# Patient Record
Sex: Female | Born: 1935 | Race: White | Hispanic: No | Smoking: Never smoker
Health system: Southern US, Community
[De-identification: ages and names within clinical notes are randomized; demographics above are authoritative.]

## PROBLEM LIST (undated history)

## (undated) DIAGNOSIS — J189 Pneumonia, unspecified organism: Secondary | ICD-10-CM

## (undated) DIAGNOSIS — F0390 Unspecified dementia without behavioral disturbance: Secondary | ICD-10-CM

## (undated) DIAGNOSIS — I10 Essential (primary) hypertension: Secondary | ICD-10-CM

## (undated) DIAGNOSIS — E039 Hypothyroidism, unspecified: Secondary | ICD-10-CM

## (undated) DIAGNOSIS — E079 Disorder of thyroid, unspecified: Secondary | ICD-10-CM

## (undated) HISTORY — PX: ABDOMINAL HYSTERECTOMY: SHX81

## (undated) HISTORY — PX: APPENDECTOMY: SHX54

---

## 1998-09-10 ENCOUNTER — Encounter: Payer: Self-pay | Admitting: Gastroenterology

## 1998-09-10 ENCOUNTER — Ambulatory Visit (HOSPITAL_COMMUNITY): Admission: RE | Admit: 1998-09-10 | Discharge: 1998-09-10 | Payer: Self-pay | Admitting: Gastroenterology

## 2000-11-28 ENCOUNTER — Encounter: Payer: Self-pay | Admitting: Gastroenterology

## 2004-09-07 ENCOUNTER — Ambulatory Visit: Payer: Self-pay | Admitting: Hematology & Oncology

## 2004-10-20 ENCOUNTER — Ambulatory Visit: Payer: Self-pay | Admitting: Gastroenterology

## 2004-11-07 ENCOUNTER — Ambulatory Visit: Payer: Self-pay | Admitting: Gastroenterology

## 2005-02-11 ENCOUNTER — Emergency Department (HOSPITAL_COMMUNITY): Admission: EM | Admit: 2005-02-11 | Discharge: 2005-02-11 | Payer: Self-pay | Admitting: Family Medicine

## 2006-05-23 ENCOUNTER — Emergency Department (HOSPITAL_COMMUNITY): Admission: EM | Admit: 2006-05-23 | Discharge: 2006-05-23 | Payer: Self-pay | Admitting: Emergency Medicine

## 2006-06-07 ENCOUNTER — Encounter: Admission: RE | Admit: 2006-06-07 | Discharge: 2006-06-07 | Payer: Self-pay | Admitting: Internal Medicine

## 2006-06-12 ENCOUNTER — Encounter: Admission: RE | Admit: 2006-06-12 | Discharge: 2006-06-12 | Payer: Self-pay | Admitting: Internal Medicine

## 2007-05-06 ENCOUNTER — Encounter: Admission: RE | Admit: 2007-05-06 | Discharge: 2007-05-06 | Payer: Self-pay | Admitting: Internal Medicine

## 2007-08-01 ENCOUNTER — Emergency Department (HOSPITAL_COMMUNITY): Admission: EM | Admit: 2007-08-01 | Discharge: 2007-08-01 | Payer: Self-pay | Admitting: Family Medicine

## 2007-10-16 ENCOUNTER — Emergency Department (HOSPITAL_COMMUNITY): Admission: EM | Admit: 2007-10-16 | Discharge: 2007-10-16 | Payer: Self-pay | Admitting: Emergency Medicine

## 2007-12-11 ENCOUNTER — Encounter: Admission: RE | Admit: 2007-12-11 | Discharge: 2007-12-11 | Payer: Self-pay | Admitting: Internal Medicine

## 2009-07-11 ENCOUNTER — Emergency Department (HOSPITAL_COMMUNITY): Admission: EM | Admit: 2009-07-11 | Discharge: 2009-07-11 | Payer: Self-pay | Admitting: Emergency Medicine

## 2009-09-20 ENCOUNTER — Encounter (INDEPENDENT_AMBULATORY_CARE_PROVIDER_SITE_OTHER): Payer: Self-pay | Admitting: *Deleted

## 2010-05-03 ENCOUNTER — Telehealth: Payer: Self-pay | Admitting: Gastroenterology

## 2010-05-12 ENCOUNTER — Encounter (INDEPENDENT_AMBULATORY_CARE_PROVIDER_SITE_OTHER): Payer: Self-pay | Admitting: *Deleted

## 2010-05-29 ENCOUNTER — Emergency Department (HOSPITAL_COMMUNITY): Admission: EM | Admit: 2010-05-29 | Discharge: 2010-05-29 | Payer: Self-pay | Admitting: Family Medicine

## 2010-07-04 ENCOUNTER — Ambulatory Visit: Payer: Self-pay | Admitting: Gastroenterology

## 2010-07-04 DIAGNOSIS — I10 Essential (primary) hypertension: Secondary | ICD-10-CM

## 2010-07-04 DIAGNOSIS — Z860100 Personal history of colon polyps, unspecified: Secondary | ICD-10-CM | POA: Insufficient documentation

## 2010-09-21 ENCOUNTER — Ambulatory Visit: Payer: Self-pay | Admitting: Gastroenterology

## 2010-09-23 ENCOUNTER — Encounter: Payer: Self-pay | Admitting: Gastroenterology

## 2010-11-01 NOTE — Progress Notes (Signed)
Summary: Schedule Office Visit to Discuss Colonoscopy  Phone Note Outgoing Call Call back at [facility] Phone [phone]   Call placed by: Harlow Mares CMA Duncan Dull),  May 03, 2010 4:48 PM Call placed to: Patient Summary of Call: Left message on patients machine to call back. patient is due for an office visit  prior to scheduling her colonoscopy since she is over 21 Initial call taken by: Harlow Mares CMA Duncan Dull),  May 03, 2010 4:48 PM  Follow-up for Phone Call        NP3 scheduled for 07/04/2010 with Dr. Arlyce Dice. Follow-up by: Harlow Mares CMA Duncan Dull),  May 12, 2010 10:24 AM

## 2010-11-01 NOTE — Assessment & Plan Note (Signed)
Summary: discuss recall colon, due to age/lk   History of Present Illness Primary GI MD: Melvia Heaps MD [facility] Primary Sloane Junkin: [name], MD Chief Complaint: Discuss recall colon, pt states is not having any GI problems at this time.  History of Present Illness:   Kristie Gonzales is a pleasant 77 year old white female referred at the request of Dr. Chilton Si for recall colonoscopy.  She has a history of colon polyps and family history of colon cancer.  Her brother developed colon cancer in his 25s.  Kristie Gonzales is no GI complaints except for mild constipation.  She is without change in bowel habits, melena or hematochezia.   GI Review of Systems      Denies abdominal pain, acid reflux, belching, bloating, chest pain, dysphagia with liquids, dysphagia with solids, heartburn, loss of appetite, nausea, vomiting, vomiting blood, weight loss, and  weight gain.      Reports constipation.     Denies anal fissure, black tarry stools, change in bowel habit, diarrhea, diverticulosis, fecal incontinence, heme positive stool, hemorrhoids, irritable bowel syndrome, jaundice, light color stool, liver problems, rectal bleeding, and  rectal pain. Preventive Screening-Counseling & Management  Alcohol-Tobacco     Smoking Status: never      Drug Use:  no.      Current Medications (verified): 1)  Levothyroxine Sodium 100 Mcg Tabs (Levothyroxine Sodium) .... One Tablet By Mouth Once Daily 2)  Hydrochlorothiazide 25 Mg Tabs (Hydrochlorothiazide) .... One Tablet By Mouth Once Daily  Allergies (verified): 1)  ! Sulfa  Past History:  Past Medical History: Adenomatous Polyps 2006 Diverticulosis Asthma Hyperlipidemia Hypertension Hypothyroidism  Past Surgical History: Appendectomy Hysterectomy  Family History: Family History of Colon Cancer:Brother Family History of Diabetes: Son Family History of Heart Disease: Sons Family History of Kidney Disease:Son  Social  History: Widowed Retired Patient has never smoked.  Alcohol Use - no Daily Caffeine Use Illicit Drug Use - no Smoking Status:  never Drug Use:  no  Review of Systems       The patient complains of hearing problems, shortness of breath, and urination changes/pain.  The patient denies allergy/sinus, anemia, anxiety-new, arthritis/joint pain, back pain, blood in urine, breast changes/lumps, change in vision, confusion, cough, coughing up blood, depression-new, fainting, fatigue, fever, headaches-new, heart murmur, heart rhythm changes, itching, menstrual pain, muscle pains/cramps, night sweats, nosebleeds, pregnancy symptoms, skin rash, sleeping problems, sore throat, swelling of feet/legs, swollen lymph glands, thirst - excessive , urination - excessive , urine leakage, vision changes, and voice change.         All other systems were reviewed and were negative   Vital Signs:  Patient profile:   77 year old female Height:      63 inches Weight:      180.38 pounds BMI:     32.07 Pulse rate:   66 / minute Pulse rhythm:   regular BP sitting:   106 / 64  (left arm) Cuff size:   regular  Vitals Entered By: Christie Nottingham CMA Duncan Dull) (July 04, 2010 10:49 AM)  Physical Exam  Additional Exam:  On physical exam she is well-developed well-nourished female  skin: anicteric HEENT: normocephalic; PEERLA; no nasal or pharyngeal abnormalities neck: supple nodes: no cervical lymphadenopathy chest: clear to ausculatation and percussion heart: no murmurs, gallops, or rubs abd: soft, nontender; BS normoactive; no abdominal masses, tenderness, organomegaly rectal: deferred ext: no cynanosis, clubbing, edema skeletal: no deformities neuro: oriented x 3; no focal abnormalities    Impression & Recommendations:  Problem # 1:  PERSONAL HISTORY OF COLONIC POLYPS (ICD-V12.72)  Plan followup colonoscopy  Risks, alternatives, and complications of the procedure, including bleeding,  perforation, and possible need for surgery, were explained to the patient.  Patient's questions were answered.  Orders: Colonoscopy (Colon)  Problem # 2:  FM HX MALIGNANT NEOPLASM GASTROINTESTINAL TRACT (ICD-V16.0)  Orders: Colonoscopy (Colon)  Patient Instructions: 1)  Copy sent to : [name], MD 2)  Your Colonoscopy is scheduled for 08/23/2010 at 10:30am 3)  You can pick up your MoviPrep from your pharmacy today 4)  The medication list was reviewed and reconciled.  All changed / newly prescribed medications were explained.  A complete medication list was provided to the patient / caregiver. Prescriptions: MOVIPREP 100 GM  SOLR (PEG-KCL-NACL-NASULF-NA ASC-C) As per prep instructions.  #1 x 0   Entered by:   Merri Ray CMA (AAMA)   Authorized by:   Louis Meckel MD   Signed by:   Merri Ray CMA (AAMA) on 07/04/2010   Method used:   Electronically to        [id number] (retail)       [address] [place], [place]  [address]       Ph: [phone]       Fax: [phone]   RxID:   [id number]

## 2010-11-01 NOTE — Procedures (Signed)
Summary: EGD   EGD  Procedure date:  11/28/2000  Findings:      Findings: Esophagitis  Location: [facility]   Patient Name: Kristie Gonzales, Kristie Gonzales. MRN:  Procedure Procedures: Panendoscopy (EGD) CPT: 43235.  Personnel: Endoscopist: Barbette Hair. Arlyce Dice, MD.  Exam Location: Exam performed in Outpatient Clinic. Outpatient  Patient Consent: Procedure, Alternatives, Risks and Benefits discussed, consent obtained, from patient.  Indications Symptoms: Reflux symptoms  History  Pre-Exam Physical: Performed Nov 28, 2000  Cardio-pulmonary exam, HEENT exam, Abdominal exam, Extremity exam, Neurological exam, Mental status exam WNL.  Exam Exam Info: Maximum depth of insertion Duodenum, intended Duodenum. ASA Classification: II. Tolerance: excellent.  Sedation Meds: Fentanyl Versed 5 mg. Cetacaine Spray 1 sprays Demerol 50 mg. Robinul 0.2  Monitoring: BP and pulse monitoring done. Oximetry used. Supplemental O2 given  Findings ESOPHAGEAL INFLAMMATION: [place] Classification: Grade A. ICD9: Esophagitis: 530.10.  - MUCOSAL ABNORMALITY: Antrum. Erythematous mucosa.   Assessment Abnormal examination, see findings above.  Diagnoses: 530.10: Esophagitis.   Events  Unplanned Intervention: No unplanned interventions were required.  Unplanned Events: There were no complications. Plans Medication(s): Continue current medications.  Disposition: After procedure patient sent to recovery. After recovery patient sent home.  Scheduling: Office Visit, to [employer]. Arlyce Dice, MD, around Jan 09, 2001.    This report was created from the original endoscopy report, which was reviewed and signed by the above listed endoscopist.

## 2010-11-01 NOTE — Letter (Signed)
Summary: [facility] Instructions  [facility]  [address] [place], [place] [address]   Phone: [phone]  Fax: [phone]       Kristie Gonzales    1926/08/09    MRN: [id number]        Procedure Day /Date:TUESDAY 08/23/2010     Arrival Time:9:30AM     Procedure Time:10:30AM     Location of Procedure:                    X   [facility] (4th Floor)   PREPARATION FOR COLONOSCOPY WITH MOVIPREP   Starting 5 days prior to your procedure10/17/2011 do not eat nuts, seeds, popcorn, corn, beans, peas,  salads, or any raw vegetables.  Do not take any fiber supplements (e.g. Metamucil, Citrucel, and Benefiber).  THE DAY BEFORE YOUR PROCEDURE         DATE: 08/22/2010 DAY: MONDAY  1.  Drink clear liquids the entire day-NO SOLID FOOD  2.  Do not drink anything colored red or purple.  Avoid juices with pulp.  No orange juice.  3.  Drink at least 64 oz. (8 glasses) of fluid/clear liquids during the day to prevent dehydration and help the prep work efficiently.  CLEAR LIQUIDS INCLUDE: Water Jello Ice Popsicles Tea (sugar ok, no milk/cream) Powdered fruit flavored drinks Coffee (sugar ok, no milk/cream) Gatorade Juice: apple, white grape, white cranberry  Lemonade Clear bullion, consomm, broth Carbonated beverages (any kind) Strained chicken noodle soup Hard Candy                             4.  In the morning, mix first dose of MoviPrep solution:    Empty 1 Pouch A and 1 Pouch B into the disposable container    Add lukewarm drinking water to the top line of the container. Mix to dissolve    Refrigerate (mixed solution should be used within 24 hrs)  5.  Begin drinking the prep at 5:00 p.m. The MoviPrep container is divided by 4 marks.   Every 15 minutes drink the solution down to the next mark (approximately 8 oz) until the full liter is complete.   6.  Follow completed prep with 16 oz of clear liquid of your choice (Nothing red or purple).   Continue to drink clear liquids until bedtime.  7.  Before going to bed, mix second dose of MoviPrep solution:    Empty 1 Pouch A and 1 Pouch B into the disposable container    Add lukewarm drinking water to the top line of the container. Mix to dissolve    Refrigerate  THE DAY OF YOUR PROCEDURE      DATE:08/23/2010 DAY: TUESDAY  Beginning at 5:30a.m. (5 hours before procedure):         1. Every 15 minutes, drink the solution down to the next mark (approx 8 oz) until the full liter is complete.  2. Follow completed prep with 16 oz. of clear liquid of your choice.    3. You may drink clear liquids until 8:30AM (2 HOURS BEFORE PROCEDURE).   MEDICATION INSTRUCTIONS  Unless otherwise instructed, you should take regular prescription medications with a small sip of water   as early as possible the morning of your procedure.        OTHER INSTRUCTIONS  You will need a responsible adult at least 75 years of age to accompany you and drive you home.  This person must remain in the waiting room during your procedure.  Wear loose fitting clothing that is easily removed.  Leave jewelry and other valuables at home.  However, you may wish to bring a book to read or  an iPod/MP3 player to listen to music as you wait for your procedure to start.  Remove all body piercing jewelry and leave at home.  Total time from sign-in until discharge is approximately 2-3 hours.  You should go home directly after your procedure and rest.  You can resume normal activities the  day after your procedure.  The day of your procedure you should not:   Drive   Make legal decisions   Operate machinery   Drink alcohol   Return to work  You will receive specific instructions about eating, activities and medications before you leave.    The above instructions have been reviewed and explained to me by   _______________________    I fully understand and can verbalize these instructions  _____________________________ Date _________

## 2010-11-01 NOTE — Procedures (Signed)
Summary: colonoscopy   Colonoscopy  Procedure date:  11/07/2004  Findings:      Results: Polyp.  Results: Diverticulosis.       Location:  [facility].    Comments:      Repeat colonoscopy in 5 years.  Patient Name: Kristie Gonzales, Kristie Gonzales MRN:  Procedure Procedures: Colonoscopy CPT: [phone].    with Hot Biopsy(s)CPT: [id number].  Personnel: Endoscopist: Barbette Hair. Arlyce Dice, MD.  Patient Consent: Procedure, Alternatives, Risks and Benefits discussed, consent obtained, from patient.  Indications  Surveillance of: Adenomatous Polyp(s).  Increased Risk Screening: For family history of colorectal neoplasia, in  sibling  History  Current Medications: Patient is not currently taking Coumadin.  Pre-Exam Physical: Performed Nov 07, 2004. Cardio-pulmonary exam, HEENT exam , Abdominal exam WNL.  Exam Exam: Extent of exam reached: Cecum, extent intended: Cecum.  The cecum was identified by IC valve. Colon retroflexion performed. ASA Classification: II. Tolerance: good.  Monitoring: Pulse and BP monitoring, Oximetry used. Supplemental O2 given. at 2 Liters.  Colon Prep Used Miralax for colon prep. Prep results: good.  Sedation Meds: Patient assessed and found to be appropriate for moderate (conscious) sedation. Sedation was managed by the Endoscopist. Fentanyl 50 mcg. given IV. Versed 6 mg. given IV.  Findings POLYP: Descending Colon, Maximum size: 3 mm. Procedure:  hot biopsy, ICD9: Colon Polyps: 211.3.  - DIVERTICULOSIS: Descending Colon to Sigmoid Colon. ICD9: Diverticulosis: 562.10. Comments: Diffuse diverticular changes.  NORMAL EXAM: Cecum.  NORMAL EXAM: Rectum.   Assessment Abnormal examination, see findings above.  Diagnoses: 211.3: Colon Polyps.  562.10: Diverticulosis.   Events  Unplanned Interventions: No intervention was required.  Unplanned Events: There were no complications. Plans  Post Exam Instructions: Post sedation instructions given.    Patient Education: Patient given standard instructions for: Polyps. Diverticulosis.  Scheduling/Referral: Colonoscopy, to Barbette Hair. Arlyce Dice, MD, around Nov 07, 2009.    This report was created from the original endoscopy report, which was reviewed and signed by the above listed endoscopist.

## 2010-11-01 NOTE — Letter (Signed)
Summary: New Patient letter  [facility]  [address] [place], [place] [address]   Phone: [phone]  Fax: [phone]       05/12/2010 MRN: [id number]  [facility] [address] [place], [place]  [address]  Dear Ms. Laural Benes,  Welcome to the Gastroenterology Division at [facility].    You are scheduled to see Dr. Arlyce Dice on 07/04/2010 at 11:00am on the 3rd floor at [facility], [address]  We ask that you try to arrive at our office 15 minutes prior to your appointment time to allow for check-in.  We would like you to complete the enclosed self-administered evaluation form prior to your visit and bring it with you on the day of your appointment.  We will review it with you.  Also, please bring a complete list of all your medications or, if you prefer, bring the medication bottles and we will list them.  Please bring your insurance card so that we may make a copy of it.  If your insurance requires a referral to see a specialist, please bring your referral form from your primary care physician.  Co-payments are due at the time of your visit and may be paid by cash, check or credit card.     Your office visit will consist of a consult with your physician (includes a physical exam), any laboratory testing he/she may order, scheduling of any necessary diagnostic testing (e.g. x-ray, ultrasound, CT-scan), and scheduling of a procedure (e.g. Endoscopy, Colonoscopy) if required.  Please allow enough time on your schedule to allow for any/all of these possibilities.    If you cannot keep your appointment, please call [phone] to cancel or reschedule prior to your appointment date.  This allows [place] the opportunity to schedule an appointment for another patient in need of care.  If you do not cancel or reschedule by 5 p.m. the business day prior to your appointment date, you will be charged a $50.00 late cancellation/no-show fee.    Thank you for choosing  [facility] for your medical needs.  We appreciate the opportunity to care for you.  Please visit [place] at our website  to learn more about our practice.                     Sincerely,                                                             The Gastroenterology Division

## 2010-11-03 NOTE — Letter (Signed)
Summary: Patient Notice- Polyp Results  [facility]  [address] [place], [place] [address]   Phone: [phone]  Fax: [phone]        September 23, 2010 MRN: [id number]    [facility] [address] [place], [place]  [address]    Dear Ms. Laural Benes,  I am pleased to inform you that the colon polyp(s) removed during your recent colonoscopy was (were) found to be benign (no cancer detected) upon pathologic examination.  I recommend you have a repeat colonoscopy examination in 5_ years to look for recurrent polyps, as having colon polyps increases your risk for having recurrent polyps or even colon cancer in the future.  Should you develop new or worsening symptoms of abdominal pain, bowel habit changes or bleeding from the rectum or bowels, please schedule an evaluation with either your primary care physician or with me.  Additional information/recommendations:  __ No further action with gastroenterology is needed at this time. Please      follow-up with your primary care physician for your other healthcare      needs.  __ Please call [phone] to schedule a return visit to review your      situation.  __ Please keep your follow-up visit as already scheduled.  _x_ Continue treatment plan as outlined the day of your exam.  Please call us if you are having persistent problems or have questions about your condition that have not been fully answered at this time.  Sincerely,  Louis Meckel MD  This letter has been electronically signed by your physician.  Appended Document: Patient Notice- Polyp Results Letter Mailed

## 2010-11-03 NOTE — Procedures (Signed)
Summary: Colonoscopy  Patient: [name] Note: All result statuses are Final unless otherwise noted.  Tests: (1) Colonoscopy (COL)   COL Colonoscopy           DONE     [facility]     [address]     [place], [place]  [address]           COLONOSCOPY PROCEDURE REPORT           PATIENT:  Kristie, Gonzales  MR#:  [id number]     BIRTHDATE:  January 22, 1926, 84 yrs. old  GENDER:  female           ENDOSCOPIST:  Barbette Hair. Arlyce Dice, MD     Referred by:  [name], M.D.           PROCEDURE DATE:  09/21/2010     PROCEDURE:  Colonoscopy with snare polypectomy     ASA CLASS:  Class II     INDICATIONS:  1) screening  2) family history of colon cancer     (brother)  3) history of pre-cancerous (adenomatous) colon polyps;     index polypectomy 2006           MEDICATIONS:   Fentanyl 50 mcg IV, Versed 5 mg IV           DESCRIPTION OF PROCEDURE:   After the risks benefits and     alternatives of the procedure were thoroughly explained, informed     consent was obtained.  Digital rectal exam was performed and     revealed no abnormalities.   The LB [id number] endoscope     was introduced through the anus and advanced to the cecum, which     was identified by both the appendix and ileocecal valve, without     limitations.  The quality of the prep was excellent, using     MoviPrep.  The instrument was then slowly withdrawn as the colon     was fully examined.     <<PROCEDUREIMAGES>>           FINDINGS:  A sessile polyp was found in the ascending colon. It     was 10 mm in size. Polyp was snared without cautery. Retrieval was     successful (see image6). snare polyp  A sessile polyp was found in     the sigmoid colon. It was 12 mm in size. It was found 20 cm from     the point of entry. Polyp was snared without cautery. Retrieval     was successful (see image12). snare polyp  Moderate diverticulosis     was found in the sigmoid colon (see image1). Moderate to severe  diverticular changes  This was otherwise a normal examination of     the colon (see image2, image3, image7, image8, image9, and     image13).   Retroflexed views in the rectum revealed no     abnormalities.    The time to cecum =  4.0  minutes. The scope was     then withdrawn (time =  9.50  min) from the patient and the     procedure completed.           COMPLICATIONS:  None           ENDOSCOPIC IMPRESSION:     1) 10 mm sessile polyp in the ascending colon     2) 12 mm sessile polyp in the sigmoid colon     3)  Moderate diverticulosis in the sigmoid colon     4) Otherwise normal examination     RECOMMENDATIONS:     1) Await biopsy results           REPEAT EXAM:   You will receive a letter from Dr. Arlyce Dice in 1-2     weeks, after reviewing the final pathology, with followup     recommendations.           ______________________________     Barbette Hair Arlyce Dice, MD           CC:           n.     eSIGNED:   Barbette Hair. Kaplan at 09/21/2010 11:32 AM           Donalda Ewings, [phone]  Note: An exclamation mark (!) indicates a result that was not dispersed into the flowsheet. Document Creation Date: 09/21/2010 11:33 AM _______________________________________________________________________  (1) Order result status: Final Collection or observation date-time: 09/21/2010 11:26 Requested date-time:  Receipt date-time:  Reported date-time:  Referring Physician:   Ordering Physician: Melvia Heaps [phone]) Specimen Source:  Source: Launa Grill Order Number: [id number] Lab site:   Appended Document: Colonoscopy f/u colonoscopy in 5 years  Appended Document: Colonoscopy     Procedures Next Due Date:    Colonoscopy: 09/2015

## 2011-06-22 LAB — DIFFERENTIAL
Basophils Absolute: 0
Basophils Relative: 0
Eosinophils Absolute: 0.2
Eosinophils Relative: 2
Monocytes Absolute: 0.6
Monocytes Relative: 4

## 2011-06-22 LAB — CBC
HCT: 41.9
Hemoglobin: 14.4
MCHC: 34.3
MCV: 91.4
RBC: 4.58
RDW: 13.8

## 2011-06-22 LAB — I-STAT 8, (EC8 V) (CONVERTED LAB)
Acid-Base Excess: 1
Bicarbonate: 23.9
Glucose, Bld: 102 — ABNORMAL HIGH
Potassium: 4.5
TCO2: 25
pCO2, Ven: 32.3 — ABNORMAL LOW
pH, Ven: 7.478 — ABNORMAL HIGH

## 2011-06-22 LAB — URINALYSIS, ROUTINE W REFLEX MICROSCOPIC
Glucose, UA: NEGATIVE
Leukocytes, UA: NEGATIVE
Protein, ur: NEGATIVE
Specific Gravity, Urine: 1.027

## 2011-06-22 LAB — URINE MICROSCOPIC-ADD ON

## 2012-04-19 ENCOUNTER — Inpatient Hospital Stay (HOSPITAL_COMMUNITY)
Admission: EM | Admit: 2012-04-19 | Discharge: 2012-04-23 | DRG: 193 | Disposition: A | Discharge status: Skilled Nursing Facility | Payer: Medicare Other | Attending: Internal Medicine | Admitting: Internal Medicine

## 2012-04-19 ENCOUNTER — Ambulatory Visit (INDEPENDENT_AMBULATORY_CARE_PROVIDER_SITE_OTHER): Payer: Medicare Other | Admitting: Emergency Medicine

## 2012-04-19 ENCOUNTER — Ambulatory Visit: Payer: Medicare Other

## 2012-04-19 ENCOUNTER — Encounter (HOSPITAL_COMMUNITY): Payer: Self-pay

## 2012-04-19 VITALS — BP 120/72 | HR 109 | Temp 99.1°F | Resp 16 | Ht 62.0 in | Wt 157.6 lb

## 2012-04-19 DIAGNOSIS — J189 Pneumonia, unspecified organism: Secondary | ICD-10-CM

## 2012-04-19 DIAGNOSIS — G929 Unspecified toxic encephalopathy: Secondary | ICD-10-CM | POA: Diagnosis present

## 2012-04-19 DIAGNOSIS — I129 Hypertensive chronic kidney disease with stage 1 through stage 4 chronic kidney disease, or unspecified chronic kidney disease: Secondary | ICD-10-CM | POA: Diagnosis present

## 2012-04-19 DIAGNOSIS — Z86010 Personal history of colonic polyps: Secondary | ICD-10-CM

## 2012-04-19 DIAGNOSIS — G92 Toxic encephalopathy: Secondary | ICD-10-CM | POA: Diagnosis present

## 2012-04-19 DIAGNOSIS — N179 Acute kidney failure, unspecified: Secondary | ICD-10-CM | POA: Diagnosis present

## 2012-04-19 DIAGNOSIS — N183 Chronic kidney disease, stage 3 unspecified: Secondary | ICD-10-CM | POA: Diagnosis present

## 2012-04-19 DIAGNOSIS — R0902 Hypoxemia: Secondary | ICD-10-CM | POA: Diagnosis present

## 2012-04-19 DIAGNOSIS — R0989 Other specified symptoms and signs involving the circulatory and respiratory systems: Secondary | ICD-10-CM

## 2012-04-19 DIAGNOSIS — E039 Hypothyroidism, unspecified: Secondary | ICD-10-CM | POA: Diagnosis present

## 2012-04-19 DIAGNOSIS — Z882 Allergy status to sulfonamides status: Secondary | ICD-10-CM

## 2012-04-19 DIAGNOSIS — I10 Essential (primary) hypertension: Secondary | ICD-10-CM

## 2012-04-19 HISTORY — DX: Pneumonia, unspecified organism: J18.9

## 2012-04-19 HISTORY — DX: Hypothyroidism, unspecified: E03.9

## 2012-04-19 HISTORY — DX: Disorder of thyroid, unspecified: E07.9

## 2012-04-19 HISTORY — DX: Essential (primary) hypertension: I10

## 2012-04-19 LAB — CBC
HCT: 40.3 % (ref 36.0–46.0)
MCH: 31.1 pg (ref 26.0–34.0)
MCHC: 35 g/dL (ref 30.0–36.0)
MCHC: 34.9 g/dL (ref 30.0–36.0)
MCV: 89.6 fL (ref 78.0–100.0)
Platelets: 359 10*3/uL (ref 150–400)
RDW: 13.2 % (ref 11.5–15.5)
RDW: 13.3 % (ref 11.5–15.5)

## 2012-04-19 LAB — BASIC METABOLIC PANEL
BUN: 71 mg/dL — ABNORMAL HIGH (ref 6–23)
Creatinine, Ser: 2.78 mg/dL — ABNORMAL HIGH (ref 0.50–1.10)
GFR calc Af Amer: 17 mL/min — ABNORMAL LOW (ref >90)
GFR calc non Af Amer: 15 mL/min — ABNORMAL LOW (ref >90)
Potassium: 4.1 mEq/L (ref 3.5–5.1)

## 2012-04-19 LAB — LACTIC ACID, PLASMA: Lactic Acid, Venous: 1.4 mmol/L (ref 0.5–2.2)

## 2012-04-19 MED ORDER — AZITHROMYCIN 500 MG PO TABS
500.0000 mg | ORAL_TABLET | Freq: Every day | ORAL | Status: DC
Start: 2012-04-19 — End: 2012-04-23
  Administered 2012-04-20 – 2012-04-22 (×4): 500 mg via ORAL
  Filled 2012-04-19 (×6): qty 1

## 2012-04-19 MED ORDER — DEXTROSE 5 % IV SOLN: 500.0000 mg | Freq: Once | INTRAVENOUS | Status: DC

## 2012-04-19 MED ORDER — AZITHROMYCIN 250 MG PO TABS: ORAL_TABLET | ORAL | Status: DC

## 2012-04-19 MED ORDER — DEXTROSE 5 % IV SOLN
1.0000 g | Freq: Once | INTRAVENOUS | Status: AC
  Administered 2012-04-19: 1 g via INTRAVENOUS
  Filled 2012-04-19: qty 10

## 2012-04-19 MED ORDER — ENOXAPARIN SODIUM 30 MG/0.3ML ~~LOC~~ SOLN
30.0000 mg | SUBCUTANEOUS | Status: DC
  Administered 2012-04-20 – 2012-04-23 (×4): 30 mg via SUBCUTANEOUS
  Filled 2012-04-19 (×4): qty 0.3

## 2012-04-19 MED ORDER — DEXTROSE 5 % IV SOLN
1.0000 g | INTRAVENOUS | Status: DC
  Administered 2012-04-20 – 2012-04-22 (×2): 1 g via INTRAVENOUS
  Filled 2012-04-19 (×4): qty 10

## 2012-04-19 MED ORDER — SODIUM CHLORIDE 0.9 % IV BOLUS (SEPSIS)
1000.0000 mL | Freq: Once | INTRAVENOUS | Status: AC
  Administered 2012-04-19: 500 mL via INTRAVENOUS

## 2012-04-19 MED ORDER — LEVOTHYROXINE SODIUM 100 MCG PO TABS
100.0000 ug | ORAL_TABLET | Freq: Every day | ORAL | Status: DC
  Administered 2012-04-20 – 2012-04-23 (×4): 100 ug via ORAL
  Filled 2012-04-19 (×5): qty 1

## 2012-04-19 MED ORDER — LEVOFLOXACIN 500 MG PO TABS: 500.0000 mg | ORAL_TABLET | Freq: Every day | ORAL | Status: DC

## 2012-04-19 MED ORDER — SODIUM CHLORIDE 0.9 % IV SOLN
INTRAVENOUS | Status: DC
  Administered 2012-04-19: 23:00:00 via INTRAVENOUS
  Administered 2012-04-20: 75 mL via INTRAVENOUS
  Administered 2012-04-21: 02:00:00 via INTRAVENOUS

## 2012-04-19 NOTE — ED Notes (Signed)
IV team paged to attempt PIV 

## 2012-04-19 NOTE — ED Notes (Signed)
Pt here with dx pna from [facility], sts cough with green sputum.

## 2012-04-19 NOTE — Progress Notes (Signed)
  Subjective:    Patient ID: Kristie Gonzales, female    DOB: May 15, 1926, 78 y.o.   MRN: [id number]  Sore Throat  This is a new problem. The current episode started in the past 7 days. The problem has been gradually worsening. There has been no fever. The pain is at a severity of 2/10. The pain is mild. Associated symptoms include coughing, diarrhea and a hoarse voice. Pertinent negatives include no abdominal pain, congestion, drooling, ear discharge, ear pain, headaches, plugged ear sensation, neck pain, shortness of breath, stridor, swollen glands, trouble swallowing or vomiting. She has tried nothing for the symptoms.  Cough This is a new problem. The current episode started in the past 7 days. The problem has been gradually worsening. The problem occurs constantly. The cough is productive of purulent sputum. Associated symptoms include a sore throat. Pertinent negatives include no chest pain, chills, ear congestion, ear pain, fever, headaches, heartburn, hemoptysis, myalgias, nasal congestion, postnasal drip, rash, rhinorrhea, shortness of breath, sweats, weight loss or wheezing. Nothing aggravates the symptoms. She has tried nothing for the symptoms. There is no history of asthma, bronchiectasis, bronchitis, COPD, emphysema, environmental allergies or pneumonia.      Review of Systems  Constitutional: Positive for activity change and appetite change. Negative for fever, chills and weight loss.  HENT: Positive for sore throat and hoarse voice. Negative for ear pain, congestion, rhinorrhea, drooling, trouble swallowing, neck pain, postnasal drip and ear discharge.   Eyes: Negative.   Respiratory: Positive for cough. Negative for hemoptysis, shortness of breath, wheezing and stridor.   Cardiovascular: Negative for chest pain.  Gastrointestinal: Positive for diarrhea. Negative for heartburn, vomiting and abdominal pain.  Genitourinary: Negative.   Musculoskeletal: Negative for myalgias.  Skin:  Negative for rash.  Neurological: Negative for headaches.  Hematological: Negative for environmental allergies.       Objective:   Physical Exam  Constitutional: She is oriented to person, place, and time. She appears well-developed and well-nourished.  HENT:  Head: Normocephalic and atraumatic.  Eyes: Conjunctivae are normal. Pupils are equal, round, and reactive to light.  Neck: Neck supple.  Cardiovascular: Normal rate and regular rhythm.   Pulmonary/Chest: Effort normal. She has rales.  Abdominal: Soft. There is no tenderness.  Musculoskeletal: Normal range of motion.  Neurological: She is alert and oriented to person, place, and time.  Skin: Skin is warm and dry.          Assessment & Plan:  Cough cxr  [facility] reading (PRIMARY) by  Dr. Dareen Piano.  Bibasilar pneumonia.  Dual antibiotics Advised strongly to accept hospital admission.  Absolutely refused.  Will follow up in ER if no improvemtn.

## 2012-04-19 NOTE — ED Provider Notes (Signed)
History     CSN: [id number]  Arrival date & time 04/19/12  1500   First MD Initiated Contact with Patient 04/19/12 1721      Chief Complaint  Patient presents with  . Pneumonia    (Consider location/radiation/quality/duration/timing/severity/associated sxs/prior treatment) Patient is a 78 y.o. female presenting with URI. The history is provided by the patient, a relative and medical records.  URI The primary symptoms include fever and cough. Primary symptoms do not include fatigue, headaches, sore throat, abdominal pain, nausea, vomiting, myalgias, arthralgias or rash. The current episode started 6 to 7 days ago. This is a new problem. The problem has been gradually worsening.  Maximum temperature: possible, this morning; unknown temperature.  The cough is productive. The sputum is green.  Symptoms associated with the illness include congestion and rhinorrhea. The illness is not associated with chills or facial pain. Risk factors for severe complications from URI include being elderly.    Past Medical History  Diagnosis Date  . Thyroid disease   . Hypertension     No past surgical history on file.  No family history on file.  History  Substance Use Topics  . Smoking status: Never Smoker   . Smokeless tobacco: Not on file  . Alcohol Use: Not on file    OB History    Grav Para Term Preterm Abortions TAB SAB Ect Mult Living                  Review of Systems  Constitutional: Positive for fever and activity change (less active). Negative for chills and fatigue.  HENT: Positive for congestion and rhinorrhea. Negative for sore throat and neck pain.   Respiratory: Positive for cough. Negative for chest tightness and shortness of breath.   Cardiovascular: Negative for chest pain and palpitations.  Gastrointestinal: Negative for nausea, vomiting and abdominal pain.  Musculoskeletal: Negative for myalgias, back pain and arthralgias.  Skin: Negative for color change and  rash.  Neurological: Negative for light-headedness and headaches.  All other systems reviewed and are negative.    Allergies  Sulfonamide derivatives  Home Medications   Current Outpatient Rx  Name Route Sig Dispense Refill  . AZITHROMYCIN 250 MG PO TABS  Take 2 tabs PO x 1 dose, then 1 tab PO QD x 4 days 6 tablet 0  . HYDROCHLOROTHIAZIDE 25 MG PO TABS Oral Take 25 mg by mouth daily.    Marland Kitchen LEVOFLOXACIN 500 MG PO TABS Oral Take 1 tablet (500 mg total) by mouth daily. 7 tablet 0  . LEVOTHYROXINE SODIUM 100 MCG PO TABS Oral Take 100 mcg by mouth daily.    Marland Kitchen LOPERAMIDE HCL 2 MG PO CAPS Oral Take 2 mg by mouth 4 (four) times daily as needed. diarrhea      BP 142/62  Pulse 105  Temp 98.6 F (37 C) (Oral)  Resp 18  SpO2 95%  Physical Exam  Nursing note and vitals reviewed. Constitutional: She is oriented to person, place, and time. She appears well-developed and well-nourished. No distress.       Frequent cough  HENT:  Head: Normocephalic and atraumatic.  Eyes: Pupils are equal, round, and reactive to light.  Cardiovascular: Normal rate, regular rhythm, normal heart sounds and intact distal pulses.   Pulmonary/Chest: Effort normal. No respiratory distress. She has rhonchi in the right lower field and the left lower field.  Abdominal: Soft. She exhibits no distension. There is no tenderness.  Lymphadenopathy:    She has no cervical  adenopathy.  Neurological: She is alert and oriented to person, place, and time.  Skin: Skin is warm and dry.  Psychiatric: She has a normal mood and affect.    ED Course  Procedures (including critical care time)  Labs Reviewed  CBC - Abnormal; Notable for the following:    WBC 29.1 (*)     All other components within normal limits  BASIC METABOLIC PANEL - Abnormal; Notable for the following:    Sodium 134 (*)     Chloride 91 (*)     Glucose, Bld 131 (*)     BUN 71 (*)     Creatinine, Ser 2.78 (*)     GFR calc non Af Amer 15 (*)     GFR  calc Af Amer 17 (*)     All other components within normal limits  CBC - Abnormal; Notable for the following:    WBC 27.1 (*)     All other components within normal limits  CREATININE, SERUM - Abnormal; Notable for the following:    Creatinine, Ser 2.40 (*)     GFR calc non Af Amer 18 (*)     GFR calc Af Amer 20 (*)     All other components within normal limits  LACTIC ACID, PLASMA  DIFFERENTIAL  CULTURE, BLOOD (ROUTINE X 2)  CULTURE, BLOOD (ROUTINE X 2)  CULTURE, EXPECTORATED SPUTUM-ASSESSMENT  GRAM STAIN  LEGIONELLA ANTIGEN, URINE  STREP PNEUMONIAE URINARY ANTIGEN  COMPREHENSIVE METABOLIC PANEL  URINALYSIS, ROUTINE W REFLEX MICROSCOPIC  URINE CULTURE  SODIUM, URINE, RANDOM  CREATININE, URINE, RANDOM   Dg Chest 2 View  04/19/2012  *RADIOLOGY REPORT*  Clinical Data: Cough and congestion.  CHEST - 2 VIEW  Comparison: 12/11/2007 CT.  10/16/2007 chest x-ray.  Findings: Parenchymal changes lung bases, lingula and middle lobe as well as peripheral aspect right upper lobe.  Some of these findings were noted previously and may represent scarring. Superimposed basilar pneumonia may be present as noted on preliminary report.  Follow-up to establish the patient's baseline and exclude underlying lesion recommended.  Heart slightly enlarged.  IMPRESSION: Parenchymal changes lung bases, lingula and middle lobe as well as peripheral aspect right upper lobe.  Some of these findings were noted previously and may represent scarring.  Superimposed basilar pneumonia may be present as noted on preliminary report.  Follow-up to establish the patient's baseline and exclude underlying lesion recommended.  Heart slightly enlarged.  Clinically significant discrepancy from primary report, if provided: Follow up as noted above  Original Report Authenticated By: Fuller Canada, M.D.     1. Community acquired pneumonia   2. Hypoxia       MDM  78 year old female presents today with URI symptoms since the 13th.  The daughter states she called the patient that day noticed that she seemed slightly congested. His daughter talked to the patient throughout the week she seemed to be getting progressively worse. The patient states that she's been coughing up some green sputum but has otherwise felt normal, denies any fevers or shortness of breath or any other complaints. The daughter states that when she was eating with the patient on the phone today she did not seem as "spry" as she normally does so the daughter came to take the patient to urgent care. The patient was found to have an O2 sat of 88%, and a chest x-ray concerning for left lower lobe pneumonia. The patient was given azithromycin and Levaquin, and because she initially refused to come to  the hospital. She did take a dose of these medications prior to coming here, when the daughter for stricture, this afternoon. The patient denies any symptoms at this point in time. Her sats here are 90% on room air and improved to the mid 90s 2 L of oxygen, and she is mildly tachycardic. She has mild crackles in the left lung base. Review of the x-ray does show concern for left lower lobe pneumonia. Check lab work and due to the patient's oxygen requirement she will need to be admitted. The patient lives at home, and has not been admitted to the hospital in the past 3 months, so will treat for community-acquired pneumonia.   Theotis Burrow, MD 04/20/12 [phone]

## 2012-04-19 NOTE — H&P (Signed)
PCP:  [name]    Chief Complaint:  Cough  HPI: Kristie Gonzales is a 78 y.o. female   has a past medical history of Thyroid disease and Hypertension.   Presented with  1 week history of cough, trouble breathing, cold like symptoms hoarseness, this Am she has been confused. Daughter took her to PCP who diagnosed her with PNA and started on Z-pack and Levaquin this am she took the first dose and then was brought to ER. Family reports subjective fever. No chest pain.  She is at home taking care of disabled son so she was reluctant to come in right away.    Review of Systems:    Pertinent positives include: Fevers, chills, fatigue, shortness of breath at rest. productive cough, decreased episode  Constitutional:  No weight loss, night sweats, weight loss  HEENT:  No headaches, Difficulty swallowing,Tooth/dental problems,Sore throat,  No sneezing, itching, ear ache, nasal congestion, post nasal drip,  Cardio-vascular:  No chest pain, Orthopnea, PND, anasarca, dizziness, palpitations.no Bilateral lower extremity swelling  GI:  No heartburn, indigestion, abdominal pain, nausea, vomiting, diarrhea, change in bowel habits, loss of appetite, melena, blood in stool, hematemesis Resp:  no  No dyspnea on exertion, No excess mucus, no No non-productive cough, No coughing up of blood.No change in color of mucus.No wheezing. Skin:  no rash or lesions. No jaundice GU:  no dysuria, change in color of urine, no urgency or frequency. No straining to urinate.  No flank pain.  Musculoskeletal:  No joint pain or no joint swelling. No decreased range of motion. No back pain.  Psych:  No change in mood or affect. No depression or anxiety. No memory loss.  Neuro: no localizing neurological complaints, no tingling, no weakness, no double vision, no gait abnormality, no slurred speech, no confusion  Otherwise ROS are negative except for above, 10 systems were reviewed  Past Medical History: Past  Medical History  Diagnosis Date  . Thyroid disease   . Hypertension    Past Surgical History  Procedure Date  . Abdominal hysterectomy   . Appendectomy      Medications: Prior to Admission medications   Medication Sig Start Date End Date Taking? Authorizing Provider  azithromycin (ZITHROMAX) 250 MG tablet Take 2 tabs PO x 1 dose, then 1 tab PO QD x 4 days 04/19/12 04/24/12 Yes Phillips Odor, MD  hydrochlorothiazide (HYDRODIURIL) 25 MG tablet Take 25 mg by mouth daily.   Yes Historical Provider, MD  levofloxacin (LEVAQUIN) 500 MG tablet Take 1 tablet (500 mg total) by mouth daily. 04/19/12 04/29/12 Yes Phillips Odor, MD  levothyroxine (SYNTHROID, LEVOTHROID) 100 MCG tablet Take 100 mcg by mouth daily.   Yes Historical Provider, MD  loperamide (IMODIUM) 2 MG capsule Take 2 mg by mouth 4 (four) times daily as needed. diarrhea   Yes Historical Provider, MD    Allergies:   Allergies  Allergen Reactions  . 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 She does not have any smokeless tobacco history on file. She reports that she does not drink alcohol or use illicit drugs.   Family History: family history includes Diabetes in her son and Heart disease in her son.    Physical Exam: Patient Vitals for the past 24 hrs:  BP Temp Temp src Pulse Resp SpO2  04/19/12 2033 124/49 mmHg - -  92  24  98 %  04/19/12 1927 - 99.7 F (37.6 C) Oral - - -  04/19/12 1846 142/62 mmHg 98.6 F (37 C) Oral 105  18  95 %  04/19/12 1512 117/52 mmHg 98.9 F (37.2 C) - 116  18  90 %    1. General:  in No Acute distress 2. Psychological: Alert and Oriented 3. Head/ENT:   Dry Mucous Membranes                          Head Non traumatic, neck supple                          Normal Dentition 4. SKIN: normal  Skin turgor,  Skin clean Dry and intact no rash 5. Heart: Regular  rate and rhythm no Murmur, Rub or gallop 6. Lungs:  no wheezes coarse crackles  noted at the bases and is 7. Abdomen: Soft, non-tender, Non distended 8. Lower extremities: no clubbing, cyanosis, or edema 9. Neurologically Grossly intact, moving all 4 extremities equally 10. MSK: Normal range of motion  body mass index is unknown because there is no height or weight on file.   Labs on Admission:   [facility] 04/19/12 1830  NA 134*  K 4.1  CL 91*  CO2 26  GLUCOSE 131*  BUN 71*  CREATININE 2.78*  CALCIUM 9.8  MG --  PHOS --   No results found for this basename: AST:2,ALT:2,ALKPHOS:2,BILITOT:2,PROT:2,ALBUMIN:2 in the last 72 hours No results found for this basename: LIPASE:2,AMYLASE:2 in the last 72 hours  Basename 04/19/12 1830  WBC 29.1*  NEUTROABS --  HGB 14.1  HCT 40.3  MCV 89.6  PLT 359   No results found for this basename: CKTOTAL:3,CKMB:3,CKMBINDEX:3,TROPONINI:3 in the last 72 hours No results found for this basename: TSH,T4TOTAL,FREET3,T3FREE,THYROIDAB in the last 72 hours No results found for this basename: VITAMINB12:2,FOLATE:2,FERRITIN:2,TIBC:2,IRON:2,RETICCTPCT:2 in the last 72 hours No results found for this basename: HGBA1C    The CrCl is unknown because both a height and weight (above a minimum accepted value) are required for this calculation. ABG    Component Value Date/Time   HCO3 23.9 10/16/2007 1404   TCO2 25 10/16/2007 1404     No results found for this basename: DDIMER    Cultures: No results found for this basename: sdes, specrequest, cult, reptstatus       Radiological Exams on Admission: Dg Chest 2 View  04/19/2012  *RADIOLOGY REPORT*  Clinical Data: Cough and congestion.  CHEST - 2 VIEW  Comparison: 12/11/2007 CT.  10/16/2007 chest x-ray.  Findings: Parenchymal changes lung bases, lingula and middle lobe as well as peripheral aspect right upper lobe.  Some of these findings were noted previously and may represent scarring. Superimposed  basilar pneumonia may be present as noted on preliminary report.  Follow-up to establish the patient's baseline and exclude underlying lesion recommended.  Heart slightly enlarged.  IMPRESSION: Parenchymal changes lung bases, lingula and middle lobe as well as peripheral aspect right upper lobe.  Some of these findings were noted previously and may represent scarring.  Superimposed basilar pneumonia may be present as noted on preliminary report.  Follow-up to establish the patient's baseline and exclude underlying lesion recommended.  Heart slightly enlarged.  Clinically significant discrepancy from primary report, if provided: Follow up as noted above  Original Report Authenticated By: Fuller Canada, M.D.    Chart has been reviewed  Assessment/Plan  78 year old female with past history significant for mild hypertension and thyroid disease presents a week history of cough with pneumonia findings and chest x-ray.  Present on Admission:  .Community acquired pneumonia -  - will admit for treatment of CAP will start on appropriate antibiotic coverage.   Obtain sputum cultures, blood cultures if febrile or if decompensates.  Provide oxygen as needed.   .Essential hypertension, benign - hold hydrochlorothiazide  .Acute kidney failure - - likely secondary to dehydration, check FeNA and if not improved with IVF consider renal consult.  would obtain renal US. Of note in 2009 her creatinine was 1.5   Prophylaxis:  Lovenox,   CODE STATUS: DNI Limited code patient family stated she did not want to be on life-support machines but okay to give the medications if her heart stops  Other plan as per orders.  I have spent a total of 60 min on this admission  Jayvon Mounger 04/19/2012, 8:56 PM

## 2012-04-19 NOTE — ED Notes (Signed)
Alert, NAD, calm, interactive, skin W&D, resps e/u, speaking in clear short answers, no increased wob, daughter answering questions for pt, denies pain or sx, congested cough noted, IVF infusing.

## 2012-04-19 NOTE — ED Notes (Signed)
Report called to Jomarie Longs, RN on Dept [phone]

## 2012-04-19 NOTE — ED Provider Notes (Signed)
Seen at urgent care Center earlier today diagnosed with pneumonia complains of cough onset approximately 6 days ago no known fever no other complaint on exam no respiratory distress, coughing frequently lungs Rales at bases bilaterally no use of accessory muscles. Medical decision making patient has oxygen requirement plan admit oxygen therapy, antibiotics Community-acquired pneumonia  Doug Sou, MD 04/19/12 1913

## 2012-04-19 NOTE — ED Notes (Signed)
No changes, alert, NAD, calm, interactive, skin W&D, resps e/u. Up to floor with EMT.

## 2012-04-20 ENCOUNTER — Encounter (HOSPITAL_COMMUNITY): Payer: Self-pay | Admitting: General Practice

## 2012-04-20 ENCOUNTER — Inpatient Hospital Stay (HOSPITAL_COMMUNITY): Payer: Medicare Other

## 2012-04-20 LAB — URINALYSIS, ROUTINE W REFLEX MICROSCOPIC
Bilirubin Urine: NEGATIVE
Glucose, UA: NEGATIVE mg/dL
Protein, ur: NEGATIVE mg/dL
Specific Gravity, Urine: 1.019 (ref 1.005–1.030)
Urobilinogen, UA: 0.2 mg/dL (ref 0.0–1.0)

## 2012-04-20 LAB — COMPREHENSIVE METABOLIC PANEL
ALT: 12 U/L (ref 0–35)
AST: 19 U/L (ref 0–37)
Alkaline Phosphatase: 129 U/L — ABNORMAL HIGH (ref 39–117)
CO2: 25 mEq/L (ref 19–32)
Chloride: 98 mEq/L (ref 96–112)
GFR calc Af Amer: 24 mL/min — ABNORMAL LOW (ref >90)
GFR calc non Af Amer: 21 mL/min — ABNORMAL LOW (ref >90)
Glucose, Bld: 108 mg/dL — ABNORMAL HIGH (ref 70–99)
Potassium: 4 mEq/L (ref 3.5–5.1)
Sodium: 138 mEq/L (ref 135–145)
Total Bilirubin: 0.5 mg/dL (ref 0.3–1.2)

## 2012-04-20 LAB — URINE MICROSCOPIC-ADD ON

## 2012-04-20 LAB — DIFFERENTIAL
Basophils Absolute: 0 10*3/uL (ref 0.0–0.1)
Eosinophils Absolute: 0.3 10*3/uL (ref 0.0–0.7)
Lymphocytes Relative: 5 % — ABNORMAL LOW (ref 12–46)
Monocytes Absolute: 1.4 10*3/uL — ABNORMAL HIGH (ref 0.1–1.0)
Neutrophils Relative %: 89 % — ABNORMAL HIGH (ref 43–77)

## 2012-04-20 LAB — CREATININE, SERUM
Creatinine, Ser: 2.4 mg/dL — ABNORMAL HIGH (ref 0.50–1.10)
GFR calc non Af Amer: 18 mL/min — ABNORMAL LOW (ref >90)

## 2012-04-20 MED ORDER — ASPIRIN 325 MG PO TABS
325.0000 mg | ORAL_TABLET | Freq: Every day | ORAL | Status: DC
  Administered 2012-04-20: 325 mg via ORAL
  Filled 2012-04-20 (×2): qty 1

## 2012-04-20 NOTE — Progress Notes (Signed)
PATIENT DETAILS Name: Kristie Gonzales Age: 78 y.o. Sex: female Date of Birth: [birth year] Admit Date: 04/19/2012 Admitting Physician Therisa Doyne, MD PCP:No primary provider on file.  Subjective: Admitted with cough and weakness, feels slightly better already  Assessment/Plan: Principal Problem:  *Community acquired pneumonia -Afebrile -significant Leukocytosis on admission of 27K -however non toxic looking -c/w empiric Rocephin and Zithromax -await blood cultures  Active Problems:  Acute kidney failure -likely pre-renal -c/w IVF -recheck lytes in am  HTN -BP currently controlled without need for anti-hypertensive medications -HCTZ on hold  Hypothyroidism -c/w Levothyroxine  Disposition: Remain inpatient  DVT Prophylaxis: Prophylactic Lovenox or Heparin  Code Status: DNI  Procedures:  None  CONSULTS:  None  PHYSICAL EXAM: Vital signs in last 24 hours: Filed Vitals:   04/19/12 2219 04/19/12 2234 04/19/12 2254 04/20/12 0458  BP: 106/58  146/76 117/66  Pulse: 88  100 86  Temp:   98.2 F (36.8 C) 98.8 F (37.1 C)  TempSrc:   Oral Oral  Resp: 18  20 18   Height:   5\' 2"  (1.575 m)   Weight:   72.5 kg (159 lb 13.3 oz)   SpO2: 95% 98% 98% 97%    Weight change:  Body mass index is 29.23 kg/(m^2).   Gen Exam: Awake and alert with clear speech.   Neck: Supple, No JVD.   Chest: B/L Clear.   CVS: S1 S2 Regular, no murmurs.  Abdomen: soft, BS +, non tender, non distended.  Extremities: no edema, lower extremities warm to touch. Neurologic: Non Focal.   Skin: No Rash.   Wounds: N/A.   Intake/Output from previous day:  Intake/Output Summary (Last 24 hours) at 04/20/12 1323 Last data filed at 04/20/12 0458  Gross per 24 hour  Intake   1500 ml  Output   1000 ml  Net    500 ml     LAB RESULTS: CBC  Lab 04/19/12 2317 04/19/12 1830  WBC 27.1* 29.1*  HGB 13.1 14.1  HCT 37.5 40.3  PLT 335 359  MCV 89.1 89.6  MCH 31.1 31.3  MCHC 34.9  35.0  RDW 13.3 13.2  LYMPHSABS 1.4 --  MONOABS 1.4* --  EOSABS 0.3 --  BASOSABS 0.0 --  BANDABS -- --    Chemistries   Lab 04/20/12 0500 04/19/12 2317 04/19/12 1830  NA 138 -- 134*  K 4.0 -- 4.1  CL 98 -- 91*  CO2 25 -- 26  GLUCOSE 108* -- 131*  BUN 55* -- 71*  CREATININE 2.11* 2.40* 2.78*  CALCIUM 8.8 -- 9.8  MG -- -- --    CBG: No results found for this basename: GLUCAP:5 in the last 168 hours  GFR Estimated Creatinine Clearance: 18.8 ml/min (by C-G formula based on Cr of 2.11).  Coagulation profile No results found for this basename: INR:5,PROTIME:5 in the last 168 hours  Cardiac Enzymes No results found for this basename: CK:3,CKMB:3,TROPONINI:3,MYOGLOBIN:3 in the last 168 hours  No components found with this basename: POCBNP:3 No results found for this basename: DDIMER:2 in the last 72 hours No results found for this basename: HGBA1C:2 in the last 72 hours No results found for this basename: CHOL:2,HDL:2,LDLCALC:2,TRIG:2,CHOLHDL:2,LDLDIRECT:2 in the last 72 hours No results found for this basename: TSH,T4TOTAL,FREET3,T3FREE,THYROIDAB in the last 72 hours No results found for this basename: VITAMINB12:2,FOLATE:2,FERRITIN:2,TIBC:2,IRON:2,RETICCTPCT:2 in the last 72 hours No results found for this basename: LIPASE:2,AMYLASE:2 in the last 72 hours  Urine Studies No results found for this basename: UACOL:2,UAPR:2,USPG:2,UPH:2,UTP:2,UGL:2,UKET:2,UBIL:2,UHGB:2,UNIT:2,UROB:2,ULEU:2,UEPI:2,UWBC:2,URBC:2,UBAC:2,CAST:2,CRYS:2,UCOM:2,BILUA:2 in the last 72 hours  MICROBIOLOGY: No results found for this or any previous visit (from the past 240 hour(s)).  RADIOLOGY STUDIES/RESULTS: Dg Chest 2 View  04/19/2012  *RADIOLOGY REPORT*  Clinical Data: Cough and congestion.  CHEST - 2 VIEW  Comparison: 12/11/2007 CT.  10/16/2007 chest x-ray.  Findings: Parenchymal changes lung bases, lingula and middle lobe as well as peripheral aspect right upper lobe.  Some of these findings were  noted previously and may represent scarring. Superimposed basilar pneumonia may be present as noted on preliminary report.  Follow-up to establish the patient's baseline and exclude underlying lesion recommended.  Heart slightly enlarged.  IMPRESSION: Parenchymal changes lung bases, lingula and middle lobe as well as peripheral aspect right upper lobe.  Some of these findings were noted previously and may represent scarring.  Superimposed basilar pneumonia may be present as noted on preliminary report.  Follow-up to establish the patient's baseline and exclude underlying lesion recommended.  Heart slightly enlarged.  Clinically significant discrepancy from primary report, if provided: Follow up as noted above  Original Report Authenticated By: Fuller Canada, M.D.   US Renal Port  04/20/2012  *RADIOLOGY REPORT*  Clinical Data: Acute renal failure  RENAL/URINARY TRACT ULTRASOUND COMPLETE  Comparison:  None.  Findings:  Right Kidney:  10.4 cm length.  Normal cortex and echogenicity.  No hydronephrosis or obstruction.  No focal abnormality.  Left Kidney:  9.3 cm length.  Normal cortex and echogenicity.  No hydronephrosis or obstruction.  No focal abnormality.  Bladder:  Normal appearance by ultrasound  IMPRESSION: No acute finding by ultrasound.  Negative for hydronephrosis  Original Report Authenticated By: Judie Petit. Ruel Favors, M.D.    MEDICATIONS: Scheduled Meds:   . azithromycin  500 mg Oral QHS  . cefTRIAXone (ROCEPHIN)  IV  1 g Intravenous Once  . cefTRIAXone (ROCEPHIN)  IV  1 g Intravenous Q24H  . enoxaparin (LOVENOX) injection  30 mg Subcutaneous Q24H  . levothyroxine  100 mcg Oral Q0600  . sodium chloride  1,000 mL Intravenous Once  . DISCONTD: azithromycin  500 mg Intravenous Once   Continuous Infusions:   . sodium chloride 75 mL (04/20/12 1236)   PRN Meds:.  Antibiotics: Anti-infectives     Start     Dose/Rate Route Frequency Ordered Stop   04/20/12 1800   cefTRIAXone (ROCEPHIN) 1 g in  dextrose 5 % 50 mL IVPB        1 g 100 mL/hr over 30 Minutes Intravenous Every 24 hours 04/19/12 2233 04/27/12 1759   04/19/12 2245   azithromycin (ZITHROMAX) tablet 500 mg        500 mg Oral Daily at bedtime 04/19/12 2233 04/26/12 2159   04/19/12 1830   cefTRIAXone (ROCEPHIN) 1 g in dextrose 5 % 50 mL IVPB        1 g 100 mL/hr over 30 Minutes Intravenous  Once 04/19/12 1817 04/19/12 1944   04/19/12 1830   azithromycin (ZITHROMAX) 500 mg in dextrose 5 % 250 mL IVPB  Status:  Discontinued        500 mg 250 mL/hr over 60 Minutes Intravenous  Once 04/19/12 1817 04/19/12 Daria Pastures, MD  [facility] Pager:[phone]  If 7PM-7AM, please contact night-coverage [email or web address] Password [email or web address] 04/20/2012, 1:23 PM   LOS: 1 day

## 2012-04-20 NOTE — Progress Notes (Signed)
MEDICATION RELATED CONSULT NOTE - INITIAL   Pharmacy Consult: Renally adjust antibiotics  Allergies  Allergen Reactions  . Sulfonamide Derivatives     Patient Measurements: Height: 5\' 2"  (157.5 cm) Weight: 159 lb 13.3 oz (72.5 kg) IBW/kg (Calculated) : 50.1   Vital Signs: Temp: 98.8 F (37.1 C) (07/20 0458) Temp src: Oral (07/20 0458) BP: 117/66 mmHg (07/20 0458) Pulse Rate: 86  (07/20 0458) Intake/Output from previous day: 07/19 0701 - 07/20 0700 In: 1500 [I.V.:1500] Out: 1000 [Urine:1000]  Labs:  Basename 04/20/12 0500 04/20/12 0127 04/19/12 2317 04/19/12 1830  WBC -- -- 27.1* 29.1*  HGB -- -- 13.1 14.1  HCT -- -- 37.5 40.3  PLT -- -- 335 359  APTT -- -- -- --  CREATININE 2.11* -- 2.40* 2.78*  LABCREA -- 100.65 -- --  CREATININE 2.11* -- 2.40* 2.78*  CREAT24HRUR -- -- -- --  MG -- -- -- --  PHOS -- -- -- --  ALBUMIN 2.3* -- -- --  PROT 7.0 -- -- --  ALBUMIN 2.3* -- -- --  AST 19 -- -- --  ALT 12 -- -- --  ALKPHOS 129* -- -- --  BILITOT 0.5 -- -- --  BILIDIR -- -- -- --  IBILI -- -- -- --   Estimated Creatinine Clearance: 18.8 ml/min (by C-G formula based on Cr of 2.11).   Microbiology: No results found for this or any previous visit (from the past 720 hour(s)).  Medical History: Past Medical History  Diagnosis Date  . Thyroid disease   . Hypertension   . Pneumonia 04/19/12  . Hypothyroidism         Assessment: 20 YOF admitted with chief complaints of cough, trouble breathing and cold-like symptoms.  Patient was diagnosed with PNA and started on Z-pack and Levaquin as outpatient.  Inpatient, antibiotics were changed to ceftriaxone and azithromycin.     Plan:  - Continue ceftriaxone 1gm IV Q24H - Continue azithromycin 500mg  IV Q24H - Thank you for the consult!  Pharmacy will sign off as both of these antibiotics do not require adjustment for indication/renal function/hepatic function.     Judeth Gilles D. Laney Potash, PharmD, BCPS Pager:  [phone] 04/20/2012, 11:22 AM

## 2012-04-20 NOTE — ED Provider Notes (Signed)
I have personally seen and examined the patient.  I have discussed the plan of care with the resident.  I have reviewed the documentation on PMH/FH/Soc. History.  I have reviewed the documentation of the resident and agree.  Doug Sou, MD 04/20/12 [phone]

## 2012-04-21 ENCOUNTER — Inpatient Hospital Stay (HOSPITAL_COMMUNITY): Payer: Medicare Other

## 2012-04-21 DIAGNOSIS — Z86010 Personal history of colonic polyps: Secondary | ICD-10-CM

## 2012-04-21 LAB — URINE CULTURE
Colony Count: NO GROWTH
Culture: NO GROWTH

## 2012-04-21 LAB — CBC
HCT: 36.5 % (ref 36.0–46.0)
Hemoglobin: 12.2 g/dL (ref 12.0–15.0)
MCH: 30.1 pg (ref 26.0–34.0)
MCHC: 33.4 g/dL (ref 30.0–36.0)
MCV: 90.1 fL (ref 78.0–100.0)
Platelets: 366 10*3/uL (ref 150–400)
RBC: 4.05 MIL/uL (ref 3.87–5.11)
RDW: 13.5 % (ref 11.5–15.5)
WBC: 20.3 10*3/uL — ABNORMAL HIGH (ref 4.0–10.5)

## 2012-04-21 LAB — LEGIONELLA ANTIGEN, URINE

## 2012-04-21 MED ORDER — SODIUM CHLORIDE 0.9 % IV SOLN
INTRAVENOUS | Status: AC
  Administered 2012-04-21 (×2): 40 mL via INTRAVENOUS

## 2012-04-21 NOTE — Progress Notes (Signed)
PATIENT DETAILS Name: Kristie Gonzales Age: 78 y.o. Sex: female Date of Birth: 1926-01-20 Admit Date: 04/19/2012 Admitting Physician Therisa Doyne, MD PCP:No primary provider on file.  Subjective: Still with cough-breathing better  Assessment/Plan: Principal Problem:  *Community acquired pneumonia -Afebrile -significant Leukocytosis on admission of 27K-now down to 20K -continues to be  non toxic looking -c/w empiric Rocephin and Zithromax -await blood cultures  Active Problems:  Acute kidney failure -likely pre-renal -creatinine significantly better and very close to usual baseline -decrease IVF -recheck lytes in am  ?AMS -daughter on 7/20 expressed concern for speech and cognitive dysfunction that started around 3 weeks back, she wanted to make sure that the patient did not have a CVA, MRI Brain done on 7/21-negative for CVA -AMS-likely toxic metabolic encephalopathy from PNA-now likely back to her usual baseline  HTN -BP currently controlled without need for anti-hypertensive medications -HCTZ on hold  Hypothyroidism -c/w Levothyroxine  Disposition: Remain inpatient  DVT Prophylaxis: Prophylactic Lovenox  Code Status: DNI  Procedures:  None  CONSULTS:  None  PHYSICAL EXAM: Vital signs in last 24 hours: Filed Vitals:   04/20/12 1200 04/20/12 2054 04/21/12 0245 04/21/12 0600  BP: 130/70 121/68 135/69 126/68  Pulse: 89 87 85 89  Temp: 97.8 F (36.6 C) 98.6 F (37 C) 98.2 F (36.8 C) 98.6 F (37 C)  TempSrc: Oral Oral Oral Oral  Resp: 18 18 20 18   Height:      Weight:      SpO2: 96% 95% 97% 95%    Weight change:  Body mass index is 29.23 kg/(m^2).   Gen Exam: Awake and alert with clear speech.   Neck: Supple, No JVD.   Chest: B/L Clear.  Some bibasilar rales today CVS: S1 S2 Regular, no murmurs.  Abdomen: soft, BS +, non tender, non distended.  Extremities: no edema, lower extremities warm to touch. Neurologic: Non Focal.   Skin: No  Rash.   Wounds: N/A.   Intake/Output from previous day:  Intake/Output Summary (Last 24 hours) at 04/21/12 1033 Last data filed at 04/21/12 0500  Gross per 24 hour  Intake 2111.25 ml  Output      0 ml  Net 2111.25 ml     LAB RESULTS: CBC  Lab 04/21/12 0627 04/19/12 2317 04/19/12 1830  WBC 20.3* 27.1* 29.1*  HGB 12.2 13.1 14.1  HCT 36.5 37.5 40.3  PLT 366 335 359  MCV 90.1 89.1 89.6  MCH 30.1 31.1 31.3  MCHC 33.4 34.9 35.0  RDW 13.5 13.3 13.2  LYMPHSABS -- 1.4 --  MONOABS -- 1.4* --  EOSABS -- 0.3 --  BASOSABS -- 0.0 --  BANDABS -- -- --    Chemistries   Lab 04/21/12 0627 04/20/12 0500 04/19/12 2317 04/19/12 1830  NA 139 138 -- 134*  K 3.4* 4.0 -- 4.1  CL 101 98 -- 91*  CO2 25 25 -- 26  GLUCOSE 102* 108* -- 131*  BUN 32* 55* -- 71*  CREATININE 1.53* 2.11* 2.40* 2.78*  CALCIUM 8.5 8.8 -- 9.8  MG -- -- -- --    CBG: No results found for this basename: GLUCAP:5 in the last 168 hours  GFR Estimated Creatinine Clearance: 26 ml/min (by C-G formula based on Cr of 1.53).  Coagulation profile No results found for this basename: INR:5,PROTIME:5 in the last 168 hours  Cardiac Enzymes No results found for this basename: CK:3,CKMB:3,TROPONINI:3,MYOGLOBIN:3 in the last 168 hours  No components found with this basename: POCBNP:3 No results found for  this basename: DDIMER:2 in the last 72 hours No results found for this basename: HGBA1C:2 in the last 72 hours No results found for this basename: CHOL:2,HDL:2,LDLCALC:2,TRIG:2,CHOLHDL:2,LDLDIRECT:2 in the last 72 hours No results found for this basename: TSH,T4TOTAL,FREET3,T3FREE,THYROIDAB in the last 72 hours No results found for this basename: VITAMINB12:2,FOLATE:2,FERRITIN:2,TIBC:2,IRON:2,RETICCTPCT:2 in the last 72 hours No results found for this basename: LIPASE:2,AMYLASE:2 in the last 72 hours  Urine Studies No results found for this basename:  UACOL:2,UAPR:2,USPG:2,UPH:2,UTP:2,UGL:2,UKET:2,UBIL:2,UHGB:2,UNIT:2,UROB:2,ULEU:2,UEPI:2,UWBC:2,URBC:2,UBAC:2,CAST:2,CRYS:2,UCOM:2,BILUA:2 in the last 72 hours  MICROBIOLOGY: No results found for this or any previous visit (from the past 240 hour(s)).  RADIOLOGY STUDIES/RESULTS: Dg Chest 2 View  04/19/2012  *RADIOLOGY REPORT*  Clinical Data: Cough and congestion.  CHEST - 2 VIEW  Comparison: 12/11/2007 CT.  10/16/2007 chest x-ray.  Findings: Parenchymal changes lung bases, lingula and middle lobe as well as peripheral aspect right upper lobe.  Some of these findings were noted previously and may represent scarring. Superimposed basilar pneumonia may be present as noted on preliminary report.  Follow-up to establish the patient's baseline and exclude underlying lesion recommended.  Heart slightly enlarged.  IMPRESSION: Parenchymal changes lung bases, lingula and middle lobe as well as peripheral aspect right upper lobe.  Some of these findings were noted previously and may represent scarring.  Superimposed basilar pneumonia may be present as noted on preliminary report.  Follow-up to establish the patient's baseline and exclude underlying lesion recommended.  Heart slightly enlarged.  Clinically significant discrepancy from primary report, if provided: Follow up as noted above  Original Report Authenticated By: Fuller Canada, M.D.   US Renal Port  04/20/2012  *RADIOLOGY REPORT*  Clinical Data: Acute renal failure  RENAL/URINARY TRACT ULTRASOUND COMPLETE  Comparison:  None.  Findings:  Right Kidney:  10.4 cm length.  Normal cortex and echogenicity.  No hydronephrosis or obstruction.  No focal abnormality.  Left Kidney:  9.3 cm length.  Normal cortex and echogenicity.  No hydronephrosis or obstruction.  No focal abnormality.  Bladder:  Normal appearance by ultrasound  IMPRESSION: No acute finding by ultrasound.  Negative for hydronephrosis  Original Report Authenticated By: Judie Petit. Ruel Favors, M.D.     MEDICATIONS: Scheduled Meds:    . azithromycin  500 mg Oral QHS  . cefTRIAXone (ROCEPHIN)  IV  1 g Intravenous Q24H  . enoxaparin (LOVENOX) injection  30 mg Subcutaneous Q24H  . levothyroxine  100 mcg Oral Q0600  . DISCONTD: aspirin  325 mg Oral Daily   Continuous Infusions:    . sodium chloride 75 mL/hr at 04/21/12 0131   PRN Meds:.  Antibiotics: Anti-infectives     Start     Dose/Rate Route Frequency Ordered Stop   04/20/12 1800   cefTRIAXone (ROCEPHIN) 1 g in dextrose 5 % 50 mL IVPB        1 g 100 mL/hr over 30 Minutes Intravenous Every 24 hours 04/19/12 2233 04/27/12 1759   04/19/12 2245   azithromycin (ZITHROMAX) tablet 500 mg        500 mg Oral Daily at bedtime 04/19/12 2233 04/26/12 2159   04/19/12 1830   cefTRIAXone (ROCEPHIN) 1 g in dextrose 5 % 50 mL IVPB        1 g 100 mL/hr over 30 Minutes Intravenous  Once 04/19/12 1817 04/19/12 1944   04/19/12 1830   azithromycin (ZITHROMAX) 500 mg in dextrose 5 % 250 mL IVPB  Status:  Discontinued        500 mg 250 mL/hr over 60 Minutes Intravenous  Once 04/19/12  1610 04/19/12 Daria Pastures, MD  [facility] Pager:[phone]  If 7PM-7AM, please contact night-coverage [email or web address] Password [facility] 04/21/2012, 10:33 AM   LOS: 2 days

## 2012-04-21 NOTE — Evaluation (Signed)
Physical Therapy Evaluation Patient Details Name: TEMPLE EWART MRN: [id number] DOB: 10/27/1925 Today's Date: 04/21/2012 Time: 4098-1191 PT Time Calculation (min): 19 min  PT Assessment / Plan / Recommendation Clinical Impression  Patient is an 78 yo female admitted with CAP.  Patient was very independent pta, taking care of son, driving, etc.  Today, patient with decreased cognition, mobility, and balance.  If cognition does not clear, feel patient will need 24 hour assist for safety/mobility.  Recommend ST-SNF for continued therapy at discharge to address mobility and cognition.  Recommend OT consult while in hospital to address ADL's.  Will follow acutely for mobility/gait training, balance, and education.    PT Assessment  Patient needs continued PT services    Follow Up Recommendations  Skilled nursing facility (If 24 hour assist not available at discharge.)    Barriers to Discharge Decreased caregiver support      Equipment Recommendations  Defer to next venue    Recommendations for Other Services OT consult   Frequency Min 3X/week    Precautions / Restrictions Precautions Precautions: Fall Restrictions Weight Bearing Restrictions: No         Mobility  Transfers Transfers: Sit to Stand;Stand to Sit Sit to Stand: 4: Min assist;With upper extremity assist;From chair/3-in-1 Stand to Sit: 4: Min assist;With upper extremity assist;With armrests;To chair/3-in-1 Details for Transfer Assistance: Verbal and tactile cues for safe hand placement.  Assist due to slightly decreased balance Ambulation/Gait Ambulation/Gait Assistance: 4: Min assist Ambulation Distance (Feet): 78 Feet Assistive device: 1 person hand held assist Ambulation/Gait Assistance Details: Patient with slightly staggering gait, with decreased balance.  Reaching for rail in hallway - cues to refrain from reaching for objects for support. Gait Pattern: Step-through pattern;Decreased stride  length;Shuffle;Trunk flexed    Exercises     PT Diagnosis: Difficulty walking;Abnormality of gait;Generalized weakness  PT Problem List: Decreased strength;Decreased activity tolerance;Decreased balance;Decreased mobility;Decreased cognition;Decreased knowledge of use of DME;Decreased safety awareness;Cardiopulmonary status limiting activity PT Treatment Interventions: DME instruction;Gait training;Functional mobility training;Therapeutic activities;Balance training;Cognitive remediation;Patient/family education   PT Goals Acute Rehab PT Goals PT Goal Formulation: With patient Time For Goal Achievement: 04/28/12 Potential to Achieve Goals: Good Pt will go Supine/Side to Sit: with supervision;with HOB 0 degrees PT Goal: Supine/Side to Sit - Progress: Goal set today Pt will go Sit to Supine/Side: with supervision;with HOB 0 degrees PT Goal: Sit to Supine/Side - Progress: Goal set today Pt will go Sit to Stand: with supervision PT Goal: Sit to Stand - Progress: Goal set today Pt will go Stand to Sit: with supervision PT Goal: Stand to Sit - Progress: Goal set today Pt will Ambulate: >150 feet;with supervision;with least restrictive assistive device PT Goal: Ambulate - Progress: Goal set today  Visit Information  Last PT Received On: 04/21/12 Assistance Needed: +1    Subjective Data  Subjective: "My daughter says you can do anything if you try." Patient Stated Goal: To go home   Prior Functioning  Home Living Lives With: Alone Available Help at Discharge: Family;Available PRN/intermittently Type of Home: Apartment Home Access: Stairs to enter Entrance Stairs-Number of Steps: 2 Entrance Stairs-Rails: Left;Right Home Layout: One level Home Adaptive Equipment: None Prior Function Level of Independence: Independent (Takes care of son who lives nearby) Able to Take Stairs?: Yes Driving: Yes Vocation: Retired Musician: No difficulties    Cognition  Overall  Cognitive Status: Impaired Area of Impairment: Memory;Safety/judgement;Awareness of deficits Arousal/Alertness: Awake/alert Orientation Level: Disoriented to;Place;Time;Situation Behavior During Session: [facility] for tasks performed Memory Deficits:  Unable to recall details of living situation Safety/Judgement: Decreased safety judgement for tasks assessed;Decreased awareness of need for assistance Awareness of Deficits: Unable to state why she is in hospital Cognition - Other Comments: Per daughter, this is a big change in cognition for patient.  PTA patient was oriented, driving, and taking care of her son.    Extremity/Trunk Assessment Right Upper Extremity Assessment RUE ROM/Strength/Tone: [facility] for tasks assessed Left Upper Extremity Assessment LUE ROM/Strength/Tone: WFL for tasks assessed Right Lower Extremity Assessment RLE ROM/Strength/Tone: [facility] for tasks assessed Left Lower Extremity Assessment LLE ROM/Strength/Tone: WFL for tasks assessed   Balance    End of Session PT - End of Session Equipment Utilized During Treatment: Gait belt Activity Tolerance: Patient limited by fatigue (Limited due to cognition) Patient left: in chair;with call bell/phone within reach;with family/visitor present Nurse Communication: Mobility status  GP     Vena Austria 04/21/2012, 10:58 AM Durenda Hurt. Renaldo Fiddler, [facility] Acute Rehab Services Pager [phone]

## 2012-04-22 ENCOUNTER — Inpatient Hospital Stay (HOSPITAL_COMMUNITY): Payer: Medicare Other

## 2012-04-22 LAB — CBC
HCT: 36.3 % (ref 36.0–46.0)
Hemoglobin: 12.2 g/dL (ref 12.0–15.0)
MCH: 30.3 pg (ref 26.0–34.0)
MCV: 90.1 fL (ref 78.0–100.0)
Platelets: 426 10*3/uL — ABNORMAL HIGH (ref 150–400)
RBC: 4.03 MIL/uL (ref 3.87–5.11)
WBC: 18.5 10*3/uL — ABNORMAL HIGH (ref 4.0–10.5)

## 2012-04-22 LAB — BASIC METABOLIC PANEL
CO2: 25 mEq/L (ref 19–32)
CO2: 27 mEq/L (ref 19–32)
Calcium: 8.8 mg/dL (ref 8.4–10.5)
Chloride: 101 mEq/L (ref 96–112)
Chloride: 98 mEq/L (ref 96–112)
Creatinine, Ser: 1.35 mg/dL — ABNORMAL HIGH (ref 0.50–1.10)
Glucose, Bld: 102 mg/dL — ABNORMAL HIGH (ref 70–99)
Glucose, Bld: 103 mg/dL — ABNORMAL HIGH (ref 70–99)
Potassium: 3.4 mEq/L — ABNORMAL LOW (ref 3.5–5.1)
Sodium: 139 mEq/L (ref 135–145)

## 2012-04-22 MED ORDER — POTASSIUM CHLORIDE CRYS ER 20 MEQ PO TBCR
40.0000 meq | EXTENDED_RELEASE_TABLET | Freq: Once | ORAL | Status: AC
  Administered 2012-04-22: 40 meq via ORAL
  Filled 2012-04-22: qty 2

## 2012-04-22 NOTE — Progress Notes (Signed)
Clinical Social Worker continuing to follow for discharge planning. Clinical Social Worker initiated [employer] authorization for SNF with [employer]. Clinical Child psychotherapist met with pt and pt daughter to discuss bed offers and in-network vs out-of-network insurance benefits for SNF. Pt and pt family choose bed at [facility]. Clinical Social Worker notified facility. Clinical Social Worker contacted [employer] to confirm clinicals had been received and insurance is reviewing for authorization. Clinical Public affairs consultant for SNF. Clinical Social Worker to continue to follow and facilitate pt discharge needs when pt medically stable for discharge and insurance authorization received.   Jacklynn Lewis, MSW, LCSWA  Clinical Social Work [phone]

## 2012-04-22 NOTE — Plan of Care (Signed)
Problem: Phase I Progression Outcomes Goal: Initial discharge plan identified Outcome: Completed/Met Date Met:  04/22/12 SNF placement  Problem: Phase II Progression Outcomes Goal: Discharge plan established Outcome: Completed/Met Date Met:  04/22/12 SNF tomorrow, Kristie Gonzales

## 2012-04-22 NOTE — Clinical Social Work Psychosocial (Signed)
     Clinical Social Work Department BRIEF PSYCHOSOCIAL ASSESSMENT 04/22/2012  Patient:  Kristie Gonzales, Kristie Gonzales     Account Number:  [id number]     Admit date:  04/19/2012  Clinical Social Worker:  Jacelyn Grip  Date/Time:  04/22/2012 09:40 AM  Referred by:  Physician  Date Referred:  04/22/2012 Referred for  SNF Placement   Other Referral:   Interview type:  Patient Other interview type:   patient daughter at bedside    PSYCHOSOCIAL DATA Living Status:  ALONE Admitted from facility:   Level of care:   Primary support name:  [name]/daughter/[phone] Primary support relationship to patient:  CHILD, ADULT Degree of support available:   strong    CURRENT CONCERNS Current Concerns  Post-Acute Placement   Other Concerns:    SOCIAL WORK ASSESSMENT / PLAN CSW received notification from MD that pt will need short term rehab at [facility]. CSW met with pt and pt daughter at bedside to discuss. CSW explained role and discussed recommendation for short term rehab at [facility]. CSW discussed process of initiation of SNF search to facilities in [facility]. Pt agreeable to SNF search and interested in [facility] in [place]. CSW completed FL2, submitted Pasarr and initiated SNF search to [facility]. CSW to follow up with pt and pt daughter in regard to bed offers. CSW to facilitate pt discharge needs when pt medically stable for discharge.   Assessment/plan status:  Psychosocial Support/Ongoing Assessment of Needs Other assessment/ plan:   discharge planning   Information/referral to community resources:   [facility] list    PATIENTS/FAMILYS RESPONSE TO PLAN OF CARE: Pt alert and oriented. Pt initially hesitant to rehab at SNF, but agreeable at this time. Pt daughter interested in pt being near pt daughter's home.

## 2012-04-22 NOTE — Evaluation (Signed)
Speech Language Pathology Evaluation Patient Details Name: AVRY MONTELEONE MRN: [id number] DOB: 18-Nov-1925 Today's Date: 04/22/2012 Time: 4098-1191 SLP Time Calculation (min): 16 min  Problem List:  Patient Active Problem List  Diagnosis  . Essential hypertension, benign  . PERSONAL HISTORY OF COLONIC POLYPS  . Community acquired pneumonia  . Acute kidney failure   Past Medical History:  Past Medical History  Diagnosis Date  . Thyroid disease   . Hypertension   . Pneumonia 04/19/12  . Hypothyroidism    Past Surgical History:  Past Surgical History  Procedure Date  . Abdominal hysterectomy   . Appendectomy    HPI:  78 y.o. female admitted from home with community acquired pneumonia, confusion.  Her daughter reports cognitive changes the last three weeks with short-term memory loss.  MRI negative for acute changes.     Assessment / Plan / Recommendation Clinical Impression  Pt presents with gradual cognitive changes the last several weeks per daughter's report.  She demonstrates decreased ability to store new verbal/visual information; impaired awareness, judgement, leading to concerns for safety.   Recommend SLP intervention at SNF to address new cognitive deficits.      SLP Assessment  All further Speech Language Pathology  needs can be addressed in the next venue of care    Follow Up Recommendations  Skilled Nursing facility      Bascom L. Samson Frederic, [place] CCC/SLP Pager [phone]   Blenda Mounts Laurice 04/22/2012, 12:17 PM

## 2012-04-22 NOTE — Progress Notes (Signed)
PATIENT DETAILS Name: Kristie Gonzales Age: 78 y.o. Sex: female Date of Birth: 1926-06-26 Admit Date: 04/19/2012 Admitting Physician Therisa Doyne, MD PCP:No primary provider on file.  Subjective: Feels a lot better  Assessment/Plan: Principal Problem:  *Community acquired pneumonia -Afebrile -significant Leukocytosis on admission of 27K-now down to 18.5K -continues to be  non toxic looking -c/w empiric Rocephin and Zithromax-transition to oral Avelox on discharge -await blood cultures  Active Problems:  Acute kidney failure -likely pre-renal -creatinine significantly better and very close to usual baseline -KVO IVF -monitor lytes periodically  ?AMS -daughter on 7/20 expressed concern for speech and cognitive dysfunction that started around 3 weeks back, she wanted to make sure that the patient did not have a CVA, MRI Brain done on 7/21-negative for CVA -AMS-likely toxic metabolic encephalopathy from PNA-now likely back to her usual baseline  HTN -BP currently controlled without need for anti-hypertensive medications -HCTZ on hold  Hypothyroidism -c/w Levothyroxine  Disposition: Remain inpatient-SNF 1-2 days  DVT Prophylaxis: Prophylactic Lovenox  Code Status: DNI-confirmed with daughter and patient  Procedures:  None  CONSULTS:  None  PHYSICAL EXAM: Vital signs in last 24 hours: Filed Vitals:   04/21/12 2100 04/22/12 0200 04/22/12 0457 04/22/12 0947  BP: 117/68 122/64 146/71 117/72  Pulse: 101 83 87 94  Temp: 99.2 F (37.3 C) 98.2 F (36.8 C) 98.3 F (36.8 C) 98.5 F (36.9 C)  TempSrc: Oral Oral Oral Oral  Resp: 18 20 18 18   Height:      Weight:      SpO2: 92% 95% 93% 93%    Weight change:  Body mass index is 29.23 kg/(m^2).   Gen Exam: Awake and alert with clear speech.   Neck: Supple, No JVD.   Chest: B/L Clear.  Some bibasilar rales today CVS: S1 S2 Regular, no murmurs.  Abdomen: soft, BS +, non tender, non distended.    Extremities: no edema, lower extremities warm to touch. Neurologic: Non Focal.   Skin: No Rash.   Wounds: N/A.   Intake/Output from previous day:  Intake/Output Summary (Last 24 hours) at 04/22/12 1240 Last data filed at 04/22/12 0901  Gross per 24 hour  Intake    600 ml  Output      0 ml  Net    600 ml     LAB RESULTS: CBC  Lab 04/22/12 0600 04/21/12 0627 04/19/12 2317 04/19/12 1830  WBC 18.5* 20.3* 27.1* 29.1*  HGB 12.2 12.2 13.1 14.1  HCT 36.3 36.5 37.5 40.3  PLT 426* 366 335 359  MCV 90.1 90.1 89.1 89.6  MCH 30.3 30.1 31.1 31.3  MCHC 33.6 33.4 34.9 35.0  RDW 13.7 13.5 13.3 13.2  LYMPHSABS -- -- 1.4 --  MONOABS -- -- 1.4* --  EOSABS -- -- 0.3 --  BASOSABS -- -- 0.0 --  BANDABS -- -- -- --    Chemistries   Lab 04/22/12 0600 04/21/12 0627 04/20/12 0500 04/19/12 2317 04/19/12 1830  NA 138 139 138 -- 134*  K 3.1* 3.4* 4.0 -- 4.1  CL 98 101 98 -- 91*  CO2 27 25 25  -- 26  GLUCOSE 103* 102* 108* -- 131*  BUN 23 32* 55* -- 71*  CREATININE 1.35* 1.53* 2.11* 2.40* 2.78*  CALCIUM 8.8 8.5 8.8 -- 9.8  MG -- -- -- -- --    CBG: No results found for this basename: GLUCAP:5 in the last 168 hours  GFR Estimated Creatinine Clearance: 29.5 ml/min (by C-G formula based on Cr of  1.35).  Coagulation profile No results found for this basename: INR:5,PROTIME:5 in the last 168 hours  Cardiac Enzymes No results found for this basename: CK:3,CKMB:3,TROPONINI:3,MYOGLOBIN:3 in the last 168 hours  No components found with this basename: POCBNP:3 No results found for this basename: DDIMER:2 in the last 72 hours No results found for this basename: HGBA1C:2 in the last 72 hours No results found for this basename: CHOL:2,HDL:2,LDLCALC:2,TRIG:2,CHOLHDL:2,LDLDIRECT:2 in the last 72 hours No results found for this basename: TSH,T4TOTAL,FREET3,T3FREE,THYROIDAB in the last 72 hours No results found for this basename: VITAMINB12:2,FOLATE:2,FERRITIN:2,TIBC:2,IRON:2,RETICCTPCT:2 in the  last 72 hours No results found for this basename: LIPASE:2,AMYLASE:2 in the last 72 hours  Urine Studies No results found for this basename: UACOL:2,UAPR:2,USPG:2,UPH:2,UTP:2,UGL:2,UKET:2,UBIL:2,UHGB:2,UNIT:2,UROB:2,ULEU:2,UEPI:2,UWBC:2,URBC:2,UBAC:2,CAST:2,CRYS:2,UCOM:2,BILUA:2 in the last 72 hours  MICROBIOLOGY: Recent Results (from the past 240 hour(s))  CULTURE, BLOOD (ROUTINE X 2)     Status: Normal (Preliminary result)   Collection Time   04/19/12  6:20 PM      Component Value Range Status Comment   Specimen Description BLOOD ARM RIGHT   Final    Special Requests BOTTLES DRAWN AEROBIC ONLY 10CC   Final    Culture  Setup Time 04/20/2012 01:52   Final    Culture     Final    Value:        BLOOD CULTURE RECEIVED NO GROWTH TO DATE CULTURE WILL BE HELD FOR 5 DAYS BEFORE ISSUING A FINAL NEGATIVE REPORT   Report Status PENDING   Incomplete   CULTURE, BLOOD (ROUTINE X 2)     Status: Normal (Preliminary result)   Collection Time   04/19/12  6:30 PM      Component Value Range Status Comment   Specimen Description BLOOD HAND LEFT   Final    Special Requests BOTTLES DRAWN AEROBIC ONLY 2CC   Final    Culture  Setup Time 04/20/2012 01:51   Final    Culture     Final    Value:        BLOOD CULTURE RECEIVED NO GROWTH TO DATE CULTURE WILL BE HELD FOR 5 DAYS BEFORE ISSUING A FINAL NEGATIVE REPORT   Report Status PENDING   Incomplete   URINE CULTURE     Status: Normal   Collection Time   04/20/12  1:27 AM      Component Value Range Status Comment   Specimen Description URINE, RANDOM   Final    Special Requests NONE   Final    Culture  Setup Time 04/20/2012 11:34   Final    Colony Count NO GROWTH   Final    Culture NO GROWTH   Final    Report Status 04/21/2012 FINAL   Final     RADIOLOGY STUDIES/RESULTS: Dg Chest 2 View  04/19/2012  *RADIOLOGY REPORT*  Clinical Data: Cough and congestion.  CHEST - 2 VIEW  Comparison: 12/11/2007 CT.  10/16/2007 chest x-ray.  Findings: Parenchymal changes lung  bases, lingula and middle lobe as well as peripheral aspect right upper lobe.  Some of these findings were noted previously and may represent scarring. Superimposed basilar pneumonia may be present as noted on preliminary report.  Follow-up to establish the patient's baseline and exclude underlying lesion recommended.  Heart slightly enlarged.  IMPRESSION: Parenchymal changes lung bases, lingula and middle lobe as well as peripheral aspect right upper lobe.  Some of these findings were noted previously and may represent scarring.  Superimposed basilar pneumonia may be present as noted on preliminary report.  Follow-up to establish the patient's  baseline and exclude underlying lesion recommended.  Heart slightly enlarged.  Clinically significant discrepancy from primary report, if provided: Follow up as noted above  Original Report Authenticated By: Fuller Canada, M.D.   US Renal Port  04/20/2012  *RADIOLOGY REPORT*  Clinical Data: Acute renal failure  RENAL/URINARY TRACT ULTRASOUND COMPLETE  Comparison:  None.  Findings:  Right Kidney:  10.4 cm length.  Normal cortex and echogenicity.  No hydronephrosis or obstruction.  No focal abnormality.  Left Kidney:  9.3 cm length.  Normal cortex and echogenicity.  No hydronephrosis or obstruction.  No focal abnormality.  Bladder:  Normal appearance by ultrasound  IMPRESSION: No acute finding by ultrasound.  Negative for hydronephrosis  Original Report Authenticated By: Judie Petit. Ruel Favors, M.D.    MEDICATIONS: Scheduled Meds:    . azithromycin  500 mg Oral QHS  . cefTRIAXone (ROCEPHIN)  IV  1 g Intravenous Q24H  . enoxaparin (LOVENOX) injection  30 mg Subcutaneous Q24H  . levothyroxine  100 mcg Oral Q0600   Continuous Infusions:    . sodium chloride 40 mL (04/21/12 1056)   PRN Meds:.  Antibiotics: Anti-infectives     Start     Dose/Rate Route Frequency Ordered Stop   04/20/12 1800   cefTRIAXone (ROCEPHIN) 1 g in dextrose 5 % 50 mL IVPB        1 g 100  mL/hr over 30 Minutes Intravenous Every 24 hours 04/19/12 2233 04/27/12 1759   04/19/12 2245   azithromycin (ZITHROMAX) tablet 500 mg        500 mg Oral Daily at bedtime 04/19/12 2233 04/26/12 2159   04/19/12 1830   cefTRIAXone (ROCEPHIN) 1 g in dextrose 5 % 50 mL IVPB        1 g 100 mL/hr over 30 Minutes Intravenous  Once 04/19/12 1817 04/19/12 1944   04/19/12 1830   azithromycin (ZITHROMAX) 500 mg in dextrose 5 % 250 mL IVPB  Status:  Discontinued        500 mg 250 mL/hr over 60 Minutes Intravenous  Once 04/19/12 1817 04/19/12 Daria Pastures, MD  [facility] Pager:[phone]  If 7PM-7AM, please contact night-coverage [email or web address] Password [email or web address] 04/22/2012, 12:40 PM   LOS: 3 days

## 2012-04-22 NOTE — Care Management Note (Signed)
    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     Action/Plan:   pt eval- recs snf   Anticipated DC Date:  04/25/2012   Anticipated DC Plan:  SKILLED NURSING FACILITY  In-house referral  Clinical Social Worker      DC Planning Services  CM consult      Choice offered to / List presented to:             Status of service:  Completed, signed off Medicare Important Message given?   (If response is "NO", the following Medicare IM given date fields will be blank) Date Medicare IM given:   Date Additional Medicare IM given:    Discharge Disposition:  SKILLED NURSING FACILITY  Per UR Regulation:  Reviewed for med. necessity/level of care/duration of stay  If discussed at Long Length of Stay Meetings, dates discussed:    Comments:  04/23/12 15:40 Letha Cape RN, BSN [phone] patient dc to snf, [facility].  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  Patient is confused at this times.  Daughter will need to take care of disabled son.  Physical therapy recs snf.   Plan for patient is snf, CSW spoke with patient and daughter.

## 2012-04-22 NOTE — Progress Notes (Addendum)
Clinical Social Work Department CLINICAL SOCIAL WORK PLACEMENT NOTE 04/22/2012  Patient:  Kristie Gonzales, Kristie Gonzales  Account Number:  [id number] Admit date:  04/19/2012  Clinical Social Worker:  Jacelyn Grip  Date/time:  04/22/2012 10:00 AM  Clinical Social Work is seeking post-discharge placement for this patient at the following level of care:   SKILLED NURSING   (*CSW will update this form in Epic as items are completed)   04/22/2012  Patient/family provided with [facility] Department of Clinical Social Work's list of facilities offering this level of care within the geographic area requested by the patient (or if unable, by the patient's family).  04/22/2012  Patient/family informed of their freedom to choose among providers that offer the needed level of care, that participate in Medicare, Medicaid or managed care program needed by the patient, have an available bed and are willing to accept the patient.  04/22/2012  Patient/family informed of [facility]' ownership interest in [facility], as well as of the fact that they are under no obligation to receive care at this facility.  PASARR submitted to [facility] on 04/22/2012 PASARR number received from EDS on 04/22/2012  FL2 transmitted to all facilities in geographic area requested by pt/family on  04/22/2012 FL2 transmitted to all facilities within larger geographic area on   Patient informed that his/her managed care company has contracts with or will negotiate with  certain facilities, including the following:     Patient/family informed of bed offers received: 04/22/2012  Patient chooses bed at [facility] Physician recommends and patient chooses bed at    Patient to be transferred to  on  [facility] on 04/23/2012 Patient to be transferred to facility by ambulance  The following physician request were entered in Epic:   Additional Comments:   Jacklynn Lewis, MSW, [facility]  Clinical  Social Work [phone]

## 2012-04-23 LAB — EXPECTORATED SPUTUM ASSESSMENT W GRAM STAIN, RFLX TO RESP C: Special Requests: NORMAL

## 2012-04-23 LAB — CBC
HCT: 34.1 % — ABNORMAL LOW (ref 36.0–46.0)
MCH: 30.4 pg (ref 26.0–34.0)
MCV: 90.9 fL (ref 78.0–100.0)
RDW: 13.8 % (ref 11.5–15.5)
WBC: 16.6 10*3/uL — ABNORMAL HIGH (ref 4.0–10.5)

## 2012-04-23 MED ORDER — ACETAMINOPHEN 500 MG PO TABS
500.0000 mg | ORAL_TABLET | Freq: Four times a day (QID) | ORAL | Status: DC | PRN
  Filled 2012-04-23: qty 1

## 2012-04-23 MED ORDER — MOXIFLOXACIN HCL 400 MG PO TABS: 400.0000 mg | ORAL_TABLET | Freq: Every day | ORAL | Status: AC

## 2012-04-23 NOTE — Progress Notes (Signed)
Physical Therapy Treatment Patient Details Name: Kristie Gonzales MRN: [id number] DOB: 1925/12/29 Today's Date: 04/23/2012 Time: 1027-2536 PT Time Calculation (min): 18 min  PT Assessment / Plan / Recommendation Comments on Treatment Session  Pt admitted with PNA and AMS and progressing with mobility today. Pt sats 94% on RA at rest, drop to 89-91% on RA with ambulation, back to 92% at rest on 1L. Will continue to follow to maximize mobility    Follow Up Recommendations       Barriers to Discharge        Equipment Recommendations       Recommendations for Other Services    Frequency     Plan Discharge plan remains appropriate;Frequency remains appropriate    Precautions / Restrictions Precautions Precautions: Fall   Pertinent Vitals/Pain No pain    Mobility  Bed Mobility Bed Mobility: Not assessed Transfers Transfers: Sit to Stand;Stand to Sit Sit to Stand: 5: Supervision;From chair/3-in-1;With armrests Stand to Sit: 5: Supervision;To chair/3-in-1;With armrests Details for Transfer Assistance: cueing for safety Ambulation/Gait Ambulation/Gait Assistance: 4: Min guard Ambulation Distance (Feet): 180 Feet Assistive device: None Ambulation/Gait Assistance Details: Pt with short step length grossly half her foot length and unable to increase step length with cueing. Pt with slow gait but improved distance. Pt with support of 2 fingers holding pulse ox while ambulating Gait Pattern: Decreased step length - right;Decreased step length - left;Trunk flexed;Narrow base of support Gait velocity: decreased    Exercises General Exercises - Lower Extremity Long Arc Quad: AROM;Both;20 reps;Seated Hip Flexion/Marching: AROM;Both;20 reps;Seated   PT Diagnosis:    PT Problem List:   PT Treatment Interventions:     PT Goals Acute Rehab PT Goals Pt will go Sit to Stand: with modified independence PT Goal: Sit to Stand - Progress: Updated due to goal met Pt will go Stand to Sit:  with modified independence PT Goal: Stand to Sit - Progress: Updated due to goals met PT Goal: Ambulate - Progress: Progressing toward goal  Visit Information  Last PT Received On: 04/23/12 Assistance Needed: +1    Subjective Data  Subjective: I'm doing ok   Cognition  Overall Cognitive Status: Impaired Area of Impairment: Memory;Safety/judgement Arousal/Alertness: Awake/alert Orientation Level: Appears intact for tasks assessed Behavior During Session: [facility] for tasks performed Safety/Judgement: Decreased awareness of need for assistance    Balance     End of Session PT - End of Session Equipment Utilized During Treatment: Gait belt Activity Tolerance: Patient tolerated treatment well Patient left: in chair;with call bell/phone within reach;with family/visitor present Nurse Communication: Mobility status   GP     Delorse Lek 04/23/2012, 9:00 AM Delaney Meigs, PT [phone]

## 2012-04-23 NOTE — Progress Notes (Addendum)
Clinical Social Worker continuing to follow for discharge planning. Clinical Social Worker contacted pt insurance, [employer] this morning to follow up in regard to authorization for SNF and left message. Clinical Social Worker awaiting response from pt insurance in regard to authorization for SNF. Clinical Social Worker to continue to follow.  Addendum 11:30am: Clinical Child psychotherapist received notification from pt insurance that pt authorized for one week stay at [facility]. Clinical Social Worker discussed with pt and pt daughter at bedside. Clinical Social Worker notified MD. Clinical Social Worker left message with Youth worker. Clinical Social Worker to facilitate pt discharge needs this afternoon.  Jacklynn Lewis, MSW, LCSWA  Clinical Social Work [phone]

## 2012-04-23 NOTE — Progress Notes (Signed)
Clinical Social Worker facilitated pt discharge needs including contacting facility, faxing pt discharge information via [facility], discussing with pt and pt daughter at bedside, providing contact number to facility to RN to call report, and arranging ambulance transportation for pt to [facility]. No further social work needs identified at this time. Clinical Social Worker signing off.  Jacklynn Lewis, MSW, LCSWA  Clinical Social Work [phone]

## 2012-04-23 NOTE — Discharge Summary (Signed)
PATIENT DETAILS Name: Kristie Gonzales Age: 78 y.o. Sex: female Date of Birth: 11/04/1925 MRN: [id number]. Admit Date: 04/19/2012 Admitting Physician: Therisa Doyne, MD WUJ:[id number], Lorenda Ishihara, MD  Recommendations for Outpatient Follow-up:  1. Needs a follow up Chest X Ray in 6-8 weeks to document resolution of the PNA  PRIMARY DISCHARGE DIAGNOSIS:  Principal Problem:  *Community acquired pneumonia Active Problems:  Essential hypertension, benign  Acute kidney failure      PAST MEDICAL HISTORY: Past Medical History  Diagnosis Date  . Thyroid disease   . Hypertension   . Pneumonia 04/19/12  . Hypothyroidism     DISCHARGE MEDICATIONS: Medication List  As of 04/23/2012 11:50 AM   STOP taking these medications         azithromycin 250 MG tablet      hydrochlorothiazide 25 MG tablet      levofloxacin 500 MG tablet         TAKE these medications         levothyroxine 100 MCG tablet   Commonly known as: SYNTHROID, LEVOTHROID   Take 100 mcg by mouth daily.      loperamide 2 MG capsule   Commonly known as: IMODIUM   Take 2 mg by mouth 4 (four) times daily as needed. diarrhea      moxifloxacin 400 MG tablet   Commonly known as: AVELOX   Take 1 tablet (400 mg total) by mouth daily. 5 more days from 04/23/12             BRIEF HPI:  See H&P, Labs, Consult and Test reports for all details in brief,Presented with 1 week history of cough, trouble breathing, cold like symptoms hoarseness, this Am she has been confused. Daughter took her to PCP who diagnosed her with PNA and started on Z-pack and Levaquin this am she took the first dose and then was brought to ER. Family reports subjective fever   CONSULTATIONS:   None  PERTINENT RADIOLOGIC STUDIES: Dg Chest 2 View  04/22/2012  *RADIOLOGY REPORT*  Clinical Data: Follow up pneumonia  CHEST - 2 VIEW  Comparison: 04/19/2012  Findings: Cardiomediastinal silhouette is stable.  Persistent patchy atelectasis , scarring or  chronic infiltrate in the right middle lobe. Patchy scarring in the right upper lobe peripherally stable.  Worsening patchy airspace disease in the lingula and left base suspicious for pneumonia.  Probable small left pleural effusion.  No pulmonary edema.  IMPRESSION: Persistent patchy atelectasis , scarring or chronic infiltrate in the right middle lobe. Patchy scarring in the right upper lobe peripherally stable.  Worsening patchy airspace disease in the lingula and left base suspicious for pneumonia.  Probable small left pleural effusion.  No pulmonary edema.  Original Report Authenticated By: Natasha Mead, M.D.   Dg Chest 2 View  04/19/2012  *RADIOLOGY REPORT*  Clinical Data: Cough and congestion.  CHEST - 2 VIEW  Comparison: 12/11/2007 CT.  10/16/2007 chest x-ray.  Findings: Parenchymal changes lung bases, lingula and middle lobe as well as peripheral aspect right upper lobe.  Some of these findings were noted previously and may represent scarring. Superimposed basilar pneumonia may be present as noted on preliminary report.  Follow-up to establish the patient's baseline and exclude underlying lesion recommended.  Heart slightly enlarged.  IMPRESSION: Parenchymal changes lung bases, lingula and middle lobe as well as peripheral aspect right upper lobe.  Some of these findings were noted previously and may represent scarring.  Superimposed basilar pneumonia may be present as noted on preliminary  report.  Follow-up to establish the patient's baseline and exclude underlying lesion recommended.  Heart slightly enlarged.  Clinically significant discrepancy from primary report, if provided: Follow up as noted above  Original Report Authenticated By: Fuller Canada, M.D.   Mr Brain Wo Contrast  04/21/2012  *RADIOLOGY REPORT*  Clinical Data: 78 year old female with confusion.  Altered mental status and slurred speech.  MRI HEAD WITHOUT CONTRAST  Technique:  Multiplanar, multiecho pulse sequences of the brain and  surrounding structures were obtained according to standard protocol without intravenous contrast.  Comparison: None.  Findings: Intermittent mild motion artifact. No restricted diffusion to suggest acute infarction.  No midline shift, mass effect, evidence of mass lesion, ventriculomegaly, extra-axial collection or acute intracranial hemorrhage.  Cervicomedullary junction and pituitary are within normal limits.  Major intracranial vascular flow voids are preserved.  Cerebral volume is age appropriate.  There is mild for age scattered and patchy cerebral white matter T2 and FLAIR hyperintensity.  Comparatively mild T2 hyperintensity in the deep gray matter nuclei.  Brainstem and cerebellum are within normal limits for age.  Cervical spine within normal limits for age. Normal marrow signal.  Postoperative changes to the globes.  Mild paranasal sinus mucosal thickening.  Clear mastoids.  Negative scalp soft tissues.  IMPRESSION: 1. No acute intracranial abnormality. 2.  Mild for age nonspecific white and deep gray matter signal changes, most commonly due to small vessel disease. 3.  Mild paranasal sinus inflammatory changes.  Original Report Authenticated By: Harley Hallmark, M.D.   US Renal Port  04/20/2012  *RADIOLOGY REPORT*  Clinical Data: Acute renal failure  RENAL/URINARY TRACT ULTRASOUND COMPLETE  Comparison:  None.  Findings:  Right Kidney:  10.4 cm length.  Normal cortex and echogenicity.  No hydronephrosis or obstruction.  No focal abnormality.  Left Kidney:  9.3 cm length.  Normal cortex and echogenicity.  No hydronephrosis or obstruction.  No focal abnormality.  Bladder:  Normal appearance by ultrasound  IMPRESSION: No acute finding by ultrasound.  Negative for hydronephrosis  Original Report Authenticated By: Judie Petit. Ruel Favors, M.D.     PERTINENT LAB RESULTS: CBC:  Basename 04/23/12 0538 04/22/12 0600  WBC 16.6* 18.5*  HGB 11.4* 12.2  HCT 34.1* 36.3  PLT 449* 426*   CMET CMP     Component  Value Date/Time   NA 138 04/22/2012 0600   K 3.1* 04/22/2012 0600   CL 98 04/22/2012 0600   CO2 27 04/22/2012 0600   GLUCOSE 103* 04/22/2012 0600   BUN 23 04/22/2012 0600   CREATININE 1.35* 04/22/2012 0600   CALCIUM 8.8 04/22/2012 0600   PROT 7.0 04/20/2012 0500   ALBUMIN 2.3* 04/20/2012 0500   AST 19 04/20/2012 0500   ALT 12 04/20/2012 0500   ALKPHOS 129* 04/20/2012 0500   BILITOT 0.5 04/20/2012 0500   GFRNONAA 35* 04/22/2012 0600   GFRAA 41* 04/22/2012 0600    GFR Estimated Creatinine Clearance: 29.5 ml/min (by C-G formula based on Cr of 1.35). No results found for this basename: LIPASE:2,AMYLASE:2 in the last 72 hours No results found for this basename: CKTOTAL:3,CKMB:3,CKMBINDEX:3,TROPONINI:3 in the last 72 hours No components found with this basename: POCBNP:3 No results found for this basename: DDIMER:2 in the last 72 hours No results found for this basename: HGBA1C:2 in the last 72 hours No results found for this basename: CHOL:2,HDL:2,LDLCALC:2,TRIG:2,CHOLHDL:2,LDLDIRECT:2 in the last 72 hours No results found for this basename: TSH,T4TOTAL,FREET3,T3FREE,THYROIDAB in the last 72 hours No results found for this basename: VITAMINB12:2,FOLATE:2,FERRITIN:2,TIBC:2,IRON:2,RETICCTPCT:2 in the last  72 hours Coags: No results found for this basename: PT:2,INR:2 in the last 72 hours Microbiology: Recent Results (from the past 240 hour(s))  CULTURE, BLOOD (ROUTINE X 2)     Status: Normal (Preliminary result)   Collection Time   04/19/12  6:20 PM      Component Value Range Status Comment   Specimen Description BLOOD ARM RIGHT   Final    Special Requests BOTTLES DRAWN AEROBIC ONLY 10CC   Final    Culture  Setup Time 04/20/2012 01:52   Final    Culture     Final    Value:        BLOOD CULTURE RECEIVED NO GROWTH TO DATE CULTURE WILL BE HELD FOR 5 DAYS BEFORE ISSUING A FINAL NEGATIVE REPORT   Report Status PENDING   Incomplete   CULTURE, BLOOD (ROUTINE X 2)     Status: Normal (Preliminary result)     Collection Time   04/19/12  6:30 PM      Component Value Range Status Comment   Specimen Description BLOOD HAND LEFT   Final    Special Requests BOTTLES DRAWN AEROBIC ONLY 2CC   Final    Culture  Setup Time 04/20/2012 01:51   Final    Culture     Final    Value:        BLOOD CULTURE RECEIVED NO GROWTH TO DATE CULTURE WILL BE HELD FOR 5 DAYS BEFORE ISSUING A FINAL NEGATIVE REPORT   Report Status PENDING   Incomplete   URINE CULTURE     Status: Normal   Collection Time   04/20/12  1:27 AM      Component Value Range Status Comment   Specimen Description URINE, RANDOM   Final    Special Requests NONE   Final    Culture  Setup Time 04/20/2012 11:34   Final    Colony Count NO GROWTH   Final    Culture NO GROWTH   Final    Report Status 04/21/2012 FINAL   Final   CULTURE, EXPECTORATED SPUTUM-ASSESSMENT     Status: Normal   Collection Time   04/23/12  7:57 AM      Component Value Range Status Comment   Specimen Description SPUTUM   Final    Special Requests Normal   Final    Sputum evaluation     Final    Value: MICROSCOPIC FINDINGS SUGGEST THAT THIS SPECIMEN IS NOT REPRESENTATIVE OF LOWER RESPIRATORY SECRETIONS. PLEASE RECOLLECT.     CALLED TO Gillian Shields, RN 04/23/12 0832 BY K SCHULTZ   Report Status 04/23/2012 FINAL   Final      BRIEF HOSPITAL COURSE:   Principal Problem:  *Community acquired pneumonia -patient was admitted and empirically started on Rocephin and Zithromax, she had significant leukocytosis of 27 thousand and had acute on chronic kidney disease with a creatinine of 2.78 on admission. With IV antibiotics-she made clinical improvement, she does still have some cough, but is significantly better than on admission. Her Leukocytosis has also trended down, and by day of discharge it is 16.6 thousand. -at discharge she will be transitioned to Avelox -she will need a follow up Chest Xray in around 6-8 weeks to document resolution of the infiltrate  Active Problems:  Essential  hypertension, benign -BP is controlled without the need to restart any of her prior anti-hypertensives -resume as needed   Acute kidney failure  -likely pre-renal -creatinine now back to usual baseline-likely has CKD stage 3  AMS  -  daughter on 7/20 expressed concern for speech and cognitive dysfunction that started around 3 weeks back, she wanted to make sure that the patient did not have a CVA, MRI Brain done on 7/21-negative for CVA  -AMS-likely toxic metabolic encephalopathy from PNA-now likely back to her usual baseline   Hypothyroidism  -c/w Levothyroxine  TODAY-DAY OF DISCHARGE:  Subjective:   Kristie Gonzales today has no headache,no chest abdominal pain,no new weakness tingling or numbness, feels much better wants to go home today.   Objective:   Blood pressure 124/71, pulse 78, temperature 97.6 F (36.4 C), temperature source Oral, resp. rate 20, height 5\' 2"  (1.575 m), weight 72.5 kg (159 lb 13.3 oz), SpO2 92.00%.  Intake/Output Summary (Last 24 hours) at 04/23/12 1150 Last data filed at 04/23/12 1000  Gross per 24 hour  Intake    780 ml  Output   1400 ml  Net   -620 ml    Exam Awake Alert, Oriented *3, No new F.N deficits, Normal affect [place].AT,PERRAL Supple Neck,No JVD, No cervical lymphadenopathy appriciated.  Symmetrical Chest wall movement, Good air movement bilaterally, CTAB RRR,No Gallops,Rubs or new Murmurs, No Parasternal Heave +ve B.Sounds, Abd Soft, Non tender, No organomegaly appriciated, No rebound -guarding or rigidity. No Cyanosis, Clubbing or edema, No new Rash or bruise  DISCHARGE CONDITION: Stable  DISPOSITION: SNF   DISCHARGE INSTRUCTIONS:    Activity:  As tolerated with Full fall precautions use walker/cane & assistance as needed  Diet recommendation: Heart Healthy diet  Follow-up Information    Schedule an appointment as soon as possible for a visit with GREEN, Lorenda Ishihara, MD. (after discharge from SNF)    Contact information:    [address] [place] [address] [phone]         Total Time spent on discharge equals 45 minutes.  SignedJeoffrey Massed 04/23/2012 11:50 AM

## 2012-04-23 NOTE — Progress Notes (Signed)
Report called to nurse Billie,LPN  at [facility]. Patient skin WNL

## 2012-04-24 MED FILL — Azithromycin Tab 500 MG: ORAL | Qty: 1 | Status: AC

## 2012-04-26 LAB — CULTURE, BLOOD (ROUTINE X 2): Culture: NO GROWTH

## 2012-05-17 ENCOUNTER — Other Ambulatory Visit: Payer: Self-pay | Admitting: Internal Medicine

## 2012-05-17 ENCOUNTER — Ambulatory Visit
Admission: RE | Admit: 2012-05-17 | Discharge: 2012-05-17 | Disposition: A | Discharge status: Home / Self Care | Payer: Medicare Other | Source: Ambulatory Visit | Attending: Internal Medicine | Admitting: Internal Medicine

## 2012-05-17 DIAGNOSIS — J189 Pneumonia, unspecified organism: Secondary | ICD-10-CM

## 2012-12-09 ENCOUNTER — Other Ambulatory Visit: Payer: Self-pay | Admitting: Surgery

## 2013-07-27 ENCOUNTER — Ambulatory Visit (INDEPENDENT_AMBULATORY_CARE_PROVIDER_SITE_OTHER): Payer: Medicare Other | Admitting: Family Medicine

## 2013-07-27 VITALS — BP 134/76 | HR 108 | Resp 18

## 2013-07-27 DIAGNOSIS — M79609 Pain in unspecified limb: Secondary | ICD-10-CM

## 2013-07-27 DIAGNOSIS — S0180XA Unspecified open wound of other part of head, initial encounter: Secondary | ICD-10-CM

## 2013-07-27 DIAGNOSIS — S51812A Laceration without foreign body of left forearm, initial encounter: Secondary | ICD-10-CM

## 2013-07-27 DIAGNOSIS — S0990XA Unspecified injury of head, initial encounter: Secondary | ICD-10-CM

## 2013-07-27 DIAGNOSIS — S51809A Unspecified open wound of unspecified forearm, initial encounter: Secondary | ICD-10-CM

## 2013-07-27 DIAGNOSIS — S0181XA Laceration without foreign body of other part of head, initial encounter: Secondary | ICD-10-CM

## 2013-07-27 MED ORDER — MUPIROCIN 2 % EX OINT: TOPICAL_OINTMENT | Freq: Three times a day (TID) | CUTANEOUS | Status: DC

## 2013-07-27 NOTE — Progress Notes (Addendum)
Subjective:    Patient ID: Kristie Gonzales, female    DOB: 26-Nov-1925, 79 y.o.   MRN: [id number]  Chief Complaint  Patient presents with  . Laceration    forehead, fell out of bed this am and hit nightstand  . Laceration    L forearm  . Abrasion    R elbow and R knee     HPI Kristie Gonzales is a 79 y.o. female who presents to [facility] with her daughter after falling from bed  this morning when the patient reports that she accidentally rolled off during sleep. Patient reports that she hit the nightstand with associated lacerations to the forehead and left forearm and abrasions to the right elbow and right knee. There is no ecchymosis noted around the wounds.The laceration to the left forearm was wrapped with a bandage and the bleeding of all wounds is well-controlled at this time. Her daughter reports that the patient is currently being treated for a bladder infection with antibiotics. The patient's daughter reports that the patient appears to be acting appropriately for her baseline. Her daughter reports a history of dementia and that the patient's driving is restricted to a small area around her house.  Her demential can vary by day - sometimes has trouble remembering names but is still able to do her own errands and bills. Her sons live very near to her but they are disabled.   Past Medical History  Diagnosis Date  . Thyroid disease   . Hypertension   . Pneumonia 04/19/12  . Hypothyroidism    Current Outpatient Prescriptions on File Prior to Visit  Medication Sig Dispense Refill  . levothyroxine (SYNTHROID, LEVOTHROID) 100 MCG tablet Take 100 mcg by mouth daily.      Marland Kitchen loperamide (IMODIUM) 2 MG capsule Take 2 mg by mouth 4 (four) times daily as needed. diarrhea       No current facility-administered medications on file prior to visit.   Allergies  Allergen Reactions  . Sulfonamide Derivatives    Review of Systems  Constitutional: Negative for fever,  chills, diaphoresis, activity change, appetite change and unexpected weight change.  Eyes: Negative for photophobia, pain and visual disturbance.  Gastrointestinal: Negative for nausea and vomiting.  Musculoskeletal: Negative for gait problem, joint swelling, neck pain and neck stiffness.  Skin: Positive for wound. Negative for color change ( no ecchymosis). Rash:  lacerations to forehead and left forearm, abrasions to right elbow and right knee.  Neurological: Positive for tremors. Negative for dizziness, seizures, syncope, facial asymmetry, speech difficulty, weakness, light-headedness, numbness and headaches.  Hematological: Bruises/bleeds easily.  Psychiatric/Behavioral: Negative for behavioral problems, confusion, sleep disturbance and agitation.     BP 134/76  Pulse 108  Resp 18  SpO2 95%  Objective:   Physical Exam  Nursing note and vitals reviewed. Constitutional: She is oriented to person, place, and time. She appears well-developed and well-nourished. No distress.  HENT:  Head: Normocephalic and atraumatic.  Right Ear: Tympanic membrane, external ear and ear canal normal.  Left Ear: Tympanic membrane, external ear and ear canal normal.  Nose: Nose normal.  Mouth/Throat: Oropharynx is clear and moist. No oropharyngeal exudate.  No evidence of fracture to the nose.   Eyes: Conjunctivae and EOM are normal. Pupils are equal, round, and reactive to light.  Neck: Normal range of motion. Neck supple.  Full range of motion without pain.   Pulmonary/Chest: Effort normal. No respiratory distress.  Abdominal: She exhibits no distension.  Musculoskeletal: Normal range of motion.  Neurological: She is alert and oriented to person, place, and time. She has normal strength. She is not disoriented. No cranial nerve deficit or sensory deficit. She exhibits normal muscle tone. She displays a negative Romberg sign. Coordination and gait normal.  Patient is oriented to time, place, and person.  Normal recent and remote memory. Normal gait. Negative pronator drift. Negative Romberg. Normal calculations and serial sevens.   Skin: Skin is warm and dry.  Psychiatric: She has a normal mood and affect. Her behavior is normal.      Assessment & Plan:  9:38 AM- Discussed that the laceration to the left forearm will need to be repaired with sutures. Discussed that imaging will not be necessary at this time based on her normal neurological examination, but advised the patient's daughter that she will need to be monitored over the next 24 hrs to ensure there are no new or worsening symptoms - watch for sig fatigue, n/v, change of gait, change of mentation - call if there are any questions or concerns for changes and if obvious changes - to ER. Discussed treatment plan with patient at bedside and patient verbalized agreement.   Laceration of forearm, left, initial encounter  Laceration of forehead without complication, initial encounter  Head injury, unspecified  Meds ordered this encounter  Medications  . hydrochlorothiazide (HYDRODIURIL) 25 MG tablet    Sig: Take 25 mg by mouth daily.  . mupirocin ointment (BACTROBAN) 2 %    Sig: Apply topically 3 (three) times daily.    Dispense:  22 g    Refill:  1    I personally performed the services described in this documentation, which was scribed in my presence. The recorded information has been reviewed and considered, and addended by me as needed.  Norberto Sorenson, MD MPH

## 2013-07-27 NOTE — Patient Instructions (Addendum)
WOUND CARE Please return in 10 days to have your stitches/staples removed or sooner if you have concerns. Marland Kitchen Keep area clean and dry for 24 hours. Do not remove bandage, if applied. . After 24 hours, remove bandage and wash wound gently with mild soap and warm water. Reapply a new bandage after cleaning wound, if directed. . Continue daily cleansing with soap and water until stitches/staples are removed. . Do not apply any ointments or creams to the wound while stitches/staples are in place, as this may cause delayed healing. . Notify the office if you experience any of the following signs of infection: Swelling, redness, pus drainage, streaking, fever >101.0 F . Notify the office if you experience excessive bleeding that does not stop after 15-20 minutes of constant, firm pressure.   Head Injury, Adult You have had a head injury that does not appear serious at this time. A concussion is a state of changed mental ability, usually from a blow to the head. You should take clear liquids for the rest of the day and then resume your regular diet. You should not take sedatives or alcoholic beverages for as long as directed by your caregiver after discharge. After injuries such as yours, most problems occur within the first 24 hours. SYMPTOMS These minor symptoms may be experienced after discharge:  Memory difficulties.  Dizziness.  Headaches.  Double vision.  Hearing difficulties.  Depression.  Tiredness.  Weakness.  Difficulty with concentration. If you experience any of these problems, you should not be alarmed. A concussion requires a few days for recovery. Many patients with head injuries frequently experience such symptoms. Usually, these problems disappear without medical care. If symptoms last for more than one day, notify your caregiver. See your caregiver sooner if symptoms are becoming worse rather than better. HOME CARE INSTRUCTIONS   During the next 24 hours you  must stay with someone who can watch you for the warning signs listed below. Although it is unlikely that serious side effects will occur, you should be aware of signs and symptoms which may necessitate your return to this location. Side effects may occur up to 7  10 days following the injury. It is important for you to carefully monitor your condition and contact your caregiver or seek immediate medical attention if there is a change in your condition. SEEK IMMEDIATE MEDICAL CARE IF:   There is confusion or drowsiness.  You can not awaken the injured person.  There is nausea (feeling sick to your stomach) or continued, forceful vomiting.  You notice dizziness or unsteadiness which is getting worse, or inability to walk.  You have convulsions or unconsciousness.  You experience severe, persistent headaches not relieved by over-the-counter or prescription medicines for pain. (Do not take aspirin as this impairs clotting abilities). Take other pain medications only as directed.  You can not use arms or legs normally.  There is clear or bloody discharge from the nose or ears. MAKE SURE YOU:   Understand these instructions.  Will watch your condition.  Will get help right away if you are not doing well or get worse. Document Released: 09/18/2005 Document Revised: 12/11/2011 Document Reviewed: 08/06/2009 [facility] Patient Information 2014 [place], [place].

## 2013-08-05 ENCOUNTER — Ambulatory Visit (INDEPENDENT_AMBULATORY_CARE_PROVIDER_SITE_OTHER): Payer: Medicare Other | Admitting: Family Medicine

## 2013-08-05 VITALS — BP 134/82 | HR 74 | Temp 97.9°F | Resp 16

## 2013-08-05 DIAGNOSIS — S51802D Unspecified open wound of left forearm, subsequent encounter: Secondary | ICD-10-CM

## 2013-08-05 DIAGNOSIS — Z5189 Encounter for other specified aftercare: Secondary | ICD-10-CM

## 2013-08-05 NOTE — Patient Instructions (Signed)
Return if needed

## 2013-08-05 NOTE — Progress Notes (Signed)
Patient is here for her sutures to be removed. She is not having any problems. The glue on the forehead bother her more than the sutures on the arm.  Objective: Sutures were removed from the wound and the arm without difficulty. There was a lot of crusting still and they were embedded. One stitch possibly left a little loop of suture he inside the scan. I cautioned them to watch for any threadbare when the scab works its way off.  The glue and crusting was removed from the forehead without difficulty  Assessment: Status post multiple wounds, sutures removed  Plan: Return as needed

## 2014-05-12 ENCOUNTER — Ambulatory Visit (INDEPENDENT_AMBULATORY_CARE_PROVIDER_SITE_OTHER): Payer: Managed Care, Other (non HMO) | Admitting: Family Medicine

## 2014-05-12 ENCOUNTER — Ambulatory Visit (INDEPENDENT_AMBULATORY_CARE_PROVIDER_SITE_OTHER): Payer: Managed Care, Other (non HMO)

## 2014-05-12 VITALS — BP 130/80 | HR 93 | Temp 98.0°F | Resp 16

## 2014-05-12 DIAGNOSIS — M79609 Pain in unspecified limb: Secondary | ICD-10-CM

## 2014-05-12 DIAGNOSIS — M545 Low back pain, unspecified: Secondary | ICD-10-CM

## 2014-05-12 DIAGNOSIS — Y92009 Unspecified place in unspecified non-institutional (private) residence as the place of occurrence of the external cause: Secondary | ICD-10-CM

## 2014-05-12 DIAGNOSIS — S3013XA Contusion of flank (latus) region, initial encounter: Secondary | ICD-10-CM

## 2014-05-12 DIAGNOSIS — W19XXXA Unspecified fall, initial encounter: Secondary | ICD-10-CM

## 2014-05-12 DIAGNOSIS — M79671 Pain in right foot: Secondary | ICD-10-CM

## 2014-05-12 DIAGNOSIS — S301XXA Contusion of abdominal wall, initial encounter: Secondary | ICD-10-CM

## 2014-05-12 LAB — URIC ACID: URIC ACID, SERUM: 10 mg/dL — AB (ref 2.4–7.0)

## 2014-05-12 LAB — POCT CBC
GRANULOCYTE PERCENT: 81 % — AB (ref 37–80)
HEMATOCRIT: 46.1 % (ref 37.7–47.9)
Hemoglobin: 14.8 g/dL (ref 12.2–16.2)
Lymph, poc: 1.9 (ref 0.6–3.4)
MCH: 30.2 pg (ref 27–31.2)
MCHC: 32.2 g/dL (ref 31.8–35.4)
MCV: 93.8 fL (ref 80–97)
MID (cbc): 0.4 (ref 0–0.9)
MPV: 7.9 fL (ref 0–99.8)
POC Granulocyte: 10 — AB (ref 2–6.9)
POC LYMPH %: 15.5 % (ref 10–50)
POC MID %: 3.5 %M (ref 0–12)
Platelet Count, POC: 311 10*3/uL (ref 142–424)
RBC: 4.92 M/uL (ref 4.04–5.48)
RDW, POC: 13.7 %
WBC: 12.3 10*3/uL — AB (ref 4.6–10.2)

## 2014-05-12 LAB — POCT SEDIMENTATION RATE: POCT SED RATE: 66 mm/h — AB (ref 0–22)

## 2014-05-12 MED ORDER — PREDNISONE 20 MG PO TABS: ORAL_TABLET | ORAL | Status: DC

## 2014-05-12 NOTE — Patient Instructions (Signed)
Tylenol for pain (maximum 2 extra strength 3 times daily)  Use a cane  Apply ice to the back several times daily. It might help the foot also.  Proper the foot up when able  Take the prednisone 2 pills once daily for 3 day   Return at any time if acutely worse  Followup with your primary care doctor in the near future  I do not see any bony injuries, but a radiologist will review the films and if something abnormal shows up we will let you know.

## 2014-05-12 NOTE — Progress Notes (Signed)
Subjective: Patient tripped and fell this morning. Her daughter was there doesn't know exactly what happened, but she got knocked a lot of glass ornament off the table when she was getting back up. He was in the living room. She hit her back with falling. She also hurt her right ankle. No blood noted in her urine. No shortness of breath.  Objective: Elderly lady mild confusion. She is mildly demented but she lives alone. Her clear. Heart regular without murmur. She's got a large abrasion on her right mid back. Chest wall is only minimally tender. No tenderness along the spine. Her extremities have swelling in the right foot and ankle. This looks more chronic. Pulse a little difficult to palpate well. She is tender in the great toe MTP joint.  Assessment: Back pain Rule out rib fracture Contusion back Foot pain Fall   Results for orders placed in visit on 05/12/14  POCT CBC      Result Value Ref Range   WBC 12.3 (*) 4.6 - 10.2 K/uL   Lymph, poc 1.9  0.6 - 3.4   POC LYMPH PERCENT 15.5  10 - 50 %L   MID (cbc) 0.4  0 - 0.9   POC MID % 3.5  0 - 12 %M   POC Granulocyte 10.0 (*) 2 - 6.9   Granulocyte percent 81.0 (*) 37 - 80 %G   RBC 4.92  4.04 - 5.48 M/uL   Hemoglobin 14.8  12.2 - 16.2 g/dL   HCT, POC 45.446.1  09.837.7 - 47.9 %   MCV 93.8  80 - 97 fL   MCH, POC 30.2  27 - 31.2 pg   MCHC 32.2  31.8 - 35.4 g/dL   RDW, POC 11.913.7     Platelet Count, POC 311  142 - 424 K/uL   MPV 7.9  0 - 99.8 fL   [facility] reading (PRIMARY) by  Dr. Alwyn RenHopper Rib x-rays show no fracture. However there are some mild congestive changes present versus old scarring in the lungs. Does appear to have cardiomegaly  Back x-rays of the lumbar spine show old degenerative changes. No acute compression fractures noted.   Foot x-ray negative  Assessment: Pain secondary to fall Abrasion on back Possible gout  Unable to get a urine specimen  Note instructions.  Unable to use NSAIDs or colchicine. We'll give 3 days of  prednisone pending the labs. Tylenol for pain.  Talk to her about using a cane.

## 2014-10-31 ENCOUNTER — Emergency Department (HOSPITAL_COMMUNITY): Payer: Medicare HMO

## 2014-10-31 ENCOUNTER — Encounter (HOSPITAL_COMMUNITY): Payer: Self-pay | Admitting: *Deleted

## 2014-10-31 ENCOUNTER — Observation Stay (HOSPITAL_COMMUNITY)
Admission: EM | Admit: 2014-10-31 | Discharge: 2014-11-03 | Disposition: A | Discharge status: Skilled Nursing Facility | Payer: Medicare HMO | Attending: Internal Medicine | Admitting: Internal Medicine

## 2014-10-31 DIAGNOSIS — I10 Essential (primary) hypertension: Secondary | ICD-10-CM | POA: Diagnosis present

## 2014-10-31 DIAGNOSIS — E039 Hypothyroidism, unspecified: Secondary | ICD-10-CM | POA: Diagnosis not present

## 2014-10-31 DIAGNOSIS — E785 Hyperlipidemia, unspecified: Secondary | ICD-10-CM | POA: Diagnosis not present

## 2014-10-31 DIAGNOSIS — N183 Chronic kidney disease, stage 3 (moderate): Secondary | ICD-10-CM | POA: Diagnosis not present

## 2014-10-31 DIAGNOSIS — R4701 Aphasia: Secondary | ICD-10-CM | POA: Insufficient documentation

## 2014-10-31 DIAGNOSIS — N184 Chronic kidney disease, stage 4 (severe): Secondary | ICD-10-CM | POA: Diagnosis present

## 2014-10-31 DIAGNOSIS — E038 Other specified hypothyroidism: Secondary | ICD-10-CM

## 2014-10-31 DIAGNOSIS — Z8673 Personal history of transient ischemic attack (TIA), and cerebral infarction without residual deficits: Secondary | ICD-10-CM | POA: Insufficient documentation

## 2014-10-31 DIAGNOSIS — Z79899 Other long term (current) drug therapy: Secondary | ICD-10-CM | POA: Insufficient documentation

## 2014-10-31 DIAGNOSIS — G309 Alzheimer's disease, unspecified: Secondary | ICD-10-CM | POA: Diagnosis not present

## 2014-10-31 DIAGNOSIS — W19XXXA Unspecified fall, initial encounter: Secondary | ICD-10-CM | POA: Insufficient documentation

## 2014-10-31 DIAGNOSIS — I129 Hypertensive chronic kidney disease with stage 1 through stage 4 chronic kidney disease, or unspecified chronic kidney disease: Secondary | ICD-10-CM | POA: Insufficient documentation

## 2014-10-31 DIAGNOSIS — G459 Transient cerebral ischemic attack, unspecified: Principal | ICD-10-CM | POA: Diagnosis present

## 2014-10-31 DIAGNOSIS — Z66 Do not resuscitate: Secondary | ICD-10-CM | POA: Insufficient documentation

## 2014-10-31 DIAGNOSIS — F0280 Dementia in other diseases classified elsewhere without behavioral disturbance: Secondary | ICD-10-CM | POA: Insufficient documentation

## 2014-10-31 DIAGNOSIS — S0003XA Contusion of scalp, initial encounter: Secondary | ICD-10-CM | POA: Diagnosis not present

## 2014-10-31 DIAGNOSIS — F0390 Unspecified dementia without behavioral disturbance: Secondary | ICD-10-CM | POA: Diagnosis not present

## 2014-10-31 LAB — PROTIME-INR
INR: 0.96 (ref 0.00–1.49)
PROTHROMBIN TIME: 12.9 s (ref 11.6–15.2)

## 2014-10-31 LAB — COMPREHENSIVE METABOLIC PANEL
ALBUMIN: 3.9 g/dL (ref 3.5–5.2)
ALK PHOS: 71 U/L (ref 39–117)
ALT: 10 U/L (ref 0–35)
ANION GAP: 7 (ref 5–15)
AST: 22 U/L (ref 0–37)
BILIRUBIN TOTAL: 0.6 mg/dL (ref 0.3–1.2)
BUN: 27 mg/dL — ABNORMAL HIGH (ref 6–23)
CO2: 29 mmol/L (ref 19–32)
Calcium: 9.4 mg/dL (ref 8.4–10.5)
Chloride: 106 mmol/L (ref 96–112)
Creatinine, Ser: 2.28 mg/dL — ABNORMAL HIGH (ref 0.50–1.10)
GFR calc Af Amer: 21 mL/min — ABNORMAL LOW (ref >90)
GFR, EST NON AFRICAN AMERICAN: 18 mL/min — AB (ref >90)
Glucose, Bld: 138 mg/dL — ABNORMAL HIGH (ref 70–99)
POTASSIUM: 3.4 mmol/L — AB (ref 3.5–5.1)
Sodium: 142 mmol/L (ref 135–145)
TOTAL PROTEIN: 6.8 g/dL (ref 6.0–8.3)

## 2014-10-31 LAB — CBC
HEMATOCRIT: 43.1 % (ref 36.0–46.0)
HEMOGLOBIN: 14.9 g/dL (ref 12.0–15.0)
MCH: 31.4 pg (ref 26.0–34.0)
MCHC: 34.6 g/dL (ref 30.0–36.0)
MCV: 90.7 fL (ref 78.0–100.0)
PLATELETS: 276 10*3/uL (ref 150–400)
RBC: 4.75 MIL/uL (ref 3.87–5.11)
RDW: 13.6 % (ref 11.5–15.5)
WBC: 11.7 10*3/uL — AB (ref 4.0–10.5)

## 2014-10-31 LAB — I-STAT CHEM 8, ED
BUN: 30 mg/dL — AB (ref 6–23)
CHLORIDE: 102 mmol/L (ref 96–112)
Calcium, Ion: 1.13 mmol/L (ref 1.13–1.30)
Creatinine, Ser: 2.1 mg/dL — ABNORMAL HIGH (ref 0.50–1.10)
GLUCOSE: 135 mg/dL — AB (ref 70–99)
HCT: 48 % — ABNORMAL HIGH (ref 36.0–46.0)
Hemoglobin: 16.3 g/dL — ABNORMAL HIGH (ref 12.0–15.0)
Potassium: 3.4 mmol/L — ABNORMAL LOW (ref 3.5–5.1)
Sodium: 142 mmol/L (ref 135–145)
TCO2: 22 mmol/L (ref 0–100)

## 2014-10-31 LAB — DIFFERENTIAL
Basophils Absolute: 0 10*3/uL (ref 0.0–0.1)
Basophils Relative: 0 % (ref 0–1)
EOS ABS: 0.3 10*3/uL (ref 0.0–0.7)
Eosinophils Relative: 3 % (ref 0–5)
Lymphocytes Relative: 9 % — ABNORMAL LOW (ref 12–46)
Lymphs Abs: 1.1 10*3/uL (ref 0.7–4.0)
Monocytes Absolute: 0.9 10*3/uL (ref 0.1–1.0)
Monocytes Relative: 8 % (ref 3–12)
Neutro Abs: 9.4 10*3/uL — ABNORMAL HIGH (ref 1.7–7.7)
Neutrophils Relative %: 80 % — ABNORMAL HIGH (ref 43–77)

## 2014-10-31 LAB — URINE MICROSCOPIC-ADD ON

## 2014-10-31 LAB — RAPID URINE DRUG SCREEN, HOSP PERFORMED
Amphetamines: NOT DETECTED
BARBITURATES: NOT DETECTED
BENZODIAZEPINES: NOT DETECTED
Cocaine: NOT DETECTED
Opiates: NOT DETECTED
Tetrahydrocannabinol: NOT DETECTED

## 2014-10-31 LAB — APTT: APTT: 30 s (ref 24–37)

## 2014-10-31 LAB — ETHANOL

## 2014-10-31 LAB — I-STAT TROPONIN, ED: TROPONIN I, POC: 0.02 ng/mL (ref 0.00–0.08)

## 2014-10-31 LAB — URINALYSIS, ROUTINE W REFLEX MICROSCOPIC
Bilirubin Urine: NEGATIVE
GLUCOSE, UA: NEGATIVE mg/dL
KETONES UR: NEGATIVE mg/dL
NITRITE: NEGATIVE
PROTEIN: NEGATIVE mg/dL
Specific Gravity, Urine: 1.014 (ref 1.005–1.030)
Urobilinogen, UA: 0.2 mg/dL (ref 0.0–1.0)
pH: 5 (ref 5.0–8.0)

## 2014-10-31 MED ORDER — ASPIRIN 325 MG PO TABS
325.0000 mg | ORAL_TABLET | Freq: Every day | ORAL | Status: DC
  Administered 2014-11-01 – 2014-11-03 (×3): 325 mg via ORAL
  Filled 2014-10-31 (×3): qty 1

## 2014-10-31 MED ORDER — COLCHICINE 0.6 MG PO TABS
0.3000 mg | ORAL_TABLET | Freq: Every day | ORAL | Status: DC
  Administered 2014-11-01 – 2014-11-03 (×3): 0.3 mg via ORAL
  Filled 2014-10-31 (×3): qty 1

## 2014-10-31 MED ORDER — STROKE: EARLY STAGES OF RECOVERY BOOK
Freq: Once | Status: AC
  Administered 2014-10-31: 23:00:00

## 2014-10-31 MED ORDER — HEPARIN SODIUM (PORCINE) 5000 UNIT/ML IJ SOLN
5000.0000 [IU] | Freq: Three times a day (TID) | INTRAMUSCULAR | Status: DC
  Administered 2014-10-31 – 2014-11-03 (×8): 5000 [IU] via SUBCUTANEOUS
  Filled 2014-10-31 (×7): qty 1

## 2014-10-31 MED ORDER — SODIUM CHLORIDE 0.9 % IV SOLN
INTRAVENOUS | Status: AC
  Administered 2014-11-01: 1000 mL via INTRAVENOUS

## 2014-10-31 MED ORDER — ACETAMINOPHEN 325 MG PO TABS: 650.0000 mg | ORAL_TABLET | ORAL | Status: DC | PRN

## 2014-10-31 MED ORDER — LEVOTHYROXINE SODIUM 100 MCG PO TABS
100.0000 ug | ORAL_TABLET | Freq: Every day | ORAL | Status: DC
  Administered 2014-11-01 – 2014-11-03 (×3): 100 ug via ORAL
  Filled 2014-10-31 (×3): qty 1

## 2014-10-31 MED ORDER — LOPERAMIDE HCL 2 MG PO CAPS: 2.0000 mg | ORAL_CAPSULE | Freq: Four times a day (QID) | ORAL | Status: DC | PRN

## 2014-10-31 NOTE — Progress Notes (Signed)
Patient arrived to 4N32 from ED. Family members at bedside. VSS. Telemetry placed. Patient assessed and oriented to room and unit. Will continue to monitor patient closely. Monia PouchShakenna Aunesty Tyson, RN

## 2014-10-31 NOTE — H&P (Signed)
[facility] History and Physical  Kristie Gonzales ZOX:[id number] DOB: 07-28-1926 DOA: 10/31/2014  Referring physician: ED physician PCP: Enrique Sack, MD   Chief Complaint: Fall, found down  HPI:  Kristie Gonzales is an 81yo woman with PMH of HTN, Dementia, Hypothyroidism, CKD who presents after falling and being found down.  Kristie Gonzales has baseline dementia, so much of the history was obtained from daughter and friend who were in the room.  Kristie Gonzales daughter was with her until 12:20pm today and she was normal at that time.  Around 345pm the patient called her friend for help and when she went to check on her, she was down and could not get up.  Her daughter returned and they picked Kristie Gonzales up and noted that she was weaker on the right, particularly the right leg.  Since that time she has also been noted to not be using her right arm much and to be having excessive blinking/closing of the right eye.  There was concern for pronator drift.  Neurology has seen the patient and is following.   Assessment and Plan: TIA (transient ischemic attack) - Neurology following - MRI/MRA ordered - A1C, FLP pending - TTE, CD ordered - Asa 325mg  daily - Telemetry - Neuro checks q4 hours overnight - PT/OT/speech - NPO until swallow screen - Check CK as it is unclear how long she was down - IVF with NS 50cc/hr overnight    Essential hypertension, benign - Hold HCTZ in the setting of possible acute stroke - Permissive HTN - Treat prn if SBP > 180    Hypothyroidism - Check TSH - Continue levothyroxine    Dementia - monitor closely for sundowning or agitation - Not on any medications    CKD (chronic kidney disease) - Per chart review, Cr around 2 at baseline - Monitor closely, fluids as needed  Leukocystosis - Appears chronic in nature, no apparent infection at this time - UA with white cells, but nitrite negative.  She is asymptomatic - Consider CXR in the AM if she has  concerning symptoms.  - Trend   Radiological Exams on Admission: Dg Elbow Complete Right  10/31/2014   CLINICAL DATA:  Patient fell in kitchen landing on right side of body. Elbow bruised and swollen  EXAM: RIGHT ELBOW - COMPLETE 3+ VIEW  COMPARISON:  None.  FINDINGS: Frontal, lateral, and bilateral oblique views were obtained. There is no fracture, dislocation, or effusion. Joint spaces appear intact. There is a small spur arising from the medial distal humeral condyle. No erosive change.  IMPRESSION: Small spur arising from the medial distal humeral condyle. No fracture or dislocation. No appreciable joint effusion.   Electronically Signed   By: Bretta Bang M.D.   On: 10/31/2014 19:47   Ct Head Wo Contrast  10/31/2014   CLINICAL DATA:  Right-sided weakness, status post fall  EXAM: CT HEAD WITHOUT CONTRAST  CT CERVICAL SPINE WITHOUT CONTRAST  TECHNIQUE: Multidetector CT imaging of the head and cervical spine was performed following the standard protocol without intravenous contrast. Multiplanar CT image reconstructions of the cervical spine were also generated.  COMPARISON:  None.  FINDINGS: CT HEAD FINDINGS  There is no evidence of mass effect, midline shift, or extra-axial fluid collections. There is no evidence of a space-occupying lesion or intracranial hemorrhage. There is no evidence of a cortical-based area of acute infarction. There is generalized cerebral atrophy. There is periventricular white matter low attenuation likely secondary to microangiopathy.  The ventricles and  sulci are appropriate for the patient's age. The basal cisterns are patent.  Visualized portions of the orbits are unremarkable. The visualized portions of the paranasal sinuses and mastoid air cells are unremarkable. Cerebrovascular atherosclerotic calcifications are noted.  The osseous structures are unremarkable. There is a right frontal scalp contusion.  CT CERVICAL SPINE FINDINGS  The alignment is anatomic. The  vertebral body heights are maintained. There is no acute fracture. There is no static listhesis. The prevertebral soft tissues are normal. The intraspinal soft tissues are not fully imaged on this examination due to poor soft tissue contrast, but there is no gross soft tissue abnormality.  There is degenerative disc disease at C5-6 and C6-7. There is bilateral uncovertebral degenerative change at C6-7 with bilateral foraminal narrowing. Bilateral mild facet arthropathy and uncovertebral degenerative changes C5-6 resulting in bilateral foraminal narrowing.  There is biapical scarring.There is bilateral carotid artery atherosclerosis.  IMPRESSION: 1. No acute intracranial pathology. 2. No acute osseous injury of the cervical spine.   Electronically Signed   By: Elige Ko   On: 10/31/2014 18:04   Ct Cervical Spine Wo Contrast  10/31/2014   CLINICAL DATA:  Right-sided weakness, status post fall  EXAM: CT HEAD WITHOUT CONTRAST  CT CERVICAL SPINE WITHOUT CONTRAST  TECHNIQUE: Multidetector CT imaging of the head and cervical spine was performed following the standard protocol without intravenous contrast. Multiplanar CT image reconstructions of the cervical spine were also generated.  COMPARISON:  None.  FINDINGS: CT HEAD FINDINGS  There is no evidence of mass effect, midline shift, or extra-axial fluid collections. There is no evidence of a space-occupying lesion or intracranial hemorrhage. There is no evidence of a cortical-based area of acute infarction. There is generalized cerebral atrophy. There is periventricular white matter low attenuation likely secondary to microangiopathy.  The ventricles and sulci are appropriate for the patient's age. The basal cisterns are patent.  Visualized portions of the orbits are unremarkable. The visualized portions of the paranasal sinuses and mastoid air cells are unremarkable. Cerebrovascular atherosclerotic calcifications are noted.  The osseous structures are unremarkable.  There is a right frontal scalp contusion.  CT CERVICAL SPINE FINDINGS  The alignment is anatomic. The vertebral body heights are maintained. There is no acute fracture. There is no static listhesis. The prevertebral soft tissues are normal. The intraspinal soft tissues are not fully imaged on this examination due to poor soft tissue contrast, but there is no gross soft tissue abnormality.  There is degenerative disc disease at C5-6 and C6-7. There is bilateral uncovertebral degenerative change at C6-7 with bilateral foraminal narrowing. Bilateral mild facet arthropathy and uncovertebral degenerative changes C5-6 resulting in bilateral foraminal narrowing.  There is biapical scarring.There is bilateral carotid artery atherosclerosis.  IMPRESSION: 1. No acute intracranial pathology. 2. No acute osseous injury of the cervical spine.   Electronically Signed   By: Elige Ko   On: 10/31/2014 18:04     Code Status: DNR, confirmed with daughter at bedside Family Communication: Pt, daughter and friend Disposition Plan: Admit for further evaluation     Review of Systems, much answered by family due to patient dementia:  Constitutional: Negative for fever, chills and malaise/fatigue. Negative for diaphoresis.  HENT: Negative for hearing loss, ear pain Eyes: Negative for blurred vision, double vision, photophobia Respiratory: Negative for cough, hemoptysis, sputum production Cardiovascular: Negative for chest pain, palpitations, leg swelling.  Gastrointestinal: Negative for nausea, vomiting and abdominal pain, blood in stool and melena.  Genitourinary: Negative for dysuria, urgency,  frequency Musculoskeletal: + for pain in right elbow, fall Negative for myalgias, back pain, joint pain.  Neurological: + for fall, weakness on the right side.  Negative for dizziness and tremors, sensory change, speech change  Psychiatric/Behavioral: The patient is not nervous/anxious.      Past Medical History  Diagnosis  Date  . Thyroid disease   . Hypertension   . Pneumonia 04/19/12  . Hypothyroidism     Past Surgical History  Procedure Laterality Date  . Abdominal hysterectomy    . Appendectomy      Social History:  reports that she has never smoked. She does not have any smokeless tobacco history on file. She reports that she does not drink alcohol or use illicit drugs.  Allergies  Allergen Reactions  . Sulfonamide Derivatives Other (See Comments)    Unknown allergic reaction    Family History  Problem Relation Age of Onset  . Diabetes Son   . Heart disease Son     Prior to Admission medications   Medication Sig Start Date End Date Taking? Authorizing Provider  colchicine 0.6 MG tablet Take 0.3 mg by mouth daily.  10/31/14  Yes Historical Provider, MD  hydrochlorothiazide (HYDRODIURIL) 25 MG tablet Take 12.5 mg by mouth daily.    Yes Historical Provider, MD  levothyroxine (SYNTHROID, LEVOTHROID) 100 MCG tablet Take 100 mcg by mouth daily.   Yes Historical Provider, MD  loperamide (IMODIUM) 2 MG capsule Take 2 mg by mouth 4 (four) times daily as needed for diarrhea or loose stools.    Yes Historical Provider, MD  mupirocin ointment (BACTROBAN) 2 % Apply topically 3 (three) times daily. Patient not taking: Reported on 10/31/2014 07/27/13   Sherren MochaEva N Shaw, MD  predniSONE (DELTASONE) 20 MG tablet Take 2 daily for 3 days for inflammation in foot Patient not taking: Reported on 10/31/2014 05/12/14   Peyton Najjaravid H Hopper, MD    Physical Exam: Filed Vitals:   10/31/14 2015 10/31/14 2030 10/31/14 2045 10/31/14 2100  BP: 143/61 94/74 122/68 149/73  Pulse: 87 87 85 93  Temp:      TempSrc:      Resp: 19  23 16   SpO2: 95% 95% 95% 96%    Physical Exam  Constitutional: Elderly woman, NAD HENT: + abrasion to right temple. Oropharynx is clear and moist.  Eyes: Conjunctivae somewhat injected.  EOM are normal. PERRLA, no scleral icterus.  Neck: Normal ROM. Neck supple.  CVS: Normal rate, RR, S1/S2 +, no  murmurs Pulmonary: Effort and breath sounds normal, rhonchi, wheezes, rales.  Abdominal: Soft. BS +,  no distension, tenderness, rebound or guarding.  Musculoskeletal: No edema and no tenderness.  + bruising to right elbow and right hand Neuro: Alert. Muscle tone was normal, she had normal motion and strength in the bilateral LE and LUE.  She had some difficulty following commands for the RUE, but that may have been due to pain.  When directed, she could move that arm easily and had good grip strength.  She had difficulty with smile, but CN otherwise intact.  She closes the right eye with speaking, but her speech is fluent.  She has some word finding difficulties at baseline due to dementia.  ? Pronator drift on the right, could be related to arm pain.  Skin: Skin is warm and dry. No rash noted  Labs on Admission:  Basic Metabolic Panel:  Recent Labs Lab 10/31/14 1751 10/31/14 1801  NA 142 142  K 3.4* 3.4*  CL 106 102  CO2 29  --   GLUCOSE 138* 135*  BUN 27* 30*  CREATININE 2.28* 2.10*  CALCIUM 9.4  --    Liver Function Tests:  Recent Labs Lab 10/31/14 1751  AST 22  ALT 10  ALKPHOS 71  BILITOT 0.6  PROT 6.8  ALBUMIN 3.9  CBC:  Recent Labs Lab 10/31/14 1751 10/31/14 1801  WBC 11.7*  --   NEUTROABS 9.4*  --   HGB 14.9 16.3*  HCT 43.1 48.0*  MCV 90.7  --   PLT 276  --     EKG: Normal sinus rhythm, likely incomplete fascicular block, PAC.  I do not have an earlier one to compare, but she has no cardiac symptoms.   Debe Coder, MD [phone]  If 7PM-7AM, please contact night-coverage [email or web address] Password [facility] 10/31/2014, 9:34 PM

## 2014-10-31 NOTE — ED Notes (Signed)
Per EMS- pt was reported to fall today. Pt reports rt elbow pain. Daughter states that she looks like she is leaning to the rt. Pt was LSN at 1130 by her daughter. Pt lives alone. Pt has equal grips and ambulatory with EMS. Pt noted to have a hematoma to rt side of head. Pt has hx of dementia.

## 2014-10-31 NOTE — ED Notes (Signed)
Pt transported to CT ?

## 2014-10-31 NOTE — Consult Note (Signed)
Neurology Consultation Reason for Consult: Right-sided weakness Referring Physician: Merlene LaughterMcManus, K  CC: Right-sided weakness  History is obtained from: Family  HPI: Kristie Gonzales is a 81 y.o. female who was in her normal state of health at 11:30, then around 3:45 PM the patient called her friend and the friend was unable to hear anything. Her daughter whenever to her and found her unable to speak and with right-sided weakness. She does not remember these events, however this is unreliable given that she is demented and does not remember events on a regular basis.  She has since returned to baseline.  Last known well: 11:30 AM TPA given: No improving symptoms  ROS: A 14 point ROS was performed and is negative except as noted in the HPI.   Past Medical History  Diagnosis Date  . Thyroid disease   . Hypertension   . Pneumonia 04/19/12  . Hypothyroidism     Family History: Diabetes  Social History: Tob: Denies  Exam: Current vital signs: BP 140/68 mmHg  Pulse 90  Temp(Src) 98.2 F (36.8 C) (Oral)  Resp 19  SpO2 96% Vital signs in last 24 hours: Temp:  [98.2 F (36.8 C)] 98.2 F (36.8 C) (01/30 1755) Pulse Rate:  [90-106] 90 (01/30 1945) Resp:  [18-26] 19 (01/30 1945) BP: (140-161)/(65-68) 140/68 mmHg (01/30 1945) SpO2:  [96 %-97 %] 96 % (01/30 1945)   Physical Exam  Constitutional: Appears well-developed and well-nourished.  Psych: Affect appropriate to situation Eyes: No scleral injection HENT: No OP obstrucion Head: Normocephalic.  Cardiovascular: Normal rate and regular rhythm.  Respiratory: Effort normal and breath sounds normal to anterior ascultation GI: Soft.  No distension. There is no tenderness.  Skin: WDI  Neuro: Mental Status: Patient is awake, alert, gives correct month and year Patient is able to give a clear and coherent history. She has a slow dysarthric speech Cranial Nerves: II: Visual Fields are full. Pupils are equal, round, and  reactive to light.   III,IV, VI: EOMI without ptosis or diploplia.  V: Facial sensation is symmetric to temperature VII: Facial movement is equal VIII: hearing is intact to voice X: Uvula elevates symmetrically XI: Shoulder shrug is symmetric. XII: tongue is midline without atrophy or fasciculations.  Motor: Tone is normal. Bulk is normal. 5/5 strength was present in all four extremities.  Sensory: Sensation is symmetric to light touch and temperature in the arms and legs. Cerebellar: FNF and HKS are intact bilaterally         I have reviewed labs in epic and the results pertinent to this consultation are: Elevated creatinine  I have reviewed the images obtained: CT head-negative acute  Impression: 81 year old female with transient right-sided weakness and aphasia. This is in the setting of a history of dementia. I discussed how much workup the patient would want with the family. She would not want surgery, but anything short of this she would pursue. She was on no antiplatelet prior to this.  Recommendations: 1. HgbA1c, fasting lipid panel 2. MRI, MRA  of the brain without contrast 3. Frequent neuro checks 4. Echocardiogram 5. Carotid dopplers  6. Prophylactic therapy-Antiplatelet med: Aspirin - dose 325mg  PO or 300mg  PR 7. Risk factor modification 8. Telemetry monitoring 9. PT consult, OT consult, Speech consult    Ritta SlotMcNeill Kesean Serviss, MD [facility] [phone]  If 7pm- 7am, please page neurology on call as listed in AMION.

## 2014-10-31 NOTE — ED Notes (Signed)
PA at bedside.

## 2014-10-31 NOTE — ED Provider Notes (Signed)
CSN: [id number]     Arrival date & time 10/31/14  1724 History   First MD Initiated Contact with Patient 10/31/14 1727     Chief Complaint  Patient presents with  . Fall     (Consider location/radiation/quality/duration/timing/severity/associated sxs/prior Treatment) Patient is a 81 y.o. female presenting with fall. The history is provided by the patient and medical records. No language interpreter was used.  Fall     Kristie Gonzales is a 81 y.o. female  with a hx of hypothyroid, HTN, gout, dementia presents to the Emergency Department complaining of unknown onset confusion, right arm drift and gait disturbance.  Pt's daughter at bedside reports last seen normal at 11:30 this morning. At 3:45pm patient called her friend but friend was unable to hear her on the phone. Upon reaching her side at approximately 4:30pm patient was found supine on the kitchen floor, aphasic.  Patient's daughter reports that when they walked her at home she was listing to the right which is abnormal for the patient.  She reports that her speech is always "mush mouth" but it seems slower today. She also reports drawling of the right side of the face during conversation which is abnormal for the patient.  Patient without history of CVA.  Past Medical History  Diagnosis Date  . Thyroid disease   . Hypertension   . Pneumonia 04/19/12  . Hypothyroidism    Past Surgical History  Procedure Laterality Date  . Abdominal hysterectomy    . Appendectomy     Family History  Problem Relation Age of Onset  . Diabetes Son   . Heart disease Son    History  Substance Use Topics  . Smoking status: Never Smoker   . Smokeless tobacco: Not on file  . Alcohol Use: No   OB History    No data available     Review of Systems  Unable to perform ROS: Dementia      Allergies  Sulfonamide derivatives  Home Medications   Prior to Admission medications   Medication Sig Start Date End Date Taking? Authorizing Provider   hydrochlorothiazide (HYDRODIURIL) 25 MG tablet Take 25 mg by mouth daily.    Historical Provider, MD  levothyroxine (SYNTHROID, LEVOTHROID) 100 MCG tablet Take 100 mcg by mouth daily.    Historical Provider, MD  loperamide (IMODIUM) 2 MG capsule Take 2 mg by mouth 4 (four) times daily as needed. diarrhea    Historical Provider, MD  mupirocin ointment (BACTROBAN) 2 % Apply topically 3 (three) times daily. 07/27/13   Sherren Mocha, MD  predniSONE (DELTASONE) 20 MG tablet Take 2 daily for 3 days for inflammation in foot 05/12/14   Peyton Najjar, MD   BP 140/68 mmHg  Pulse 90  Temp(Src) 98.2 F (36.8 C) (Oral)  Resp 19  SpO2 96% Physical Exam  Constitutional: She is oriented to person, place, and time. She appears well-developed and well-nourished. No distress.  HENT:  Head: Normocephalic.  Right Ear: Tympanic membrane, external ear and ear canal normal.  Left Ear: Tympanic membrane, external ear and ear canal normal.  Nose: Nose normal. No epistaxis. Right sinus exhibits no maxillary sinus tenderness and no frontal sinus tenderness. Left sinus exhibits no maxillary sinus tenderness and no frontal sinus tenderness.  Mouth/Throat: Uvula is midline, oropharynx is clear and moist and mucous membranes are normal. Mucous membranes are not pale and not cyanotic. No oropharyngeal exudate, posterior oropharyngeal edema, posterior oropharyngeal erythema or tonsillar abscesses.  Contusion to the left  forehead  Eyes: Conjunctivae and EOM are normal. Pupils are equal, round, and reactive to light. No scleral icterus.  No horizontal, vertical or rotational nystagmus  Neck: Normal range of motion and full passive range of motion without pain. Neck supple.  Full active and passive ROM without pain No midline or paraspinal tenderness No nuchal rigidity or meningeal signs  Cardiovascular: Normal rate, regular rhythm, normal heart sounds and intact distal pulses.   No murmur heard. Pulmonary/Chest: Effort  normal and breath sounds normal. No stridor. No respiratory distress. She has no wheezes. She has no rales.  Clear and equal breath sounds without focal wheezes, rhonchi, rales  Abdominal: Soft. Bowel sounds are normal. She exhibits no distension. There is no tenderness. There is no rebound and no guarding.  Musculoskeletal: Normal range of motion.  Swelling and ecchymosis to the right elbow and right 5th MCP  Lymphadenopathy:    She has no cervical adenopathy.  Neurological: She is alert and oriented to person, place, and time. She exhibits normal muscle tone.  Reflex Scores:      Patellar reflexes are 2+ on the right side and 2+ on the left side. Mental Status:  Alert, garbled speech, unable to follow 2 step commands and follows only sone one-step commands Cranial Nerves:  pupils equal, round, reactive to light, ptosis not present, extra-ocular motions intact bilaterally, patient does not smile Motor:  Right arm drift after multiple attempts, no leg drift strong and equal grip strength and dorsiflexion/plantar flexion Deep Tendon Reflexes: 2+ BLE  Cerebellar: slow finger-to-finger without past pointing bilateral upper extremities Gait: gait deferred CV: distal pulses palpable throughout   Skin: Skin is warm and dry. No rash noted. She is not diaphoretic.  Psychiatric: She has a normal mood and affect. Her behavior is normal. Judgment and thought content normal.  Nursing note and vitals reviewed.   ED Course  Procedures (including critical care time) Labs Review Labs Reviewed  CBC - Abnormal; Notable for the following:    WBC 11.7 (*)    All other components within normal limits  DIFFERENTIAL - Abnormal; Notable for the following:    Neutrophils Relative % 80 (*)    Neutro Abs 9.4 (*)    Lymphocytes Relative 9 (*)    All other components within normal limits  COMPREHENSIVE METABOLIC PANEL - Abnormal; Notable for the following:    Potassium 3.4 (*)    Glucose, Bld 138 (*)     BUN 27 (*)    Creatinine, Ser 2.28 (*)    GFR calc non Af Amer 18 (*)    GFR calc Af Amer 21 (*)    All other components within normal limits  URINALYSIS, ROUTINE W REFLEX MICROSCOPIC - Abnormal; Notable for the following:    Hgb urine dipstick SMALL (*)    Leukocytes, UA MODERATE (*)    All other components within normal limits  URINE MICROSCOPIC-ADD ON - Abnormal; Notable for the following:    Casts GRANULAR CAST (*)    All other components within normal limits  I-STAT CHEM 8, ED - Abnormal; Notable for the following:    Potassium 3.4 (*)    BUN 30 (*)    Creatinine, Ser 2.10 (*)    Glucose, Bld 135 (*)    Hemoglobin 16.3 (*)    HCT 48.0 (*)    All other components within normal limits  ETHANOL  PROTIME-INR  APTT  URINE RAPID DRUG SCREEN (HOSP PERFORMED)  I-STAT TROPOININ, ED  Imaging Review Dg Elbow Complete Right  10/31/2014   CLINICAL DATA:  Patient fell in kitchen landing on right side of body. Elbow bruised and swollen  EXAM: RIGHT ELBOW - COMPLETE 3+ VIEW  COMPARISON:  None.  FINDINGS: Frontal, lateral, and bilateral oblique views were obtained. There is no fracture, dislocation, or effusion. Joint spaces appear intact. There is a small spur arising from the medial distal humeral condyle. No erosive change.  IMPRESSION: Small spur arising from the medial distal humeral condyle. No fracture or dislocation. No appreciable joint effusion.   Electronically Signed   By: Bretta Bang M.D.   On: 10/31/2014 19:47   Ct Head Wo Contrast  10/31/2014   CLINICAL DATA:  Right-sided weakness, status post fall  EXAM: CT HEAD WITHOUT CONTRAST  CT CERVICAL SPINE WITHOUT CONTRAST  TECHNIQUE: Multidetector CT imaging of the head and cervical spine was performed following the standard protocol without intravenous contrast. Multiplanar CT image reconstructions of the cervical spine were also generated.  COMPARISON:  None.  FINDINGS: CT HEAD FINDINGS  There is no evidence of mass effect,  midline shift, or extra-axial fluid collections. There is no evidence of a space-occupying lesion or intracranial hemorrhage. There is no evidence of a cortical-based area of acute infarction. There is generalized cerebral atrophy. There is periventricular white matter low attenuation likely secondary to microangiopathy.  The ventricles and sulci are appropriate for the patient's age. The basal cisterns are patent.  Visualized portions of the orbits are unremarkable. The visualized portions of the paranasal sinuses and mastoid air cells are unremarkable. Cerebrovascular atherosclerotic calcifications are noted.  The osseous structures are unremarkable. There is a right frontal scalp contusion.  CT CERVICAL SPINE FINDINGS  The alignment is anatomic. The vertebral body heights are maintained. There is no acute fracture. There is no static listhesis. The prevertebral soft tissues are normal. The intraspinal soft tissues are not fully imaged on this examination due to poor soft tissue contrast, but there is no gross soft tissue abnormality.  There is degenerative disc disease at C5-6 and C6-7. There is bilateral uncovertebral degenerative change at C6-7 with bilateral foraminal narrowing. Bilateral mild facet arthropathy and uncovertebral degenerative changes C5-6 resulting in bilateral foraminal narrowing.  There is biapical scarring.There is bilateral carotid artery atherosclerosis.  IMPRESSION: 1. No acute intracranial pathology. 2. No acute osseous injury of the cervical spine.   Electronically Signed   By: Elige Ko   On: 10/31/2014 18:04   Ct Cervical Spine Wo Contrast  10/31/2014   CLINICAL DATA:  Right-sided weakness, status post fall  EXAM: CT HEAD WITHOUT CONTRAST  CT CERVICAL SPINE WITHOUT CONTRAST  TECHNIQUE: Multidetector CT imaging of the head and cervical spine was performed following the standard protocol without intravenous contrast. Multiplanar CT image reconstructions of the cervical spine were  also generated.  COMPARISON:  None.  FINDINGS: CT HEAD FINDINGS  There is no evidence of mass effect, midline shift, or extra-axial fluid collections. There is no evidence of a space-occupying lesion or intracranial hemorrhage. There is no evidence of a cortical-based area of acute infarction. There is generalized cerebral atrophy. There is periventricular white matter low attenuation likely secondary to microangiopathy.  The ventricles and sulci are appropriate for the patient's age. The basal cisterns are patent.  Visualized portions of the orbits are unremarkable. The visualized portions of the paranasal sinuses and mastoid air cells are unremarkable. Cerebrovascular atherosclerotic calcifications are noted.  The osseous structures are unremarkable. There is a right frontal scalp  contusion.  CT CERVICAL SPINE FINDINGS  The alignment is anatomic. The vertebral body heights are maintained. There is no acute fracture. There is no static listhesis. The prevertebral soft tissues are normal. The intraspinal soft tissues are not fully imaged on this examination due to poor soft tissue contrast, but there is no gross soft tissue abnormality.  There is degenerative disc disease at C5-6 and C6-7. There is bilateral uncovertebral degenerative change at C6-7 with bilateral foraminal narrowing. Bilateral mild facet arthropathy and uncovertebral degenerative changes C5-6 resulting in bilateral foraminal narrowing.  There is biapical scarring.There is bilateral carotid artery atherosclerosis.  IMPRESSION: 1. No acute intracranial pathology. 2. No acute osseous injury of the cervical spine.   Electronically Signed   By: Elige KoHetal  Patel   On: 10/31/2014 18:04     EKG Interpretation   Date/Time:  Saturday October 31 2014 18:23:06 EST Ventricular Rate:  93 PR Interval:  195 QRS Duration: 102 QT Interval:  395 QTC Calculation: 491 R Axis:   -72 Text Interpretation:  Sinus rhythm Atrial premature complex Probable left   atrial enlargement Left axis deviation Incomplete RBBB and LAFB Possible  Anterior infarct , age undetermined When compared with ECG of 05/23/2006 No  significant change was found Confirmed by [facility]CC[facility]  MD, Nicholos JohnsKATHLEEN [phone])  on 10/31/2014 6:58:59 PM      CRITICAL CARE Performed by: Dierdre ForthMuthersbaugh, Sharnese Heath Total critical care time: 45min Critical care time was exclusive of separately billable procedures and treating other patients. Critical care was necessary to treat or prevent imminent or life-threatening deterioration. Critical care was time spent personally by me on the following activities: development of treatment plan with patient and/or surrogate as well as nursing, discussions with consultants, evaluation of patient's response to treatment, examination of patient, obtaining history from patient or surrogate, ordering and performing treatments and interventions, ordering and review of laboratory studies, ordering and review of radiographic studies, pulse oximetry and re-evaluation of patient's condition.   MDM   Final diagnoses:  Transient cerebral ischemia, unspecified transient cerebral ischemia type  Essential hypertension, benign   Kristie Gonzales presents with right arm drift, fall and known last normal at 6 hours prior.  Code stroke called.    6:19 PM Pt evaluated by neurology, Dr, Amada JupiterKirkpatrick.  Pt with normal CT; likely TIA at this time.  Pt will come in for TIA work-up.    8:53 PM Labs reassuring.  No UTI.  BUN and Creatinine at baseline.  Mild leukocytosis without anemia.  NO fracture of the right elbow.  Pt will be admitted.  8:55 PM Discussed with Dr, Criselda PeachesMullen [facility].    The patient was discussed with and seen by Dr. Clarene DukeMcManus who agrees with the treatment plan.   Dahlia ClientHannah Zurri Rudden, PA-C 10/31/14 2056  Samuel JesterKathleen McManus, DO 11/02/14 1404

## 2014-10-31 NOTE — ED Notes (Signed)
Pt noted to to have rt arm drift. No other deficits noted. PA requests to activate code stroke.

## 2014-11-01 ENCOUNTER — Observation Stay (HOSPITAL_COMMUNITY): Payer: Medicare HMO

## 2014-11-01 DIAGNOSIS — N184 Chronic kidney disease, stage 4 (severe): Secondary | ICD-10-CM

## 2014-11-01 DIAGNOSIS — G451 Carotid artery syndrome (hemispheric): Secondary | ICD-10-CM

## 2014-11-01 LAB — BASIC METABOLIC PANEL
Anion gap: 8 (ref 5–15)
BUN: 21 mg/dL (ref 6–23)
CALCIUM: 9 mg/dL (ref 8.4–10.5)
CO2: 26 mmol/L (ref 19–32)
Chloride: 104 mmol/L (ref 96–112)
Creatinine, Ser: 1.79 mg/dL — ABNORMAL HIGH (ref 0.50–1.10)
GFR calc Af Amer: 28 mL/min — ABNORMAL LOW (ref >90)
GFR, EST NON AFRICAN AMERICAN: 25 mL/min — AB (ref >90)
Glucose, Bld: 103 mg/dL — ABNORMAL HIGH (ref 70–99)
Potassium: 4.1 mmol/L (ref 3.5–5.1)
SODIUM: 138 mmol/L (ref 135–145)

## 2014-11-01 LAB — CBC
HCT: 43 % (ref 36.0–46.0)
HEMOGLOBIN: 14.8 g/dL (ref 12.0–15.0)
MCH: 31.4 pg (ref 26.0–34.0)
MCHC: 34.4 g/dL (ref 30.0–36.0)
MCV: 91.3 fL (ref 78.0–100.0)
Platelets: 284 10*3/uL (ref 150–400)
RBC: 4.71 MIL/uL (ref 3.87–5.11)
RDW: 13.6 % (ref 11.5–15.5)
WBC: 9.6 10*3/uL (ref 4.0–10.5)

## 2014-11-01 LAB — LIPID PANEL
Cholesterol: 207 mg/dL — ABNORMAL HIGH (ref 0–200)
HDL: 45 mg/dL (ref >39)
LDL Cholesterol: 136 mg/dL — ABNORMAL HIGH (ref 0–99)
Total CHOL/HDL Ratio: 4.6 RATIO
Triglycerides: 128 mg/dL (ref <150)
VLDL: 26 mg/dL (ref 0–40)

## 2014-11-01 LAB — RAPID URINE DRUG SCREEN, HOSP PERFORMED
Amphetamines: NOT DETECTED
Barbiturates: NOT DETECTED
Benzodiazepines: NOT DETECTED
Cocaine: NOT DETECTED
Opiates: NOT DETECTED
Tetrahydrocannabinol: NOT DETECTED

## 2014-11-01 LAB — TSH: TSH: 0.522 u[IU]/mL (ref 0.350–4.500)

## 2014-11-01 LAB — CK: CK TOTAL: 233 U/L — AB (ref 7–177)

## 2014-11-01 LAB — GLUCOSE, CAPILLARY: GLUCOSE-CAPILLARY: 95 mg/dL (ref 70–99)

## 2014-11-01 MED ORDER — ATORVASTATIN CALCIUM 40 MG PO TABS
40.0000 mg | ORAL_TABLET | Freq: Every day | ORAL | Status: DC
  Administered 2014-11-01 – 2014-11-02 (×2): 40 mg via ORAL
  Filled 2014-11-01 (×2): qty 1

## 2014-11-01 NOTE — Evaluation (Signed)
Physical Therapy Evaluation Patient Details Name: Kristie MannsLillie K Merryfield MRN: [id number] DOB: 06/30/1926 Today's Date: 11/01/2014   History of Present Illness  Impression: 81 year old female with transient right-sided weakness and aphasia. This is in the setting of a history of dementia.  Past Medical History  Diagnosis Date  . Thyroid disease   . Hypertension   . Pneumonia 04/19/12  . Hypothyroidism    Past Surgical History  Procedure Laterality Date  . Abdominal hysterectomy    . Appendectomy       Clinical Impression  Pt admitted with above diagnosis. Pt currently with functional limitations due to the deficits listed below (see PT Problem List).  Pt will benefit from skilled PT to increase their independence and safety with mobility to allow discharge to the venue listed below.    Lengthy discussion with Pt's Daughter, June, who indicatd pt has been undergiong a slow, but noticeable decline in mobility and functioning, enough to where she is open to considering Assisted Living for a longer-term, safer, more sustainable living situation for Ms. Rumery; June also is considering [facility], which is a good option too -- if pt dc's home, though, will recommend 24 hour assist.  Worth considering SNF for post-acute rehab to maximize independence and safety with mobility, and serve as a bridge to back home with [facility] and more asist, or ALF    Follow Up Recommendations SNF    Equipment Recommendations  Rolling walker with 5" wheels;3in1 (PT)    Recommendations for Other Services OT consult  SW consult    Precautions / Restrictions Precautions Precautions: Fall      Mobility  Bed Mobility Overal bed mobility: Needs Assistance Bed Mobility: Supine to Sit     Supine to sit: Min assist     General bed mobility comments: Used bedrails to pull to sit; min assist to square off hips at EOB using bed pad  Transfers Overall transfer level: Needs assistance Equipment used: Rolling walker  (2 wheeled) Transfers: Sit to/from Stand Sit to Stand: Mod assist         General transfer comment: mod assist to power-up from low bed  Ambulation/Gait Ambulation/Gait assistance: Min guard Ambulation Distance (Feet): 5 Feet Assistive device: Rolling walker (2 wheeled) Gait Pattern/deviations: Step-through pattern     General Gait Details: Overall steady on her feet using RW  Stairs            Wheelchair Mobility    Modified Rankin (Stroke Patients Only) Modified Rankin (Stroke Patients Only) Pre-Morbid Rankin Score: Moderate disability Modified Rankin: Moderately severe disability     Balance Overall balance assessment: Needs assistance           Standing balance-Leahy Scale: Poor                               Pertinent Vitals/Pain Pain Assessment: Faces Faces Pain Scale: Hurts little more Pain Location: Describes pain in her RUE and RLE Pain Descriptors / Indicators: Discomfort Pain Intervention(s): Limited activity within patient's tolerance;Monitored during session;Repositioned    Home Living Family/patient expects to be discharged to:: Private residence Living Arrangements: Alone Available Help at Discharge: Family;Friend(s);Available PRN/intermittently Type of Home: Apartment Home Access: Stairs to enter Entrance Stairs-Rails: Right Entrance Stairs-Number of Steps: 3 Home Layout: One level Home Equipment: Cane - single point (lift chair)      Prior Function Level of Independence: Needs assistance   Gait / Transfers Assistance Needed: Daughter states standing  up has become more difficult;      Comments: Daughter and close friend assist with grocery shopping, IADLs; Daughter describes a recent decline that neighbors have noted over the past few months     Hand Dominance        Extremity/Trunk Assessment   Upper Extremity Assessment: Generalized weakness           Lower Extremity Assessment: Generalized weakness          Communication   Communication: HOH;Expressive difficulties  Cognition Arousal/Alertness: Awake/alert Behavior During Therapy: WFL for tasks assessed/performed Overall Cognitive Status: History of cognitive impairments - at baseline       Memory: Decreased short-term memory (with history of dementia)              General Comments General comments (skin integrity, edema, etc.): Lengthy discussion with Daughter, June, re: dc planning    Exercises        Assessment/Plan    PT Assessment Patient needs continued PT services  PT Diagnosis Difficulty walking;Generalized weakness   PT Problem List Decreased strength;Decreased range of motion;Decreased activity tolerance;Decreased balance;Decreased mobility;Decreased coordination;Decreased cognition;Decreased knowledge of use of DME;Decreased safety awareness  PT Treatment Interventions DME instruction;Gait training;Functional mobility training;Stair training;Therapeutic activities;Therapeutic exercise;Balance training;Neuromuscular re-education;Cognitive remediation;Patient/family education   PT Goals (Current goals can be found in the Care Plan section) Acute Rehab PT Goals Patient Stated Goal: did not state PT Goal Formulation: With patient/family Time For Goal Achievement: 11/15/14 Potential to Achieve Goals: Fair    Frequency Min 3X/week   Barriers to discharge Decreased caregiver support      Co-evaluation               End of Session Equipment Utilized During Treatment: Gait belt Activity Tolerance: Patient tolerated treatment well Patient left: in chair;with call bell/phone within reach;with family/visitor present Nurse Communication: Mobility status    Functional Assessment Tool Used: Clinical Judgement Functional Limitation: Mobility: Walking and moving around Mobility: Walking and Moving Around Current Status [phone]): At least 20 percent but less than 40 percent impaired, limited or  restricted Mobility: Walking and Moving Around Goal Status [phone]): 0 percent impaired, limited or restricted    Time: 1135-1203 PT Time Calculation (min) (ACUTE ONLY): 28 min   Charges:   PT Evaluation $Initial PT Evaluation Tier I: 1 Procedure PT Treatments $Therapeutic Activity: 8-22 mins   PT G Codes:   PT G-Codes **NOT FOR INPATIENT CLASS** Functional Assessment Tool Used: Clinical Judgement Functional Limitation: Mobility: Walking and moving around Mobility: Walking and Moving Around Current Status (A2130): At least 20 percent but less than 40 percent impaired, limited or restricted Mobility: Walking and Moving Around Goal Status [phone]): 0 percent impaired, limited or restricted    Van Clines Hamff 11/01/2014, 1:54 PM  Van Clines, PT  Acute Rehabilitation Services Pager [phone] Office [phone]

## 2014-11-01 NOTE — Evaluation (Signed)
Clinical/Bedside Swallow Evaluation Patient Details  Name: Starling MannsLillie K Hunsinger MRN: [id number] Date of Birth: 12/02/1925  Today's Date: 11/01/2014 Time: SLP Start Time (ACUTE ONLY): 0933 SLP Stop Time (ACUTE ONLY): 0943 SLP Time Calculation (min) (ACUTE ONLY): 10 min  Past Medical History:  Past Medical History  Diagnosis Date  . Thyroid disease   . Hypertension   . Pneumonia 04/19/12  . Hypothyroidism    Past Surgical History:  Past Surgical History  Procedure Laterality Date  . Abdominal hysterectomy    . Appendectomy     HPI:  81 year old female with transient right-sided weakness and aphasia. This is in the setting of a history of dementia. Pt failed RN stroke swallow screen.  No acute intracranial process per MRI. Dtr reports independent with eating; occasional coughing with meals.     Assessment / Plan / Recommendation Clinical Impression  Pt presents with normal oropharyngeal swallow with active mastication, consistent swallow response, no s/s of aspiration.  Pt is able to feed herself; has intermittent difficulty following commands, but this is baseline per her dtr.  Recommend resuming a regular diet, thin liquids, meds whole with liquids.  No SLP f/u is warranted - dtr agrees.     Aspiration Risk  Mild    Diet Recommendation Regular;Thin liquid   Liquid Administration via: Cup;Straw Medication Administration: Whole meds with liquid Supervision: Patient able to self feed    Other  Recommendations Oral Care Recommendations: Oral care BID   Follow Up Recommendations  None      Swallow Study Prior Functional Status       General Date of Onset: 10/31/14 HPI: 81 year old female with transient right-sided weakness and aphasia. This is in the setting of a history of dementia. Pt failed RN stroke swallow screen.  No acute intracranial process per MRI. Dtr reports independent with eating; occasional coughing with meals.   Type of Study: Bedside swallow evaluation Previous  Swallow Assessment: none per records Diet Prior to this Study: NPO Temperature Spikes Noted: No Respiratory Status: Room air Behavior/Cognition: Alert;Cooperative Self-Feeding Abilities: Able to feed self Patient Positioning: Upright in bed Baseline Vocal Quality: Clear Volitional Cough: Strong Volitional Swallow: Able to elicit    Oral/Motor/Sensory Function Overall Oral Motor/Sensory Function: Appears within functional limits for tasks assessed   Ice Chips Ice chips: Within functional limits Presentation: Spoon   Thin Liquid Thin Liquid: Within functional limits Presentation: Cup;Self Fed    Nectar Thick Nectar Thick Liquid: Not tested   Honey Thick Honey Thick Liquid: Not tested   Puree Puree: Within functional limits Presentation: Self Fed;Spoon   Solid  Damoni Erker L. [place]ure, [place] CCC/SLP Pager [phone]     Solid: Within functional limits Presentation: Self Fed       Blenda MountsCouture, Siani Utke Laurice 11/01/2014,9:52 AM

## 2014-11-01 NOTE — Progress Notes (Signed)
Utilization Review Completed.   Miller Limehouse, RN, BSN Nurse Case Manager  

## 2014-11-01 NOTE — Progress Notes (Signed)
PROGRESS NOTE  Kristie DINGWALL ZOX:[id number] DOB: 03/29/1926 DOA: 10/31/2014 PCP: Enrique Sack, MD  HPI: Ms. Wartman is an 81yo woman with PMH of HTN, Dementia, Hypothyroidism, CKD who presents after falling and being found down. Ms. Deleo has baseline dementia, so much of the history was obtained from daughter and friend who were in the room.  Subjective / 24 H Interval events Confused, without complaints  Assessment/Plan: Active Problems:   Essential hypertension, benign   TIA (transient ischemic attack)   Hypothyroidism   Dementia   CKD (chronic kidney disease)    TIA (transient ischemic attack) - Neurology following - MRI/MRA negative for stroke - LDL 136, on lipitor - A1C pending - TTE, CD ordered - Asa  daily - PT/OT/speech - NPO until swallow screen - Check CK as it is unclear how long she was down - IVF with NS 50cc/hr overnight   Essential hypertension, benign - hold HCTZ due to renal failure, blood pressure controlled this morning   Hypothyroidism - Check TSH - Continue levothyroxine   Dementia - monitor closely for sundowning or agitation - Not on any medications   CKD (chronic kidney disease) stage III-IV - at baseline, d/c HCTZ  Leukocystosis - reactive, normal this morning    Diet: Diet regular Fluids: none  DVT Prophylaxis: heparin  Code Status: DNR Family Communication: d/w daughter bedside  Disposition Plan: inpatient  Consultants:  Neurology   Procedures:  None    Antibiotics None    Studies  Dg Elbow Complete Right  10/31/2014   CLINICAL DATA:  Patient fell in kitchen landing on right side of body. Elbow bruised and swollen  EXAM: RIGHT ELBOW - COMPLETE 3+ VIEW  COMPARISON:  None.  FINDINGS: Frontal, lateral, and bilateral oblique views were obtained. There is no fracture, dislocation, or effusion. Joint spaces appear intact. There is a small spur arising from the medial distal humeral condyle. No  erosive change.  IMPRESSION: Small spur arising from the medial distal humeral condyle. No fracture or dislocation. No appreciable joint effusion.   Electronically Signed   By: Bretta Bang M.D.   On: 10/31/2014 19:47   Ct Head Wo Contrast  10/31/2014   CLINICAL DATA:  Right-sided weakness, status post fall  EXAM: CT HEAD WITHOUT CONTRAST  CT CERVICAL SPINE WITHOUT CONTRAST  TECHNIQUE: Multidetector CT imaging of the head and cervical spine was performed following the standard protocol without intravenous contrast. Multiplanar CT image reconstructions of the cervical spine were also generated.  COMPARISON:  None.  FINDINGS: CT HEAD FINDINGS  There is no evidence of mass effect, midline shift, or extra-axial fluid collections. There is no evidence of a space-occupying lesion or intracranial hemorrhage. There is no evidence of a cortical-based area of acute infarction. There is generalized cerebral atrophy. There is periventricular white matter low attenuation likely secondary to microangiopathy.  The ventricles and sulci are appropriate for the patient's age. The basal cisterns are patent.  Visualized portions of the orbits are unremarkable. The visualized portions of the paranasal sinuses and mastoid air cells are unremarkable. Cerebrovascular atherosclerotic calcifications are noted.  The osseous structures are unremarkable. There is a right frontal scalp contusion.  CT CERVICAL SPINE FINDINGS  The alignment is anatomic. The vertebral body heights are maintained. There is no acute fracture. There is no static listhesis. The prevertebral soft tissues are normal. The intraspinal soft tissues are not fully imaged on this examination due to poor soft tissue contrast, but there is no gross soft  tissue abnormality.  There is degenerative disc disease at C5-6 and C6-7. There is bilateral uncovertebral degenerative change at C6-7 with bilateral foraminal narrowing. Bilateral mild facet arthropathy and uncovertebral  degenerative changes C5-6 resulting in bilateral foraminal narrowing.  There is biapical scarring.There is bilateral carotid artery atherosclerosis.  IMPRESSION: 1. No acute intracranial pathology. 2. No acute osseous injury of the cervical spine.   Electronically Signed   By: Elige Ko   On: 10/31/2014 18:04   Ct Cervical Spine Wo Contrast  10/31/2014   CLINICAL DATA:  Right-sided weakness, status post fall  EXAM: CT HEAD WITHOUT CONTRAST  CT CERVICAL SPINE WITHOUT CONTRAST  TECHNIQUE: Multidetector CT imaging of the head and cervical spine was performed following the standard protocol without intravenous contrast. Multiplanar CT image reconstructions of the cervical spine were also generated.  COMPARISON:  None.  FINDINGS: CT HEAD FINDINGS  There is no evidence of mass effect, midline shift, or extra-axial fluid collections. There is no evidence of a space-occupying lesion or intracranial hemorrhage. There is no evidence of a cortical-based area of acute infarction. There is generalized cerebral atrophy. There is periventricular white matter low attenuation likely secondary to microangiopathy.  The ventricles and sulci are appropriate for the patient's age. The basal cisterns are patent.  Visualized portions of the orbits are unremarkable. The visualized portions of the paranasal sinuses and mastoid air cells are unremarkable. Cerebrovascular atherosclerotic calcifications are noted.  The osseous structures are unremarkable. There is a right frontal scalp contusion.  CT CERVICAL SPINE FINDINGS  The alignment is anatomic. The vertebral body heights are maintained. There is no acute fracture. There is no static listhesis. The prevertebral soft tissues are normal. The intraspinal soft tissues are not fully imaged on this examination due to poor soft tissue contrast, but there is no gross soft tissue abnormality.  There is degenerative disc disease at C5-6 and C6-7. There is bilateral uncovertebral degenerative  change at C6-7 with bilateral foraminal narrowing. Bilateral mild facet arthropathy and uncovertebral degenerative changes C5-6 resulting in bilateral foraminal narrowing.  There is biapical scarring.There is bilateral carotid artery atherosclerosis.  IMPRESSION: 1. No acute intracranial pathology. 2. No acute osseous injury of the cervical spine.   Electronically Signed   By: Elige Ko   On: 10/31/2014 18:04   Mri Brain Without Contrast  11/01/2014   CLINICAL DATA:  Found down at 3:45 p.m. yesterday, RIGHT-sided weakness. History of dementia, hypertension, hypothyroidism and chronic kidney disease.  EXAM: MRI HEAD WITHOUT CONTRAST  MRA HEAD WITHOUT CONTRAST  TECHNIQUE: Multiplanar, multiecho pulse sequences of the brain and surrounding structures were obtained without intravenous contrast. Angiographic images of the head were obtained using MRA technique without contrast.  COMPARISON:  CT of the head October 31, 2013 and MRI of the head April 21, 2012  FINDINGS: MRI HEAD FINDINGS  No reduced diffusion to suggest acute ischemia. No susceptibility artifact to suggest hemorrhage.  Moderate ventriculomegaly, likely on the basis of global parenchymal brain volume loss as there is overall commensurate enlargement of the cerebral sulci and cerebellar folia, within normal range for patient's age. No midline shift, mass effect or mass lesions. Confluent supratentorial white matter T2 hyperintensities are relatively unchanged from prior imaging. Tiny T2 hyperintensities in the basal ganglia are similar.  No abnormal extra-axial fluid collections. Status post bilateral ocular lens implants. Mild paranasal sinus mucosal thickening, small sphenoid probable mucosal retention cyst. The mastoid air cells are well aerated. No abnormal sellar expansion. No cerebellar tonsillar ectopia. No  suspicious calvarial bone marrow signal. Patient is edentulous.  MRA HEAD FINDINGS  Anterior circulation: Normal flow related enhancement of  the included cervical, petrous, cavernous and supra clinoid internal carotid arteries. Patent anterior communicating artery. Diminutive LEFT A1 segment likely on congenital basis. Normal flow related enhancement of the anterior and middle cerebral arteries, including more distal segments. Azygos ACA.  No large vessel occlusion, high-grade stenosis, aneurysm. Mild proximal luminal irregularity.  Posterior circulation: LEFT vertebral artery is dominant. Basilar artery is patent, with normal flow related enhancement of the main branch vessels. Normal flow related enhancement of the posterior cerebral arteries.  No large vessel occlusion, high-grade stenosis, aneurysm. Mild mid to distal luminal irregularity.  IMPRESSION: MRI HEAD: No acute intracranial process, specifically no evidence of acute ischemia.  Involutional changes. Moderate white matter changes can be seen with chronic small vessel ischemic disease. Tiny stable T2 hyperintensities in the basal ganglia suggest perivascular spaces and/or lacunar infarcts.  MRA HEAD: No high-grade stenosis or large vessel occlusion.  Mild luminal irregularity of the anterior and posterior circulation most consistent with atherosclerosis.   Electronically Signed   By: Awilda Metro   On: 11/01/2014 03:07   Mr Maxine Glenn Head/brain Wo Cm  11/01/2014   CLINICAL DATA:  Found down at 3:45 p.m. yesterday, RIGHT-sided weakness. History of dementia, hypertension, hypothyroidism and chronic kidney disease.  EXAM: MRI HEAD WITHOUT CONTRAST  MRA HEAD WITHOUT CONTRAST  TECHNIQUE: Multiplanar, multiecho pulse sequences of the brain and surrounding structures were obtained without intravenous contrast. Angiographic images of the head were obtained using MRA technique without contrast.  COMPARISON:  CT of the head October 31, 2013 and MRI of the head April 21, 2012  FINDINGS: MRI HEAD FINDINGS  No reduced diffusion to suggest acute ischemia. No susceptibility artifact to suggest hemorrhage.   Moderate ventriculomegaly, likely on the basis of global parenchymal brain volume loss as there is overall commensurate enlargement of the cerebral sulci and cerebellar folia, within normal range for patient's age. No midline shift, mass effect or mass lesions. Confluent supratentorial white matter T2 hyperintensities are relatively unchanged from prior imaging. Tiny T2 hyperintensities in the basal ganglia are similar.  No abnormal extra-axial fluid collections. Status post bilateral ocular lens implants. Mild paranasal sinus mucosal thickening, small sphenoid probable mucosal retention cyst. The mastoid air cells are well aerated. No abnormal sellar expansion. No cerebellar tonsillar ectopia. No suspicious calvarial bone marrow signal. Patient is edentulous.  MRA HEAD FINDINGS  Anterior circulation: Normal flow related enhancement of the included cervical, petrous, cavernous and supra clinoid internal carotid arteries. Patent anterior communicating artery. Diminutive LEFT A1 segment likely on congenital basis. Normal flow related enhancement of the anterior and middle cerebral arteries, including more distal segments. Azygos ACA.  No large vessel occlusion, high-grade stenosis, aneurysm. Mild proximal luminal irregularity.  Posterior circulation: LEFT vertebral artery is dominant. Basilar artery is patent, with normal flow related enhancement of the main branch vessels. Normal flow related enhancement of the posterior cerebral arteries.  No large vessel occlusion, high-grade stenosis, aneurysm. Mild mid to distal luminal irregularity.  IMPRESSION: MRI HEAD: No acute intracranial process, specifically no evidence of acute ischemia.  Involutional changes. Moderate white matter changes can be seen with chronic small vessel ischemic disease. Tiny stable T2 hyperintensities in the basal ganglia suggest perivascular spaces and/or lacunar infarcts.  MRA HEAD: No high-grade stenosis or large vessel occlusion.  Mild  luminal irregularity of the anterior and posterior circulation most consistent with atherosclerosis.   Electronically Signed  By: Awilda Metroourtnay  Bloomer   On: 11/01/2014 03:07    Objective  Filed Vitals:   10/31/14 2100 10/31/14 2155 11/01/14 0537 11/01/14 0948  BP: 149/73 173/72 137/52 144/62  Pulse: 93 87 77 85  Temp:  98.4 F (36.9 C) 98.8 F (37.1 C) 98.9 F (37.2 C)  TempSrc:  Oral Oral Oral  Resp: 16 16  18   Height:  5\' 2"  (1.575 m)    Weight:  78 kg (171 lb 15.3 oz)    SpO2: 96% 97% 95% 94%   No intake or output data in the 24 hours ending 11/01/14 1248 Filed Weights   10/31/14 2155  Weight: 78 kg (171 lb 15.3 oz)   Exam:  General:  NAD, confused   HEENT: no scleral icterus  Cardiovascular: RRR  Respiratory: CTA biL  Abdomen: soft, non tender  Skin: no rashes  Neuro: non focal  Data Reviewed: Basic Metabolic Panel:  Recent Labs Lab 10/31/14 1751 10/31/14 1801 11/01/14 0405  NA 142 142 138  K 3.4* 3.4* 4.1  CL 106 102 104  CO2 29  --  26  GLUCOSE 138* 135* 103*  BUN 27* 30* 21  CREATININE 2.28* 2.10* 1.79*  CALCIUM 9.4  --  9.0   Liver Function Tests:  Recent Labs Lab 10/31/14 1751  AST 22  ALT 10  ALKPHOS 71  BILITOT 0.6  PROT 6.8  ALBUMIN 3.9   CBC:  Recent Labs Lab 10/31/14 1751 10/31/14 1801 11/01/14 0405  WBC 11.7*  --  9.6  NEUTROABS 9.4*  --   --   HGB 14.9 16.3* 14.8  HCT 43.1 48.0* 43.0  MCV 90.7  --  91.3  PLT 276  --  284   Cardiac Enzymes:  Recent Labs Lab 11/01/14 0405  CKTOTAL 233*   CBG:  Recent Labs Lab 11/01/14 0640  GLUCAP 95   Scheduled Meds: . aspirin  325 mg Oral Daily  . atorvastatin  40 mg Oral q1800  . colchicine  0.3 mg Oral Daily  . heparin  5,000 Units Subcutaneous 3 times per day  . levothyroxine  100 mcg Oral QAC breakfast   Continuous Infusions:   Pamella Pertostin Zhane Bluitt, MD [facility] Pager [phone]. If 7 PM - 7 AM, please contact night-coverage at [email or web address], password  [email or web address] 11/01/2014, 12:48 PM  LOS: 1 day

## 2014-11-01 NOTE — Progress Notes (Signed)
STROKE TEAM PROGRESS NOTE   HISTORY Kristie Gonzales is an 81 y.o. female who was in her normal state of health at 11:30, then around 3:45 PM the patient called her friend and the friend was unable to hear anything. Her daughter went over to her and found her on the floor unable to speak and with right-sided weakness. The patient does not remember these events, however this is unreliable given that she is demented and does not remember events on a regular basis.  She has since returned to baseline.  Last known well: 11:30 AM TPA given: No improving symptoms   SUBJECTIVE (INTERVAL HISTORY) The patient's daughter was at the bedside. Dr. Roda ShuttersXu had a long discussion with her regarding the events that led up to the admission. The patient's daughter reports that the patient does not want to be aggressive in her workup and treatment. She is not interested in a 30 day outpatient cardiac monitor. The patient is feeling better today, at her baseline.   OBJECTIVE Temp:  [98.2 F (36.8 C)-98.9 F (37.2 C)] 98.2 F (36.8 C) (01/31 1426) Pulse Rate:  [77-106] 88 (01/31 1426) Cardiac Rhythm:  [-] Normal sinus rhythm (01/31 1200) Resp:  [16-26] 20 (01/31 1426) BP: (94-173)/(44-74) 125/44 mmHg (01/31 1426) SpO2:  [94 %-97 %] 95 % (01/31 1426) Weight:  [171 lb 15.3 oz (78 kg)] 171 lb 15.3 oz (78 kg) (01/30 2155)   Recent Labs Lab 11/01/14 0640  GLUCAP 95    Recent Labs Lab 10/31/14 1751 10/31/14 1801 11/01/14 0405  NA 142 142 138  K 3.4* 3.4* 4.1  CL 106 102 104  CO2 29  --  26  GLUCOSE 138* 135* 103*  BUN 27* 30* 21  CREATININE 2.28* 2.10* 1.79*  CALCIUM 9.4  --  9.0    Recent Labs Lab 10/31/14 1751  AST 22  ALT 10  ALKPHOS 71  BILITOT 0.6  PROT 6.8  ALBUMIN 3.9    Recent Labs Lab 10/31/14 1751 10/31/14 1801 11/01/14 0405  WBC 11.7*  --  9.6  NEUTROABS 9.4*  --   --   HGB 14.9 16.3* 14.8  HCT 43.1 48.0* 43.0  MCV 90.7  --  91.3  PLT 276  --  284    Recent  Labs Lab 11/01/14 0405  CKTOTAL 233*    Recent Labs  10/31/14 1751  LABPROT 12.9  INR 0.96    Recent Labs  10/31/14 1935  COLORURINE YELLOW  LABSPEC 1.014  PHURINE 5.0  GLUCOSEU NEGATIVE  HGBUR SMALL*  BILIRUBINUR NEGATIVE  KETONESUR NEGATIVE  PROTEINUR NEGATIVE  UROBILINOGEN 0.2  NITRITE NEGATIVE  LEUKOCYTESUR MODERATE*       Component Value Date/Time   CHOL 207* 11/01/2014 0405   TRIG 128 11/01/2014 0405   HDL 45 11/01/2014 0405   CHOLHDL 4.6 11/01/2014 0405   VLDL 26 11/01/2014 0405   LDLCALC 136* 11/01/2014 0405   No results found for: HGBA1C    Component Value Date/Time   LABOPIA NONE DETECTED 11/01/2014 0543   COCAINSCRNUR NONE DETECTED 11/01/2014 0543   LABBENZ NONE DETECTED 11/01/2014 0543   AMPHETMU NONE DETECTED 11/01/2014 0543   THCU NONE DETECTED 11/01/2014 0543   LABBARB NONE DETECTED 11/01/2014 0543     Recent Labs Lab 10/31/14 1751  ETH <5    Dg Elbow Complete Right 10/31/2014    Small spur arising from the medial distal humeral condyle. No fracture or dislocation. No appreciable joint effusion.     Ct Head Wo  Contrast 10/31/2014    1. No acute intracranial pathology.  2. No acute osseous injury of the cervical spine.     Ct Cervical Spine Wo Contrast 10/31/2014    1. No acute intracranial pathology.  2. No acute osseous injury of the cervical spine.     Mri Brain Without Contrast 11/01/2014     MRI HEAD:  No acute intracranial process, specifically no evidence of acute ischemia.  Involutional changes. Moderate white matter changes can be seen with chronic small vessel ischemic disease. Tiny stable T2 hyperintensities in the basal ganglia suggest perivascular spaces and/or lacunar infarcts.    MRA HEAD:  No high-grade stenosis or large vessel occlusion.  Mild luminal irregularity of the anterior and posterior circulation most consistent with atherosclerosis.   2D echo - pending  CUS - pending  PHYSICAL EXAM  Temp:  [98.2 F  (36.8 C)-98.9 F (37.2 C)] 98.2 F (36.8 C) (01/31 1426) Pulse Rate:  [77-106] 88 (01/31 1426) Resp:  [16-26] 20 (01/31 1426) BP: (94-173)/(44-74) 125/44 mmHg (01/31 1426) SpO2:  [94 %-97 %] 95 % (01/31 1426) Weight:  [171 lb 15.3 oz (78 kg)] 171 lb 15.3 oz (78 kg) (01/30 2155)  General - Well nourished, well developed, in no apparent distress.  Ophthalmologic - Fundi not visualized due to incorporation.  Cardiovascular - Regular rate and rhythm with no murmur.  Mental Status -  Awake alert, orientated to self, people, months, but not to year or president. Language with short sentences, paucity of speech, follow simple commands, but not complex commands, naming 2/3, repetition mildly impaired.  Cranial Nerves II - XII - II - Visual field intact OU. III, IV, VI - Extraocular movements intact bilaterally. V - Facial sensation intact bilaterally. VII - Facial movement intact bilaterally. VIII - Hearing & vestibular intact bilaterally. X - Palate elevates symmetrically. XI - Chin turning & shoulder shrug intact bilaterally. XII - Tongue protrusion intact.  Motor Strength - The patient's strength was 3+/5 RUE due to pain at elbow secondary to fall, RLE and LLE symmetrical 4/5 and LUE 5-/5. Pronator drift was present on the right due to elbow pain.  Bulk was normal and fasciculations were absent.   Motor Tone - Muscle tone was assessed at the neck and appendages and was normal.  Reflexes - The patient's reflexes were 1+ in all extremities and she had no pathological reflexes.  Sensory - Light touch, temperature/pinprick were assessed and were normal.    Coordination - The patient had normal movements in the hands with no ataxia or dysmetria.  Tremor was absent.  Gait and Station - not tested due to safety concerns.   ASSESSMENT/PLAN Kristie Gonzales is a 81 y.o. female with history of hypertension and dementia presenting with right hemiparesis and speech difficulties after  falling, now resolved. She did not receive IV t-PA due to rapidly improving deficits.   TIA:  Dominant left hemisphere, maybe embolic due to aphasia. Due to was dementia, and currently wishes, would not do further cardiac monitoring.  Resultant  resolution of deficits  MRI  No acute stroke  MRA  No large vessel occlusions  Carotid Doppler  pending  2D Echo  pending  LDL 136, not at goal  HgbA1c pending  Subcutaneous heparin for VTE prophylaxis  Diet regular with thin liquids  no antithrombotic prior to admission, now on aspirin 325 mg orally every day. Continue at discharge.  Ongoing aggressive stroke risk factor management  Therapy recommendations: Pending  Disposition:  Pending  Hypertension  Home meds:  Hydrochlorothiazide 12.5 mg daily  Stable  Hyperlipidemia  Home meds:  No lipid lowering medications prior to admission.  LDL 136, goal < 70  Lipitor 40 mg daily started  Continue statin at discharge  Dementia  Following with outside neurologist, diagnosed with Alzheimer's disease  DNR/DNI  No further workup with cardiac monitoring  Supportive care  Other Stroke Risk Factors  Advanced age   Body mass index is 31.44 kg/(m^2).   Hx stroke/TIA  Other Active Problems  Right frontal contusion  Mildly elevated creatinine - improving - 1.79 today  Other Pertinent History  History of dementia and lives alone  Gout  Hypothyroidism  Hospital day # 1  Delton See PA-C [facility] Pager [phone] 11/01/2014, 4:36 PM  I, the attending vascular neurologist, have personally obtained a history, examined the patient, evaluated laboratory data, individually viewed imaging studies and agree with radiology interpretations. I also obtained additional history from pt's daughter at bedside. I also discussed with Dr. Wyonia Hough regarding his care plan. Together with the NP/PA, we formulated the assessment and plan of care which reflects  our mutual decision.  I have made any additions or clarifications directly to the above note and agree with the findings and plan as currently documented.   81 year old female with history of the hypertension, dementia presented with transient right-sided weakness and aphasia. Symptoms resolved. MRI MRA negative. LDL 136. Concerning for cortical TIA, may be due to A. fib given at one stage. However due to advanced dementia, as well as patiently wishes, would not do aggressive measures such as prolonged outpatient cardiac monitoring. She was put on aspirin and Lipitor for stroke prevention.  Neurology will sign off. Please call with questions. Pt will follow up with Dr. Roda Shutters at [facility] in about 2 months. Thanks for the consult.  Marvel Plan, MD PhD Stroke Neurology 11/01/2014 4:46 PM     To contact Stroke Continuity provider, please refer to [email or web address]. After hours, contact General Neurology

## 2014-11-02 DIAGNOSIS — G459 Transient cerebral ischemic attack, unspecified: Secondary | ICD-10-CM

## 2014-11-02 LAB — HEMOGLOBIN A1C
Hgb A1c MFr Bld: 5.9 % — ABNORMAL HIGH (ref 4.8–5.6)
MEAN PLASMA GLUCOSE: 123 mg/dL

## 2014-11-02 NOTE — Clinical Social Work Placement (Addendum)
Clinical Social Work Department CLINICAL SOCIAL WORK PLACEMENT NOTE 11/02/2014  Patient:  Kristie Gonzales,Kristie Gonzales  Account Number:  [id number] Admit date:  10/31/2014  Clinical Social Worker:  Gwynne EdingerYSHEKA Ladrea Holladay, LCSWA  Date/time:  11/02/2014 12:26 PM  Clinical Social Work is seeking post-discharge placement for this patient at the following level of care:   SKILLED NURSING   (*CSW will update this form in Epic as items are completed)   11/02/2014  Patient/family provided with [facility] Department of Clinical Social Work's list of facilities offering this level of care within the geographic area requested by the patient (or if unable, by the patient's family).  11/02/2014  Patient/family informed of their freedom to choose among providers that offer the needed level of care, that participate in Medicare, Medicaid or managed care program needed by the patient, have an available bed and are willing to accept the patient.  11/02/2014  Patient/family informed of [facility]' ownership interest in [facility], as well as of the fact that they are under no obligation to receive care at this facility.  PASARR submitted to EDS on  PASARR number received on   FL2 transmitted to all facilities in geographic area requested by pt/family on  11/02/2014 FL2 transmitted to all facilities within larger geographic area on 11/02/2014  Patient informed that his/her managed care company has contracts with or will negotiate with  certain facilities, including the following:     Patient/family informed of bed offers received:  11/03/2014  [facility]  Physician recommends and patient chooses bed at    [facility]    Patient to be transferred to facility by [facility]  Patient and family notified of transfer on 11/03/2014  Name of family member notified:  [name]   The following physician request were entered in  Epic:   Additional Comments:  Sharlett Lienemann, MSW, LCSWA [phone]

## 2014-11-02 NOTE — Progress Notes (Signed)
Restraint order discontinued, never initiated them patients daughter arrived and was able to sit with her. Patient may need a sitter for the PM 11/02/14 will pass information on to day nurse.

## 2014-11-02 NOTE — Progress Notes (Signed)
VASCULAR LAB PRELIMINARY  PRELIMINARY  PRELIMINARY  PRELIMINARY  Carotid Dopplers completed.    Preliminary report:  1-39% ICA stenosis.  Vertebral artery flow is antegrade.   Mekisha Bittel, RVT 11/02/2014, 10:04 AM

## 2014-11-02 NOTE — Clinical Social Work Psychosocial (Signed)
Clinical Social Work Department BRIEF PSYCHOSOCIAL ASSESSMENT 11/02/2014  Patient:  Kristie Gonzales, Kristie Gonzales     Account Number:  [id number]     Admit date:  10/31/2014  Clinical Social Worker:  Marciano Sequin  Date/Time:  11/02/2014 12:18 PM  Referred by:  RN  Date Referred:  11/02/2014 Referred for  SNF Placement   Other Referral:   Interview type:  Family Other interview type:   Pt is disoriented 4x; pt's daughter Kristie Gonzales [phone]    PSYCHOSOCIAL DATA Living Status:  ALONE Admitted from facility:   Level of care:   Primary support name:  Kristie Gonzales Primary support relationship to patient:  CHILD, ADULT Degree of support available:   Strong Support System    CURRENT CONCERNS Current Concerns  None Noted   Other Concerns:    SOCIAL WORK ASSESSMENT / PLAN CSW met the pt's daughter Kristie at the bedside. CSW introduced self and purpose of the visit. CSW and pt discussed clinical recommendations for SNF rehab. CSW explained the SNF process to Kristie. CSW provided Kristie with a SNF list. CSW explained insurance component and its relation to SNF placement.  CSW answered all questions in which Kristie inquired about. CSW provided Kristie with contact information for further questions. CSW will continue to follow this pt and assist with discharge as needed.   Assessment/plan status:  Psychosocial Support/Ongoing Assessment of Needs Other assessment/ plan:   Information/referral to community resources:    PATIENT'S/FAMILY'S RESPONSE TO PLAN OF CARE: Pt's daughter was receptive during the assessment. Pt's daughter reported understanding of the SNF process because the pt received rehab in the past from [facility]. Pt's daughter reported she would like the pt to transition to [facility], but understand that [facility] may not be an option due to the pt's change in insurance.    [place], MSW, [place]

## 2014-11-02 NOTE — Progress Notes (Signed)
Patient has gone in and out of confused state has had episodes of non-compliance trying to get out of bed 4 times but then will revert back to normal state but has had expressive aphasia all night adding to her frustration. Obtained order for pos-belt non-violent restraint due sitter unavailability. Have not used the restraints at this time but will monitor patient closely.

## 2014-11-02 NOTE — Progress Notes (Signed)
Echocardiogram 2D Echocardiogram has been performed.  Dorothey BasemanReel, Marvette Schamp M 11/02/2014, 8:24 AM

## 2014-11-02 NOTE — Progress Notes (Signed)
UR completed 

## 2014-11-02 NOTE — Evaluation (Signed)
Occupational Therapy Evaluation Patient Details Name: Kristie Gonzales MRN: [id number] DOB: 07/02/1926 Today's Date: 11/02/2014    History of Present Illness This 81 y.o. female admitted after being found on the floor unable to speak with Rt sided weakness. MRI showed no acute intracranial process.  PMH includes: dementia; HTN; Gout   Clinical Impression   Pt admitted with above. She presents to OT with generalized weakness, impaired balance, and cognitive impairment.  She currently, requires min  A for BADLs and previously lived alone with significant support from family and friends.  At this time recommend SNF level rehab to allow her to regain independence with BADLs,  then transition to ALF.  All further OT needs can be addressed at SNF.  Acute OT will sign off at this time.     Follow Up Recommendations  SNF    Equipment Recommendations  None recommended by OT    Recommendations for Other Services       Precautions / Restrictions Precautions Precautions: Fall      Mobility Bed Mobility                  Transfers Overall transfer level: Needs assistance   Transfers: Sit to/from Stand;Stand Pivot Transfers Sit to Stand: Min assist Stand pivot transfers: Min guard       General transfer comment: min A to power up from seated position     Balance Overall balance assessment: Needs assistance Sitting-balance support: Feet supported Sitting balance-Leahy Scale: Good     Standing balance support: During functional activity;Single extremity supported Standing balance-Leahy Scale: Poor                              ADL Overall ADL's : Needs assistance/impaired Eating/Feeding: Set up;Sitting   Grooming: Wash/dry hands;Wash/dry face;Oral care;Brushing hair;Min guard;Standing   Upper Body Bathing: Supervision/ safety;Sitting   Lower Body Bathing: Minimal assistance;Sit to/from stand   Upper Body Dressing : Minimal assistance;Sitting   Lower Body  Dressing: Minimal assistance;Sit to/from stand   Toilet Transfer: Minimal assistance;Ambulation;Comfort height toilet;Grab bars;RW StatisticianToilet Transfer Details (indicate cue type and reason): assist to move sit to stand  Toileting- ArchitectClothing Manipulation and Hygiene: Min guard;Sit to/from stand       Functional mobility during ADLs: Min guard;Rolling walker General ADL Comments: Pt moves slowly requiring increased time to complete activities      Vision                     Perception     Praxis      Pertinent Vitals/Pain Pain Assessment: Faces Faces Pain Scale: No hurt     Hand Dominance Right   Extremity/Trunk Assessment Upper Extremity Assessment Upper Extremity Assessment: Generalized weakness   Lower Extremity Assessment Lower Extremity Assessment: Defer to PT evaluation       Communication Communication Communication: HOH;Expressive difficulties   Cognition Arousal/Alertness: Awake/alert Behavior During Therapy: WFL for tasks assessed/performed Overall Cognitive Status: History of cognitive impairments - at baseline                     General Comments       Exercises       Shoulder Instructions      Home Living Family/patient expects to be discharged to:: Skilled nursing facility  Prior Functioning/Environment Level of Independence: Needs assistance  Gait / Transfers Assistance Needed: Daughter states standing up has become more difficult;  ADL's / Homemaking Assistance Needed: Pt able to perform BADLs mod I.  Daughter and friends assisted with IADLs   Comments: Daughter and close friend assist with grocery shopping, IADLs; Daughter describes a recent decline that neighbors have noted over the past few months    OT Diagnosis: Generalized weakness;Cognitive deficits   OT Problem List: Decreased strength;Decreased activity tolerance;Impaired balance (sitting and/or standing);Decreased  cognition;Decreased safety awareness;Decreased knowledge of use of DME or AE   OT Treatment/Interventions:      OT Goals(Current goals can be found in the care plan section) Acute Rehab OT Goals Patient Stated Goal: To regain as much independence as possible  OT Goal Formulation: All assessment and education complete, DC therapy  OT Frequency:     Barriers to D/C:            Co-evaluation              End of Session Equipment Utilized During Treatment: Rolling walker Nurse Communication: Mobility status  Activity Tolerance: Patient tolerated treatment well Patient left: in chair;with call bell/phone within reach;with chair alarm set;with family/visitor present   Time: 1610-9604 OT Time Calculation (min): 25 min Charges:  OT General Charges $OT Visit: 1 Procedure OT Evaluation $Initial OT Evaluation Tier I: 1 Procedure OT Treatments $Self Care/Home Management : 8-22 mins G-Codes: OT G-codes **NOT FOR INPATIENT CLASS** Functional Limitation: Self care Self Care Current Status (V4098): At least 20 percent but less than 40 percent impaired, limited or restricted Self Care Goal Status (J1914): At least 20 percent but less than 40 percent impaired, limited or restricted Self Care Discharge Status [phone]): At least 20 percent but less than 40 percent impaired, limited or restricted  Gabbi Whetstone M 11/02/2014, 1:57 PM

## 2014-11-02 NOTE — Progress Notes (Signed)
PROGRESS NOTE  Kristie Gonzales ZOX:[id number] DOB: 14-Sep-1926 DOA: 10/31/2014 PCP: Enrique Sack, MD  HPI: Kristie Gonzales is an 81yo woman with PMH of HTN, Dementia, Hypothyroidism, CKD who presents after falling and being found down. Kristie Gonzales has baseline dementia, so much of the history was obtained from daughter and friend who were in the room.  Subjective / 24 H Interval events Confused, without complaints, eating breakfast assisted by her daughter   Assessment/Plan: Active Problems:   Essential hypertension, benign   TIA (transient ischemic attack)   Hypothyroidism   Dementia   CKD (chronic kidney disease)    TIA (transient ischemic attack) - Neurology following - MRI/MRA negative for stroke - LDL 136, on lipitor - A1C pending - TTE pending - CD with 1-39% stenosis - Asa 325mg  daily - PT/OT/speech  Essential hypertension, benign - hold HCTZ due to renal failure, blood pressure controlled this morning   Hypothyroidism - TSH normal - Continue levothyroxine   Dementia - monitor closely for sundowning or agitation - Not on any medications   CKD (chronic kidney disease) stage III-IV - at baseline, d/c HCTZ  Leukocystosis - reactive, normalized   Diet: Diet regular Fluids: none  DVT Prophylaxis: heparin  Code Status: DNR Family Communication: d/w daughter bedside  Disposition Plan: inpatient  Consultants:  Neurology   Procedures:  None    Antibiotics None    Studies  Dg Elbow Complete Right  10/31/2014   CLINICAL DATA:  Patient fell in kitchen landing on right side of body. Elbow bruised and swollen  EXAM: RIGHT ELBOW - COMPLETE 3+ VIEW  COMPARISON:  None.  FINDINGS: Frontal, lateral, and bilateral oblique views were obtained. There is no fracture, dislocation, or effusion. Joint spaces appear intact. There is a small spur arising from the medial distal humeral condyle. No erosive change.  IMPRESSION: Small spur arising from the  medial distal humeral condyle. No fracture or dislocation. No appreciable joint effusion.   Electronically Signed   By: Bretta Bang M.D.   On: 10/31/2014 19:47   Ct Head Wo Contrast  10/31/2014   CLINICAL DATA:  Right-sided weakness, status post fall  EXAM: CT HEAD WITHOUT CONTRAST  CT CERVICAL SPINE WITHOUT CONTRAST  TECHNIQUE: Multidetector CT imaging of the head and cervical spine was performed following the standard protocol without intravenous contrast. Multiplanar CT image reconstructions of the cervical spine were also generated.  COMPARISON:  None.  FINDINGS: CT HEAD FINDINGS  There is no evidence of mass effect, midline shift, or extra-axial fluid collections. There is no evidence of a space-occupying lesion or intracranial hemorrhage. There is no evidence of a cortical-based area of acute infarction. There is generalized cerebral atrophy. There is periventricular white matter low attenuation likely secondary to microangiopathy.  The ventricles and sulci are appropriate for the patient's age. The basal cisterns are patent.  Visualized portions of the orbits are unremarkable. The visualized portions of the paranasal sinuses and mastoid air cells are unremarkable. Cerebrovascular atherosclerotic calcifications are noted.  The osseous structures are unremarkable. There is a right frontal scalp contusion.  CT CERVICAL SPINE FINDINGS  The alignment is anatomic. The vertebral body heights are maintained. There is no acute fracture. There is no static listhesis. The prevertebral soft tissues are normal. The intraspinal soft tissues are not fully imaged on this examination due to poor soft tissue contrast, but there is no gross soft tissue abnormality.  There is degenerative disc disease at C5-6 and C6-7. There is bilateral uncovertebral  degenerative change at C6-7 with bilateral foraminal narrowing. Bilateral mild facet arthropathy and uncovertebral degenerative changes C5-6 resulting in bilateral  foraminal narrowing.  There is biapical scarring.There is bilateral carotid artery atherosclerosis.  IMPRESSION: 1. No acute intracranial pathology. 2. No acute osseous injury of the cervical spine.   Electronically Signed   By: Elige Ko   On: 10/31/2014 18:04   Ct Cervical Spine Wo Contrast  10/31/2014   CLINICAL DATA:  Right-sided weakness, status post fall  EXAM: CT HEAD WITHOUT CONTRAST  CT CERVICAL SPINE WITHOUT CONTRAST  TECHNIQUE: Multidetector CT imaging of the head and cervical spine was performed following the standard protocol without intravenous contrast. Multiplanar CT image reconstructions of the cervical spine were also generated.  COMPARISON:  None.  FINDINGS: CT HEAD FINDINGS  There is no evidence of mass effect, midline shift, or extra-axial fluid collections. There is no evidence of a space-occupying lesion or intracranial hemorrhage. There is no evidence of a cortical-based area of acute infarction. There is generalized cerebral atrophy. There is periventricular white matter low attenuation likely secondary to microangiopathy.  The ventricles and sulci are appropriate for the patient's age. The basal cisterns are patent.  Visualized portions of the orbits are unremarkable. The visualized portions of the paranasal sinuses and mastoid air cells are unremarkable. Cerebrovascular atherosclerotic calcifications are noted.  The osseous structures are unremarkable. There is a right frontal scalp contusion.  CT CERVICAL SPINE FINDINGS  The alignment is anatomic. The vertebral body heights are maintained. There is no acute fracture. There is no static listhesis. The prevertebral soft tissues are normal. The intraspinal soft tissues are not fully imaged on this examination due to poor soft tissue contrast, but there is no gross soft tissue abnormality.  There is degenerative disc disease at C5-6 and C6-7. There is bilateral uncovertebral degenerative change at C6-7 with bilateral foraminal  narrowing. Bilateral mild facet arthropathy and uncovertebral degenerative changes C5-6 resulting in bilateral foraminal narrowing.  There is biapical scarring.There is bilateral carotid artery atherosclerosis.  IMPRESSION: 1. No acute intracranial pathology. 2. No acute osseous injury of the cervical spine.   Electronically Signed   By: Elige Ko   On: 10/31/2014 18:04   Mri Brain Without Contrast  11/01/2014   CLINICAL DATA:  Found down at 3:45 p.m. yesterday, RIGHT-sided weakness. History of dementia, hypertension, hypothyroidism and chronic kidney disease.  EXAM: MRI HEAD WITHOUT CONTRAST  MRA HEAD WITHOUT CONTRAST  TECHNIQUE: Multiplanar, multiecho pulse sequences of the brain and surrounding structures were obtained without intravenous contrast. Angiographic images of the head were obtained using MRA technique without contrast.  COMPARISON:  CT of the head October 31, 2013 and MRI of the head April 21, 2012  FINDINGS: MRI HEAD FINDINGS  No reduced diffusion to suggest acute ischemia. No susceptibility artifact to suggest hemorrhage.  Moderate ventriculomegaly, likely on the basis of global parenchymal brain volume loss as there is overall commensurate enlargement of the cerebral sulci and cerebellar folia, within normal range for patient's age. No midline shift, mass effect or mass lesions. Confluent supratentorial white matter T2 hyperintensities are relatively unchanged from prior imaging. Tiny T2 hyperintensities in the basal ganglia are similar.  No abnormal extra-axial fluid collections. Status post bilateral ocular lens implants. Mild paranasal sinus mucosal thickening, small sphenoid probable mucosal retention cyst. The mastoid air cells are well aerated. No abnormal sellar expansion. No cerebellar tonsillar ectopia. No suspicious calvarial bone marrow signal. Patient is edentulous.  MRA HEAD FINDINGS  Anterior circulation: Normal  flow related enhancement of the included cervical, petrous,  cavernous and supra clinoid internal carotid arteries. Patent anterior communicating artery. Diminutive LEFT A1 segment likely on congenital basis. Normal flow related enhancement of the anterior and middle cerebral arteries, including more distal segments. Azygos ACA.  No large vessel occlusion, high-grade stenosis, aneurysm. Mild proximal luminal irregularity.  Posterior circulation: LEFT vertebral artery is dominant. Basilar artery is patent, with normal flow related enhancement of the main branch vessels. Normal flow related enhancement of the posterior cerebral arteries.  No large vessel occlusion, high-grade stenosis, aneurysm. Mild mid to distal luminal irregularity.  IMPRESSION: MRI HEAD: No acute intracranial process, specifically no evidence of acute ischemia.  Involutional changes. Moderate white matter changes can be seen with chronic small vessel ischemic disease. Tiny stable T2 hyperintensities in the basal ganglia suggest perivascular spaces and/or lacunar infarcts.  MRA HEAD: No high-grade stenosis or large vessel occlusion.  Mild luminal irregularity of the anterior and posterior circulation most consistent with atherosclerosis.   Electronically Signed   By: Awilda Metro   On: 11/01/2014 03:07   Mr Maxine Glenn Head/brain Wo Cm  11/01/2014   CLINICAL DATA:  Found down at 3:45 p.m. yesterday, RIGHT-sided weakness. History of dementia, hypertension, hypothyroidism and chronic kidney disease.  EXAM: MRI HEAD WITHOUT CONTRAST  MRA HEAD WITHOUT CONTRAST  TECHNIQUE: Multiplanar, multiecho pulse sequences of the brain and surrounding structures were obtained without intravenous contrast. Angiographic images of the head were obtained using MRA technique without contrast.  COMPARISON:  CT of the head October 31, 2013 and MRI of the head April 21, 2012  FINDINGS: MRI HEAD FINDINGS  No reduced diffusion to suggest acute ischemia. No susceptibility artifact to suggest hemorrhage.  Moderate ventriculomegaly,  likely on the basis of global parenchymal brain volume loss as there is overall commensurate enlargement of the cerebral sulci and cerebellar folia, within normal range for patient's age. No midline shift, mass effect or mass lesions. Confluent supratentorial white matter T2 hyperintensities are relatively unchanged from prior imaging. Tiny T2 hyperintensities in the basal ganglia are similar.  No abnormal extra-axial fluid collections. Status post bilateral ocular lens implants. Mild paranasal sinus mucosal thickening, small sphenoid probable mucosal retention cyst. The mastoid air cells are well aerated. No abnormal sellar expansion. No cerebellar tonsillar ectopia. No suspicious calvarial bone marrow signal. Patient is edentulous.  MRA HEAD FINDINGS  Anterior circulation: Normal flow related enhancement of the included cervical, petrous, cavernous and supra clinoid internal carotid arteries. Patent anterior communicating artery. Diminutive LEFT A1 segment likely on congenital basis. Normal flow related enhancement of the anterior and middle cerebral arteries, including more distal segments. Azygos ACA.  No large vessel occlusion, high-grade stenosis, aneurysm. Mild proximal luminal irregularity.  Posterior circulation: LEFT vertebral artery is dominant. Basilar artery is patent, with normal flow related enhancement of the main branch vessels. Normal flow related enhancement of the posterior cerebral arteries.  No large vessel occlusion, high-grade stenosis, aneurysm. Mild mid to distal luminal irregularity.  IMPRESSION: MRI HEAD: No acute intracranial process, specifically no evidence of acute ischemia.  Involutional changes. Moderate white matter changes can be seen with chronic small vessel ischemic disease. Tiny stable T2 hyperintensities in the basal ganglia suggest perivascular spaces and/or lacunar infarcts.  MRA HEAD: No high-grade stenosis or large vessel occlusion.  Mild luminal irregularity of the  anterior and posterior circulation most consistent with atherosclerosis.   Electronically Signed   By: Awilda Metro   On: 11/01/2014 03:07    Objective  Filed Vitals:   11/02/14 0200 11/02/14 0500 11/02/14 1014 11/02/14 1424  BP: 151/60 157/68 153/53 125/55  Pulse: 86 81 63 84  Temp: 98.1 F (36.7 C) 98.2 F (36.8 C) 98.5 F (36.9 C) 98.3 F (36.8 C)  TempSrc: Oral Oral Oral Oral  Resp: 18 16 20 20   Height:      Weight:      SpO2: 96% 96% 96% 97%   No intake or output data in the 24 hours ending 11/02/14 1443 Filed Weights   10/31/14 2155  Weight: 78 kg (171 lb 15.3 oz)   Exam:  General:  NAD, confused   HEENT: no scleral icterus  Cardiovascular: RRR  Respiratory: CTA biL  Abdomen: soft, non tender  Skin: no rashes  Neuro: non focal  Data Reviewed: Basic Metabolic Panel:  Recent Labs Lab 10/31/14 1751 10/31/14 1801 11/01/14 0405  NA 142 142 138  K 3.4* 3.4* 4.1  CL 106 102 104  CO2 29  --  26  GLUCOSE 138* 135* 103*  BUN 27* 30* 21  CREATININE 2.28* 2.10* 1.79*  CALCIUM 9.4  --  9.0   Liver Function Tests:  Recent Labs Lab 10/31/14 1751  AST 22  ALT 10  ALKPHOS 71  BILITOT 0.6  PROT 6.8  ALBUMIN 3.9   CBC:  Recent Labs Lab 10/31/14 1751 10/31/14 1801 11/01/14 0405  WBC 11.7*  --  9.6  NEUTROABS 9.4*  --   --   HGB 14.9 16.3* 14.8  HCT 43.1 48.0* 43.0  MCV 90.7  --  91.3  PLT 276  --  284   Cardiac Enzymes:  Recent Labs Lab 11/01/14 0405  CKTOTAL 233*   CBG:  Recent Labs Lab 11/01/14 0640  GLUCAP 95   Scheduled Meds: . aspirin  325 mg Oral Daily  . atorvastatin  40 mg Oral q1800  . colchicine  0.3 mg Oral Daily  . heparin  5,000 Units Subcutaneous 3 times per day  . levothyroxine  100 mcg Oral QAC breakfast   Continuous Infusions:   Kristie Pertostin Karolynn Infantino, MD [facility] Pager [phone]. If 7 PM - 7 AM, please contact night-coverage at [email or web address], password [email or web address] 11/02/2014, 2:43 PM  LOS: 2 days

## 2014-11-03 MED ORDER — ATORVASTATIN CALCIUM 40 MG PO TABS: 40.0000 mg | ORAL_TABLET | Freq: Every day | ORAL | Status: AC

## 2014-11-03 MED ORDER — ASPIRIN 325 MG PO TABS
325.0000 mg | ORAL_TABLET | Freq: Every day | ORAL | Status: AC
Start: 2014-11-03 — End: ?

## 2014-11-03 NOTE — Progress Notes (Signed)
Patient will be discharged to [facility]. Report called and given to [place]. IV removed, tele removed, patient complains of no pain, VSS.

## 2014-11-03 NOTE — Discharge Summary (Signed)
Physician Discharge Summary  Kristie Gonzales ZOX:[id number] DOB: 01-Mar-1926 DOA: 10/31/2014  PCP: Enrique Sack, Gonzales  Admit date: 10/31/2014 Discharge date: 11/03/2014  Time spent: 45 minutes  Recommendations for Outpatient Follow-up:  1. Follow up with Kristie Gonzales in 2 weeks 2. Follow up with Kristie Gonzales in 2 months 3. Discharge to SNF  Discharge Diagnoses:  Active Problems:   Essential hypertension, benign   TIA (transient ischemic attack)   Hypothyroidism   Dementia   CKD (chronic kidney disease)  Discharge Condition: stable   Diet recommendation: heart healthy  Filed Weights   10/31/14 2155  Weight: 78 kg (171 lb 15.3 oz)    History of present illness:  Kristie Gonzales is an 81yo woman with PMH of HTN, Dementia, Hypothyroidism, CKD who presents after falling and being found down. Kristie Gonzales has baseline dementia, so much of the history was obtained from daughter and friend who were in the room. Kristie Gonzales daughter was with her until 12:20pm today and she was normal at that time. Around 345pm the patient called her friend for help and when she went to check on her, she was down and could not get up. Her daughter returned and they picked Kristie Gonzales up and noted that she was weaker on the right, particularly the right leg. Since that time she has also been noted to not be using her right arm much and to be having excessive blinking/closing of the right eye. There was concern for pronator drift. Neurology has seen the patient and is following.   Hospital Course:  TIA (transient ischemic attack) - neurology was consulted and have followed patient while hospitalized. She underwent an MRI/MRA which was negative for stroke. Her lipid panel showed an LDL of 136, patient on Lipitor. Per neurology recommendations, she will be on Aspirin 325 mg daily. She underwent a 2D echo which showed EF 75-70% and grade 2 diastolic dysfunction. Carotid dopplers without significant stenosis. Kristie Gonzales  from neurology discussed with patient's family, her TIA might be due to underlying paroxysmal A fib, however due to advanced dementia, as well as patient wishes, would not do aggressive measures such as prolonged outpatient cardiac monitoring.  Essential hypertension, benign - hold HCTZ due to renal failure, blood pressure controlled Hypothyroidism- TSH normal, Continue levothyroxine Dementia - stable CKD (chronic kidney disease) stage III-IV- at baseline, d/c HCTZ Leukocystosis - reactive, normalized  Procedures:  2D echo  Carotid doppler   Consultations:  Neurology   Discharge Exam: Filed Vitals:   11/03/14 1011 11/03/14 1100 11/03/14 1256 11/03/14 1307  BP: 111/74 125/49 132/72 131/51  Pulse: 89 88 80 81  Temp: 98.1 F (36.7 C) 98.5 F (36.9 C) 98.7 F (37.1 C) 97.4 F (36.3 C)  TempSrc: Oral Oral Oral Oral  Resp: 20 20 20 20   Height:      Weight:      SpO2: 96% 97% 100% 100%   General: NAD Cardiovascular: RRR Respiratory: CTA biL  Discharge Instructions  Discharge Instructions    Ambulatory referral to Neurology    Complete by:  As directed   Pt will follow up with Kristie Gonzales at [facility] in about 2 months. Thanks.            Medication List    STOP taking these medications        colchicine 0.6 MG tablet     hydrochlorothiazide 25 MG tablet  Commonly known as:  HYDRODIURIL      TAKE these medications  aspirin 325 MG tablet  Take 1 tablet (325 mg total) by mouth daily.     atorvastatin 40 MG tablet  Commonly known as:  LIPITOR  Take 1 tablet (40 mg total) by mouth daily at 6 PM.     levothyroxine 100 MCG tablet  Commonly known as:  SYNTHROID, LEVOTHROID  Take 100 mcg by mouth daily.     loperamide 2 MG capsule  Commonly known as:  IMODIUM  Take 2 mg by mouth 4 (four) times daily as needed for diarrhea or loose stools.     mupirocin ointment 2 %  Commonly known as:  BACTROBAN  Apply topically 3 (three) times daily.     predniSONE 20 MG  tablet  Commonly known as:  DELTASONE  Take 2 daily for 3 days for inflammation in foot           Follow-up Information    Follow up with Kristie Gonzales. Schedule an appointment as soon as possible for a visit in 2 weeks.   Specialty:  Internal Medicine   Contact information:   [address] [place] [address] [phone]       Follow up with Kristie Gonzales. Schedule an appointment as soon as possible for a visit in 1 month.   Specialty:  Neurology   Contact information:   [address] [place] [address] [phone]       The results of significant diagnostics from this hospitalization (including imaging, microbiology, ancillary and laboratory) are listed below for reference.    Significant Diagnostic Studies: Dg Elbow Complete Right  10/31/2014   CLINICAL DATA:  Patient fell in kitchen landing on right side of body. Elbow bruised and swollen  EXAM: RIGHT ELBOW - COMPLETE 3+ VIEW  COMPARISON:  None.  FINDINGS: Frontal, lateral, and bilateral oblique views were obtained. There is no fracture, dislocation, or effusion. Joint spaces appear intact. There is a small spur arising from the medial distal humeral condyle. No erosive change.  IMPRESSION: Small spur arising from the medial distal humeral condyle. No fracture or dislocation. No appreciable joint effusion.   Electronically Signed   By: Bretta Bang M.D.   On: 10/31/2014 19:47   Ct Head Wo Contrast  10/31/2014   CLINICAL DATA:  Right-sided weakness, status post fall  EXAM: CT HEAD WITHOUT CONTRAST  CT CERVICAL SPINE WITHOUT CONTRAST  TECHNIQUE: Multidetector CT imaging of the head and cervical spine was performed following the standard protocol without intravenous contrast. Multiplanar CT image reconstructions of the cervical spine were also generated.  COMPARISON:  None.  FINDINGS: CT HEAD FINDINGS  There is no evidence of mass effect, midline shift, or extra-axial fluid  collections. There is no evidence of a space-occupying lesion or intracranial hemorrhage. There is no evidence of a cortical-based area of acute infarction. There is generalized cerebral atrophy. There is periventricular white matter low attenuation likely secondary to microangiopathy.  The ventricles and sulci are appropriate for the patient's age. The basal cisterns are patent.  Visualized portions of the orbits are unremarkable. The visualized portions of the paranasal sinuses and mastoid air cells are unremarkable. Cerebrovascular atherosclerotic calcifications are noted.  The osseous structures are unremarkable. There is a right frontal scalp contusion.  CT CERVICAL SPINE FINDINGS  The alignment is anatomic. The vertebral body heights are maintained. There is no acute fracture. There is no static listhesis. The prevertebral soft tissues are normal. The intraspinal soft tissues are not fully imaged on  this examination due to poor soft tissue contrast, but there is no gross soft tissue abnormality.  There is degenerative disc disease at C5-6 and C6-7. There is bilateral uncovertebral degenerative change at C6-7 with bilateral foraminal narrowing. Bilateral mild facet arthropathy and uncovertebral degenerative changes C5-6 resulting in bilateral foraminal narrowing.  There is biapical scarring.There is bilateral carotid artery atherosclerosis.  IMPRESSION: 1. No acute intracranial pathology. 2. No acute osseous injury of the cervical spine.   Electronically Signed   By: Elige KoHetal  Patel   On: 10/31/2014 18:04   Ct Cervical Spine Wo Contrast  10/31/2014   CLINICAL DATA:  Right-sided weakness, status post fall  EXAM: CT HEAD WITHOUT CONTRAST  CT CERVICAL SPINE WITHOUT CONTRAST  TECHNIQUE: Multidetector CT imaging of the head and cervical spine was performed following the standard protocol without intravenous contrast. Multiplanar CT image reconstructions of the cervical spine were also generated.  COMPARISON:  None.   FINDINGS: CT HEAD FINDINGS  There is no evidence of mass effect, midline shift, or extra-axial fluid collections. There is no evidence of a space-occupying lesion or intracranial hemorrhage. There is no evidence of a cortical-based area of acute infarction. There is generalized cerebral atrophy. There is periventricular white matter low attenuation likely secondary to microangiopathy.  The ventricles and sulci are appropriate for the patient's age. The basal cisterns are patent.  Visualized portions of the orbits are unremarkable. The visualized portions of the paranasal sinuses and mastoid air cells are unremarkable. Cerebrovascular atherosclerotic calcifications are noted.  The osseous structures are unremarkable. There is a right frontal scalp contusion.  CT CERVICAL SPINE FINDINGS  The alignment is anatomic. The vertebral body heights are maintained. There is no acute fracture. There is no static listhesis. The prevertebral soft tissues are normal. The intraspinal soft tissues are not fully imaged on this examination due to poor soft tissue contrast, but there is no gross soft tissue abnormality.  There is degenerative disc disease at C5-6 and C6-7. There is bilateral uncovertebral degenerative change at C6-7 with bilateral foraminal narrowing. Bilateral mild facet arthropathy and uncovertebral degenerative changes C5-6 resulting in bilateral foraminal narrowing.  There is biapical scarring.There is bilateral carotid artery atherosclerosis.  IMPRESSION: 1. No acute intracranial pathology. 2. No acute osseous injury of the cervical spine.   Electronically Signed   By: Elige KoHetal  Patel   On: 10/31/2014 18:04   Mri Brain Without Contrast  11/01/2014   CLINICAL DATA:  Found down at 3:45 p.m. yesterday, RIGHT-sided weakness. History of dementia, hypertension, hypothyroidism and chronic kidney disease.  EXAM: MRI HEAD WITHOUT CONTRAST  MRA HEAD WITHOUT CONTRAST  TECHNIQUE: Multiplanar, multiecho pulse sequences of the  brain and surrounding structures were obtained without intravenous contrast. Angiographic images of the head were obtained using MRA technique without contrast.  COMPARISON:  CT of the head October 31, 2013 and MRI of the head April 21, 2012  FINDINGS: MRI HEAD FINDINGS  No reduced diffusion to suggest acute ischemia. No susceptibility artifact to suggest hemorrhage.  Moderate ventriculomegaly, likely on the basis of global parenchymal brain volume loss as there is overall commensurate enlargement of the cerebral sulci and cerebellar folia, within normal range for patient's age. No midline shift, mass effect or mass lesions. Confluent supratentorial white matter T2 hyperintensities are relatively unchanged from prior imaging. Tiny T2 hyperintensities in the basal ganglia are similar.  No abnormal extra-axial fluid collections. Status post bilateral ocular lens implants. Mild paranasal sinus mucosal thickening, small sphenoid probable mucosal retention cyst. The mastoid  air cells are well aerated. No abnormal sellar expansion. No cerebellar tonsillar ectopia. No suspicious calvarial bone marrow signal. Patient is edentulous.  MRA HEAD FINDINGS  Anterior circulation: Normal flow related enhancement of the included cervical, petrous, cavernous and supra clinoid internal carotid arteries. Patent anterior communicating artery. Diminutive LEFT A1 segment likely on congenital basis. Normal flow related enhancement of the anterior and middle cerebral arteries, including more distal segments. Azygos ACA.  No large vessel occlusion, high-grade stenosis, aneurysm. Mild proximal luminal irregularity.  Posterior circulation: LEFT vertebral artery is dominant. Basilar artery is patent, with normal flow related enhancement of the main branch vessels. Normal flow related enhancement of the posterior cerebral arteries.  No large vessel occlusion, high-grade stenosis, aneurysm. Mild mid to distal luminal irregularity.  IMPRESSION: MRI  HEAD: No acute intracranial process, specifically no evidence of acute ischemia.  Involutional changes. Moderate white matter changes can be seen with chronic small vessel ischemic disease. Tiny stable T2 hyperintensities in the basal ganglia suggest perivascular spaces and/or lacunar infarcts.  MRA HEAD: No high-grade stenosis or large vessel occlusion.  Mild luminal irregularity of the anterior and posterior circulation most consistent with atherosclerosis.   Electronically Signed   By: Awilda Metro   On: 11/01/2014 03:07   Mr Maxine Glenn Head/brain Wo Cm  11/01/2014   CLINICAL DATA:  Found down at 3:45 p.m. yesterday, RIGHT-sided weakness. History of dementia, hypertension, hypothyroidism and chronic kidney disease.  EXAM: MRI HEAD WITHOUT CONTRAST  MRA HEAD WITHOUT CONTRAST  TECHNIQUE: Multiplanar, multiecho pulse sequences of the brain and surrounding structures were obtained without intravenous contrast. Angiographic images of the head were obtained using MRA technique without contrast.  COMPARISON:  CT of the head October 31, 2013 and MRI of the head April 21, 2012  FINDINGS: MRI HEAD FINDINGS  No reduced diffusion to suggest acute ischemia. No susceptibility artifact to suggest hemorrhage.  Moderate ventriculomegaly, likely on the basis of global parenchymal brain volume loss as there is overall commensurate enlargement of the cerebral sulci and cerebellar folia, within normal range for patient's age. No midline shift, mass effect or mass lesions. Confluent supratentorial white matter T2 hyperintensities are relatively unchanged from prior imaging. Tiny T2 hyperintensities in the basal ganglia are similar.  No abnormal extra-axial fluid collections. Status post bilateral ocular lens implants. Mild paranasal sinus mucosal thickening, small sphenoid probable mucosal retention cyst. The mastoid air cells are well aerated. No abnormal sellar expansion. No cerebellar tonsillar ectopia. No suspicious calvarial bone  marrow signal. Patient is edentulous.  MRA HEAD FINDINGS  Anterior circulation: Normal flow related enhancement of the included cervical, petrous, cavernous and supra clinoid internal carotid arteries. Patent anterior communicating artery. Diminutive LEFT A1 segment likely on congenital basis. Normal flow related enhancement of the anterior and middle cerebral arteries, including more distal segments. Azygos ACA.  No large vessel occlusion, high-grade stenosis, aneurysm. Mild proximal luminal irregularity.  Posterior circulation: LEFT vertebral artery is dominant. Basilar artery is patent, with normal flow related enhancement of the main branch vessels. Normal flow related enhancement of the posterior cerebral arteries.  No large vessel occlusion, high-grade stenosis, aneurysm. Mild mid to distal luminal irregularity.  IMPRESSION: MRI HEAD: No acute intracranial process, specifically no evidence of acute ischemia.  Involutional changes. Moderate white matter changes can be seen with chronic small vessel ischemic disease. Tiny stable T2 hyperintensities in the basal ganglia suggest perivascular spaces and/or lacunar infarcts.  MRA HEAD: No high-grade stenosis or large vessel occlusion.  Mild luminal irregularity of  the anterior and posterior circulation most consistent with atherosclerosis.   Electronically Signed   By: Awilda Metro   On: 11/01/2014 03:07   Labs: Basic Metabolic Panel:  Recent Labs Lab 10/31/14 1751 10/31/14 1801 11/01/14 0405  NA 142 142 138  K 3.4* 3.4* 4.1  CL 106 102 104  CO2 29  --  26  GLUCOSE 138* 135* 103*  BUN 27* 30* 21  CREATININE 2.28* 2.10* 1.79*  CALCIUM 9.4  --  9.0   Liver Function Tests:  Recent Labs Lab 10/31/14 1751  AST 22  ALT 10  ALKPHOS 71  BILITOT 0.6  PROT 6.8  ALBUMIN 3.9   CBC:  Recent Labs Lab 10/31/14 1751 10/31/14 1801 11/01/14 0405  WBC 11.7*  --  9.6  NEUTROABS 9.4*  --   --   HGB 14.9 16.3* 14.8  HCT 43.1 48.0* 43.0  MCV  90.7  --  91.3  PLT 276  --  284   Cardiac Enzymes:  Recent Labs Lab 11/01/14 0405  CKTOTAL 233*    CBG:  Recent Labs Lab 11/01/14 0640  GLUCAP 95    Signed:  Pamella Pert  [facility] 11/03/2014, 1:57 PM

## 2014-11-03 NOTE — Progress Notes (Addendum)
Physical Therapy Treatment Patient Details Name: Kristie Gonzales MRN: [id number] DOB: 02/18/1926 Today's Date: 11/03/2014    History of Present Illness This 81 y.o. female admitted after being found on the floor unable to speak with Rt sided weakness. MRI showed no acute intracranial process.  PMH includes: dementia; HTN; Gout    PT Comments    Pt. Reported no pain during treatment session. Ambulated with no problems, ability to follow simple commands and was strong up and down stairs doing it with very little assistance. She would benefit from more bed mobility practice and balance training at a SNF upon d/c to be more independent before going home.  Follow Up Recommendations  SNF     Equipment Recommendations  Rolling walker with 5" wheels;3in1 (PT)    Recommendations for Other Services OT consult     Precautions / Restrictions Precautions Precautions: Fall Restrictions Weight Bearing Restrictions: No    Mobility  Bed Mobility Overal bed mobility: Needs Assistance Bed Mobility: Supine to Sit     Supine to sit: Min assist     General bed mobility comments: Used bedrails to pull to sit; min assist to square off hips at EOB using bed pad  Transfers Overall transfer level: Needs assistance Equipment used: Rolling walker (2 wheeled) Transfers: Sit to/from [employer]GI CorporationStand;Stand Pivot Transfers Sit to Stand: Min guard Stand pivot transfers: Min guard       General transfer comment: vc's for hand placement  Ambulation/Gait Ambulation/Gait assistance: Min guard Ambulation Distance (Feet): 50 Feet Assistive device: Rolling walker (2 wheeled) Gait Pattern/deviations: Step-through pattern Gait velocity: decreased Gait velocity interpretation: Below normal speed for age/gender General Gait Details: Overall steady on her feet using RW   Stairs Stairs: Yes Stairs assistance: Min guard Stair Management: One rail Right Number of Stairs: 3 General stair comments: min guard for  safety; three stairs x 4; impulsive to start on them, vc's to slow down. First two  rail used , Second one rail used and HHA, Third and fourth one rail used and no Futures traderHHA  Wheelchair Mobility    Modified Rankin (Stroke Patients Only) Modified Rankin (Stroke Patients Only) Pre-Morbid Rankin Score: Moderate disability Modified Rankin: Moderately severe disability     Balance Overall balance assessment: Needs assistance Sitting-balance support: Feet supported;Bilateral upper extremity supported   Sitting balance - Comments: used bilateral support but did not try to see if pt. could balance with out   Standing balance support: During functional activity;Bilateral upper extremity supported Standing balance-Leahy Scale: Poor Standing balance comment: did not formally assess balance without walker but no LOB while walking                    Cognition Arousal/Alertness: Awake/alert Behavior During Therapy: WFL for tasks assessed/performed Overall Cognitive Status: History of cognitive impairments - at baseline                      Exercises      General Comments        Pertinent Vitals/Pain Pain Assessment: No/denies pain    Home Living                      Prior Function            PT Goals (current goals can now be found in the care plan section) Progress towards PT goals: Progressing toward goals    Frequency  Min 3X/week    PT Plan Current plan remains  appropriate    Co-evaluation             End of Session Equipment Utilized During Treatment: Gait belt Activity Tolerance: Patient tolerated treatment well Patient left: in chair;with call bell/phone within reach;with chair alarm set;with family/visitor present     Time: 1312-1340 PT Time Calculation (min) (ACUTE ONLY): 28 min  Charges:                       G Codes:      Perlie Mayo, SPTA 11/03/2014, 1:54 PM  COMPLETED STEPS TO PRACTICE AS SHE HAS IN HOME ENVORIMENT

## 2014-11-03 NOTE — Progress Notes (Signed)
UR completed 

## 2014-11-03 NOTE — Progress Notes (Signed)
Patient discharged to [facility] via ambulance

## 2014-11-08 ENCOUNTER — Encounter: Payer: Self-pay | Admitting: Internal Medicine

## 2014-11-08 ENCOUNTER — Non-Acute Institutional Stay (SKILLED_NURSING_FACILITY): Payer: Medicare HMO | Admitting: Internal Medicine

## 2014-11-08 DIAGNOSIS — E038 Other specified hypothyroidism: Secondary | ICD-10-CM

## 2014-11-08 DIAGNOSIS — G459 Transient cerebral ischemic attack, unspecified: Secondary | ICD-10-CM

## 2014-11-08 DIAGNOSIS — F0390 Unspecified dementia without behavioral disturbance: Secondary | ICD-10-CM

## 2014-11-08 DIAGNOSIS — E034 Atrophy of thyroid (acquired): Secondary | ICD-10-CM

## 2014-11-08 DIAGNOSIS — I10 Essential (primary) hypertension: Secondary | ICD-10-CM | POA: Diagnosis not present

## 2014-11-08 DIAGNOSIS — M1000 Idiopathic gout, unspecified site: Secondary | ICD-10-CM

## 2014-11-08 DIAGNOSIS — N183 Chronic kidney disease, stage 3 (moderate): Secondary | ICD-10-CM

## 2014-11-08 NOTE — Progress Notes (Addendum)
Patient ID: Kristie Gonzales, female   DOB: 05-Apr-1926, 81 y.o.   MRN: [id number]    HISTORY AND PHYSICAL  Location:    [facility]   Place of Service:   SNF  Extended Emergency Contact Information Primary Emergency Contact: Ewing Schlein [place] Phone: [phone] Relation: Daughter  Advanced Directive information  DNR; comfort care; MOST form on chart- no feeding tube  Chief Complaint  Patient presents with  . New Admit To SNF    dementia, falls, gout, TIA, hypothyroidism, hyperlipidemia, HTN, CKD stage 3-4, hyperlipidemia    HPI:  81 yo female seen today as a new admission into SNF for above. She has no concerns and is eating and sleeping well. No nursing issues. Her MRI/MRA of brain was neg for acute changes. She is in SNF for comfort care due to end stage dementia  Hypothyroidism is stable on levothyroxine.  Cholesterol is stable on lipitor.  She does not take medications for her dementia.  BP is stable on new meds.  Gout flare improving on prednisone  Past Medical History  Diagnosis Date  . Thyroid disease   . Hypertension   . Pneumonia 04/19/12  . Hypothyroidism     Past Surgical History  Procedure Laterality Date  . Abdominal hysterectomy    . Appendectomy      Patient Care Team: Criselda Peaches, MD as PCP - General (Internal Medicine)  History   Social History  . Marital Status: Widowed    Spouse Name: N/A    Number of Children: N/A  . Years of Education: N/A   Occupational History  . Not on file.   Social History Main Topics  . Smoking status: Never Smoker   . Smokeless tobacco: Not on file  . Alcohol Use: No  . Drug Use: No  . Sexual Activity: Not on file   Other Topics Concern  . Not on file   Social History Narrative     reports that she has never smoked. She does not have any smokeless tobacco history on file. She reports that she does not drink alcohol or use illicit drugs.  Family History    Problem Relation Age of Onset  . Diabetes Son   . Heart disease Son    Family Status  Relation Status Death Age  . Mother Deceased   . Father Deceased    Past medical, surgical, family and social history reviewed   There is no immunization history on file for this patient.  Allergies  Allergen Reactions  . Sulfonamide Derivatives Other (See Comments)    Unknown allergic reaction    Medications: Patient's Medications  New Prescriptions   No medications on file  Previous Medications   ASPIRIN 325 MG TABLET    Take 1 tablet (325 mg total) by mouth daily.   ATORVASTATIN (LIPITOR) 40 MG TABLET    Take 1 tablet (40 mg total) by mouth daily at 6 PM.   LEVOTHYROXINE (SYNTHROID, LEVOTHROID) 100 MCG TABLET    Take 100 mcg by mouth daily.   LOPERAMIDE (IMODIUM) 2 MG CAPSULE    Take 2 mg by mouth 4 (four) times daily as needed for diarrhea or loose stools.    MUPIROCIN OINTMENT (BACTROBAN) 2 %    Apply topically 3 (three) times daily.   PREDNISONE (DELTASONE) 20 MG TABLET    Take 2 daily for 3 days for inflammation in foot  Modified Medications   No medications on file  Discontinued Medications  No medications on file    Review of Systems  Unable to perform ROS: Dementia      Filed Vitals:   11/05/14 0132  BP: 141/78  Pulse: 76  Temp: 96 F (35.6 C)  SpO2: 98%   There is no weight on file to calculate BMI.  Physical Exam CONSTITUTIONAL: Looks frail in NAD. Awake and alert HEENT: PERRLA. No scleral icterus. Oropharynx clear and without exudate. MMM NECK: Supple. Nontender. No palpable cervical or supraclavicular lymph nodes. No carotid bruit b/l.  CVS: Regular rate without murmur, gallop or rub. LUNGS: CTA b/l no wheezing, rales or rhonchi. ABDOMEN: Bowel sounds present x 4. Soft, nontender, nondistended. No palpable mass or bruit EXTREMITIES: No edema b/l. Distal pulses palpable. No calf tenderness PSYCH: she is not anxious SKIN: bruising noted in various stages of  healing.    Labs reviewed: Admission on 10/31/2014, Discharged on 11/03/2014  Component Date Value Ref Range Status  . Alcohol, Ethyl (B) 10/31/2014 <5  0 - 9 mg/dL Final   Comment:        LOWEST DETECTABLE LIMIT FOR SERUM ALCOHOL IS 11 mg/dL FOR MEDICAL PURPOSES ONLY   . Sodium 10/31/2014 142  135 - 145 mmol/L Final  . Potassium 10/31/2014 3.4* 3.5 - 5.1 mmol/L Final  . Chloride 10/31/2014 102  96 - 112 mmol/L Final  . BUN 10/31/2014 30* 6 - 23 mg/dL Final  . Creatinine, Ser 10/31/2014 2.10* 0.50 - 1.10 mg/dL Final  . Glucose, Bld 10/31/2014 135* 70 - 99 mg/dL Final  . Calcium, Ion 10/31/2014 1.13  1.13 - 1.30 mmol/L Final  . TCO2 10/31/2014 22  0 - 100 mmol/L Final  . Hemoglobin 10/31/2014 16.3* 12.0 - 15.0 g/dL Final  . HCT 10/31/2014 48.0* 36.0 - 46.0 % Final  . Prothrombin Time 10/31/2014 12.9  11.6 - 15.2 seconds Final  . INR 10/31/2014 0.96  0.00 - 1.49 Final  . aPTT 10/31/2014 30  24 - 37 seconds Final  . WBC 10/31/2014 11.7* 4.0 - 10.5 K/uL Final  . RBC 10/31/2014 4.75  3.87 - 5.11 MIL/uL Final  . Hemoglobin 10/31/2014 14.9  12.0 - 15.0 g/dL Final  . HCT 10/31/2014 43.1  36.0 - 46.0 % Final  . MCV 10/31/2014 90.7  78.0 - 100.0 fL Final  . MCH 10/31/2014 31.4  26.0 - 34.0 pg Final  . MCHC 10/31/2014 34.6  30.0 - 36.0 g/dL Final  . RDW 10/31/2014 13.6  11.5 - 15.5 % Final  . Platelets 10/31/2014 276  150 - 400 K/uL Final  . Neutrophils Relative % 10/31/2014 80* 43 - 77 % Final  . Neutro Abs 10/31/2014 9.4* 1.7 - 7.7 K/uL Final  . Lymphocytes Relative 10/31/2014 9* 12 - 46 % Final  . Lymphs Abs 10/31/2014 1.1  0.7 - 4.0 K/uL Final  . Monocytes Relative 10/31/2014 8  3 - 12 % Final  . Monocytes Absolute 10/31/2014 0.9  0.1 - 1.0 K/uL Final  . Eosinophils Relative 10/31/2014 3  0 - 5 % Final  . Eosinophils Absolute 10/31/2014 0.3  0.0 - 0.7 K/uL Final  . Basophils Relative 10/31/2014 0  0 - 1 % Final  . Basophils Absolute 10/31/2014 0.0  0.0 - 0.1 K/uL Final  . Sodium  10/31/2014 142  135 - 145 mmol/L Final  . Potassium 10/31/2014 3.4* 3.5 - 5.1 mmol/L Final  . Chloride 10/31/2014 106  96 - 112 mmol/L Final  . CO2 10/31/2014 29  19 - 32 mmol/L Final  .  Glucose, Bld 10/31/2014 138* 70 - 99 mg/dL Final  . BUN 10/31/2014 27* 6 - 23 mg/dL Final  . Creatinine, Ser 10/31/2014 2.28* 0.50 - 1.10 mg/dL Final  . Calcium 10/31/2014 9.4  8.4 - 10.5 mg/dL Final  . Total Protein 10/31/2014 6.8  6.0 - 8.3 g/dL Final  . Albumin 10/31/2014 3.9  3.5 - 5.2 g/dL Final  . AST 10/31/2014 22  0 - 37 U/L Final  . ALT 10/31/2014 10  0 - 35 U/L Final  . Alkaline Phosphatase 10/31/2014 71  39 - 117 U/L Final  . Total Bilirubin 10/31/2014 0.6  0.3 - 1.2 mg/dL Final  . GFR calc non Af Amer 10/31/2014 18* >90 mL/min Final  . GFR calc Af Amer 10/31/2014 21* >90 mL/min Final   Comment: (NOTE) The eGFR has been calculated using the CKD EPI equation. This calculation has not been validated in all clinical situations. eGFR's persistently <90 mL/min signify possible Chronic Kidney Disease.   . Anion gap 10/31/2014 7  5 - 15 Final  . Troponin i, poc 10/31/2014 0.02  0.00 - 0.08 ng/mL Final  . Comment 3 10/31/2014          Final   Comment: Due to the release kinetics of cTnI, a negative result within the first hours of the onset of symptoms does not rule out myocardial infarction with certainty. If myocardial infarction is still suspected, repeat the test at appropriate intervals.   . Opiates 10/31/2014 NONE DETECTED  NONE DETECTED Final  . Cocaine 10/31/2014 NONE DETECTED  NONE DETECTED Final  . Benzodiazepines 10/31/2014 NONE DETECTED  NONE DETECTED Final  . Amphetamines 10/31/2014 NONE DETECTED  NONE DETECTED Final  . Tetrahydrocannabinol 10/31/2014 NONE DETECTED  NONE DETECTED Final  . Barbiturates 10/31/2014 NONE DETECTED  NONE DETECTED Final   Comment:        DRUG SCREEN FOR MEDICAL PURPOSES ONLY.  IF CONFIRMATION IS NEEDED FOR ANY PURPOSE, NOTIFY LAB WITHIN 5 DAYS.         LOWEST DETECTABLE LIMITS FOR URINE DRUG SCREEN Drug Class       Cutoff (ng/mL) Amphetamine      1000 Barbiturate      200 Benzodiazepine   235 Tricyclics       573 Opiates          300 Cocaine          300 THC              50   . Color, Urine 10/31/2014 YELLOW  YELLOW Final  . APPearance 10/31/2014 CLEAR  CLEAR Final  . Specific Gravity, Urine 10/31/2014 1.014  1.005 - 1.030 Final  . pH 10/31/2014 5.0  5.0 - 8.0 Final  . Glucose, UA 10/31/2014 NEGATIVE  NEGATIVE mg/dL Final  . Hgb urine dipstick 10/31/2014 SMALL* NEGATIVE Final  . Bilirubin Urine 10/31/2014 NEGATIVE  NEGATIVE Final  . Ketones, ur 10/31/2014 NEGATIVE  NEGATIVE mg/dL Final  . Protein, ur 10/31/2014 NEGATIVE  NEGATIVE mg/dL Final  . Urobilinogen, UA 10/31/2014 0.2  0.0 - 1.0 mg/dL Final  . Nitrite 10/31/2014 NEGATIVE  NEGATIVE Final  . Leukocytes, UA 10/31/2014 MODERATE* NEGATIVE Final  . Squamous Epithelial / LPF 10/31/2014 RARE  RARE Final  . WBC, UA 10/31/2014 3-6  <3 WBC/hpf Final  . RBC / HPF 10/31/2014 0-2  <3 RBC/hpf Final  . Bacteria, UA 10/31/2014 RARE  RARE Final  . Casts 10/31/2014 GRANULAR CAST* NEGATIVE Final  . Hgb A1c MFr Bld 10/31/2014 5.9* 4.8 -  5.6 % Final   Comment: (NOTE)         Pre-diabetes: 5.7 - 6.4         Diabetes: >6.4         Glycemic control for adults with diabetes: <7.0   . Mean Plasma Glucose 10/31/2014 123   Final   Comment: (NOTE) Performed At: [facility] [place], [place] [id number] Lindon Romp MD VO:[id number]   . Cholesterol 11/01/2014 207* 0 - 200 mg/dL Final  . Triglycerides 11/01/2014 128  <150 mg/dL Final  . HDL 11/01/2014 45  >39 mg/dL Final  . Total CHOL/HDL Ratio 11/01/2014 4.6   Final  . VLDL 11/01/2014 26  0 - 40 mg/dL Final  . LDL Cholesterol 11/01/2014 136* 0 - 99 mg/dL Final   Comment:        Total Cholesterol/HDL:CHD Risk Coronary Heart Disease Risk Table                     Men   Women  1/2 Average Risk   3.4   3.3   Average Risk       5.0   4.4  2 X Average Risk   9.6   7.1  3 X Average Risk  23.4   11.0        Use the calculated Patient Ratio above and the CHD Risk Table to determine the patient's CHD Risk.        ATP III CLASSIFICATION (LDL):  <100     mg/dL   Optimal  100-129  mg/dL   Near or Above                    Optimal  130-159  mg/dL   Borderline  160-189  mg/dL   High  >190     mg/dL   Very High   . Opiates 11/01/2014 NONE DETECTED  NONE DETECTED Final  . Cocaine 11/01/2014 NONE DETECTED  NONE DETECTED Final  . Benzodiazepines 11/01/2014 NONE DETECTED  NONE DETECTED Final  . Amphetamines 11/01/2014 NONE DETECTED  NONE DETECTED Final  . Tetrahydrocannabinol 11/01/2014 NONE DETECTED  NONE DETECTED Final  . Barbiturates 11/01/2014 NONE DETECTED  NONE DETECTED Final   Comment:        DRUG SCREEN FOR MEDICAL PURPOSES ONLY.  IF CONFIRMATION IS NEEDED FOR ANY PURPOSE, NOTIFY LAB WITHIN 5 DAYS.        LOWEST DETECTABLE LIMITS FOR URINE DRUG SCREEN Drug Class       Cutoff (ng/mL) Amphetamine      1000 Barbiturate      200 Benzodiazepine   694 Tricyclics       854 Opiates          300 Cocaine          300 THC              50   . TSH 11/01/2014 0.522  0.350 - 4.500 uIU/mL Final  . Total CK 11/01/2014 233* 7 - 177 U/L Final  . WBC 11/01/2014 9.6  4.0 - 10.5 K/uL Final  . RBC 11/01/2014 4.71  3.87 - 5.11 MIL/uL Final  . Hemoglobin 11/01/2014 14.8  12.0 - 15.0 g/dL Final  . HCT 11/01/2014 43.0  36.0 - 46.0 % Final  . MCV 11/01/2014 91.3  78.0 - 100.0 fL Final  . MCH 11/01/2014 31.4  26.0 - 34.0 pg Final  . MCHC 11/01/2014 34.4  30.0 - 36.0 g/dL Final  .  RDW 11/01/2014 13.6  11.5 - 15.5 % Final  . Platelets 11/01/2014 284  150 - 400 K/uL Final  . Sodium 11/01/2014 138  135 - 145 mmol/L Final  . Potassium 11/01/2014 4.1  3.5 - 5.1 mmol/L Final   DELTA CHECK NOTED  . Chloride 11/01/2014 104  96 - 112 mmol/L Final  . CO2 11/01/2014 26  19 - 32 mmol/L Final  . Glucose, Bld  11/01/2014 103* 70 - 99 mg/dL Final  . BUN 11/01/2014 21  6 - 23 mg/dL Final  . Creatinine, Ser 11/01/2014 1.79* 0.50 - 1.10 mg/dL Final  . Calcium 11/01/2014 9.0  8.4 - 10.5 mg/dL Final  . GFR calc non Af Amer 11/01/2014 25* >90 mL/min Final  . GFR calc Af Amer 11/01/2014 28* >90 mL/min Final   Comment: (NOTE) The eGFR has been calculated using the CKD EPI equation. This calculation has not been validated in all clinical situations. eGFR's persistently <90 mL/min signify possible Chronic Kidney Disease.   . Anion gap 11/01/2014 8  5 - 15 Final  . Glucose-Capillary 11/01/2014 95  70 - 99 mg/dL Final    Dg Elbow Complete Right  10/31/2014   CLINICAL DATA:  Patient fell in kitchen landing on right side of body. Elbow bruised and swollen  EXAM: RIGHT ELBOW - COMPLETE 3+ VIEW  COMPARISON:  None.  FINDINGS: Frontal, lateral, and bilateral oblique views were obtained. There is no fracture, dislocation, or effusion. Joint spaces appear intact. There is a small spur arising from the medial distal humeral condyle. No erosive change.  IMPRESSION: Small spur arising from the medial distal humeral condyle. No fracture or dislocation. No appreciable joint effusion.   Electronically Signed   By: Lowella Grip M.D.   On: 10/31/2014 19:47   Ct Head Wo Contrast  10/31/2014   CLINICAL DATA:  Right-sided weakness, status post fall  EXAM: CT HEAD WITHOUT CONTRAST  CT CERVICAL SPINE WITHOUT CONTRAST  TECHNIQUE: Multidetector CT imaging of the head and cervical spine was performed following the standard protocol without intravenous contrast. Multiplanar CT image reconstructions of the cervical spine were also generated.  COMPARISON:  None.  FINDINGS: CT HEAD FINDINGS  There is no evidence of mass effect, midline shift, or extra-axial fluid collections. There is no evidence of a space-occupying lesion or intracranial hemorrhage. There is no evidence of a cortical-based area of acute infarction. There is generalized  cerebral atrophy. There is periventricular white matter low attenuation likely secondary to microangiopathy.  The ventricles and sulci are appropriate for the patient's age. The basal cisterns are patent.  Visualized portions of the orbits are unremarkable. The visualized portions of the paranasal sinuses and mastoid air cells are unremarkable. Cerebrovascular atherosclerotic calcifications are noted.  The osseous structures are unremarkable. There is a right frontal scalp contusion.  CT CERVICAL SPINE FINDINGS  The alignment is anatomic. The vertebral body heights are maintained. There is no acute fracture. There is no static listhesis. The prevertebral soft tissues are normal. The intraspinal soft tissues are not fully imaged on this examination due to poor soft tissue contrast, but there is no gross soft tissue abnormality.  There is degenerative disc disease at C5-6 and C6-7. There is bilateral uncovertebral degenerative change at C6-7 with bilateral foraminal narrowing. Bilateral mild facet arthropathy and uncovertebral degenerative changes C5-6 resulting in bilateral foraminal narrowing.  There is biapical scarring.There is bilateral carotid artery atherosclerosis.  IMPRESSION: 1. No acute intracranial pathology. 2. No acute osseous injury of the cervical  spine.   Electronically Signed   By: Kathreen Devoid   On: 10/31/2014 18:04   Ct Cervical Spine Wo Contrast  10/31/2014   CLINICAL DATA:  Right-sided weakness, status post fall  EXAM: CT HEAD WITHOUT CONTRAST  CT CERVICAL SPINE WITHOUT CONTRAST  TECHNIQUE: Multidetector CT imaging of the head and cervical spine was performed following the standard protocol without intravenous contrast. Multiplanar CT image reconstructions of the cervical spine were also generated.  COMPARISON:  None.  FINDINGS: CT HEAD FINDINGS  There is no evidence of mass effect, midline shift, or extra-axial fluid collections. There is no evidence of a space-occupying lesion or  intracranial hemorrhage. There is no evidence of a cortical-based area of acute infarction. There is generalized cerebral atrophy. There is periventricular white matter low attenuation likely secondary to microangiopathy.  The ventricles and sulci are appropriate for the patient's age. The basal cisterns are patent.  Visualized portions of the orbits are unremarkable. The visualized portions of the paranasal sinuses and mastoid air cells are unremarkable. Cerebrovascular atherosclerotic calcifications are noted.  The osseous structures are unremarkable. There is a right frontal scalp contusion.  CT CERVICAL SPINE FINDINGS  The alignment is anatomic. The vertebral body heights are maintained. There is no acute fracture. There is no static listhesis. The prevertebral soft tissues are normal. The intraspinal soft tissues are not fully imaged on this examination due to poor soft tissue contrast, but there is no gross soft tissue abnormality.  There is degenerative disc disease at C5-6 and C6-7. There is bilateral uncovertebral degenerative change at C6-7 with bilateral foraminal narrowing. Bilateral mild facet arthropathy and uncovertebral degenerative changes C5-6 resulting in bilateral foraminal narrowing.  There is biapical scarring.There is bilateral carotid artery atherosclerosis.  IMPRESSION: 1. No acute intracranial pathology. 2. No acute osseous injury of the cervical spine.   Electronically Signed   By: Kathreen Devoid   On: 10/31/2014 18:04   Mri Brain Without Contrast  11/01/2014   CLINICAL DATA:  Found down at 3:45 p.m. yesterday, RIGHT-sided weakness. History of dementia, hypertension, hypothyroidism and chronic kidney disease.  EXAM: MRI HEAD WITHOUT CONTRAST  MRA HEAD WITHOUT CONTRAST  TECHNIQUE: Multiplanar, multiecho pulse sequences of the brain and surrounding structures were obtained without intravenous contrast. Angiographic images of the head were obtained using MRA technique without contrast.   COMPARISON:  CT of the head October 31, 2013 and MRI of the head April 21, 2012  FINDINGS: MRI HEAD FINDINGS  No reduced diffusion to suggest acute ischemia. No susceptibility artifact to suggest hemorrhage.  Moderate ventriculomegaly, likely on the basis of global parenchymal brain volume loss as there is overall commensurate enlargement of the cerebral sulci and cerebellar folia, within normal range for patient's age. No midline shift, mass effect or mass lesions. Confluent supratentorial white matter T2 hyperintensities are relatively unchanged from prior imaging. Tiny T2 hyperintensities in the basal ganglia are similar.  No abnormal extra-axial fluid collections. Status post bilateral ocular lens implants. Mild paranasal sinus mucosal thickening, small sphenoid probable mucosal retention cyst. The mastoid air cells are well aerated. No abnormal sellar expansion. No cerebellar tonsillar ectopia. No suspicious calvarial bone marrow signal. Patient is edentulous.  MRA HEAD FINDINGS  Anterior circulation: Normal flow related enhancement of the included cervical, petrous, cavernous and supra clinoid internal carotid arteries. Patent anterior communicating artery. Diminutive LEFT A1 segment likely on congenital basis. Normal flow related enhancement of the anterior and middle cerebral arteries, including more distal segments. Azygos ACA.  No large  vessel occlusion, high-grade stenosis, aneurysm. Mild proximal luminal irregularity.  Posterior circulation: LEFT vertebral artery is dominant. Basilar artery is patent, with normal flow related enhancement of the main branch vessels. Normal flow related enhancement of the posterior cerebral arteries.  No large vessel occlusion, high-grade stenosis, aneurysm. Mild mid to distal luminal irregularity.  IMPRESSION: MRI HEAD: No acute intracranial process, specifically no evidence of acute ischemia.  Involutional changes. Moderate white matter changes can be seen with chronic  small vessel ischemic disease. Tiny stable T2 hyperintensities in the basal ganglia suggest perivascular spaces and/or lacunar infarcts.  MRA HEAD: No high-grade stenosis or large vessel occlusion.  Mild luminal irregularity of the anterior and posterior circulation most consistent with atherosclerosis.   Electronically Signed   By: Elon Alas   On: 11/01/2014 03:07   Mr Jodene Nam Head/brain Wo Cm  11/01/2014   CLINICAL DATA:  Found down at 3:45 p.m. yesterday, RIGHT-sided weakness. History of dementia, hypertension, hypothyroidism and chronic kidney disease.  EXAM: MRI HEAD WITHOUT CONTRAST  MRA HEAD WITHOUT CONTRAST  TECHNIQUE: Multiplanar, multiecho pulse sequences of the brain and surrounding structures were obtained without intravenous contrast. Angiographic images of the head were obtained using MRA technique without contrast.  COMPARISON:  CT of the head October 31, 2013 and MRI of the head April 21, 2012  FINDINGS: MRI HEAD FINDINGS  No reduced diffusion to suggest acute ischemia. No susceptibility artifact to suggest hemorrhage.  Moderate ventriculomegaly, likely on the basis of global parenchymal brain volume loss as there is overall commensurate enlargement of the cerebral sulci and cerebellar folia, within normal range for patient's age. No midline shift, mass effect or mass lesions. Confluent supratentorial white matter T2 hyperintensities are relatively unchanged from prior imaging. Tiny T2 hyperintensities in the basal ganglia are similar.  No abnormal extra-axial fluid collections. Status post bilateral ocular lens implants. Mild paranasal sinus mucosal thickening, small sphenoid probable mucosal retention cyst. The mastoid air cells are well aerated. No abnormal sellar expansion. No cerebellar tonsillar ectopia. No suspicious calvarial bone marrow signal. Patient is edentulous.  MRA HEAD FINDINGS  Anterior circulation: Normal flow related enhancement of the included cervical, petrous, cavernous  and supra clinoid internal carotid arteries. Patent anterior communicating artery. Diminutive LEFT A1 segment likely on congenital basis. Normal flow related enhancement of the anterior and middle cerebral arteries, including more distal segments. Azygos ACA.  No large vessel occlusion, high-grade stenosis, aneurysm. Mild proximal luminal irregularity.  Posterior circulation: LEFT vertebral artery is dominant. Basilar artery is patent, with normal flow related enhancement of the main branch vessels. Normal flow related enhancement of the posterior cerebral arteries.  No large vessel occlusion, high-grade stenosis, aneurysm. Mild mid to distal luminal irregularity.  IMPRESSION: MRI HEAD: No acute intracranial process, specifically no evidence of acute ischemia.  Involutional changes. Moderate white matter changes can be seen with chronic small vessel ischemic disease. Tiny stable T2 hyperintensities in the basal ganglia suggest perivascular spaces and/or lacunar infarcts.  MRA HEAD: No high-grade stenosis or large vessel occlusion.  Mild luminal irregularity of the anterior and posterior circulation most consistent with atherosclerosis.   Electronically Signed   By: Elon Alas   On: 11/01/2014 03:07   Hospital records reviewed - UDS neg. LDL chol 136; total cholesterol 207. Cr 1.79. TSH 0.55   Assessment/Plan   ICD-9-CM ICD-10-CM   1. Dementia, without behavioral disturbance - end stage 294.20 F03.90   2. Essential hypertension, benign - stable on no meds 401.1 I10   3.  CKD (chronic kidney disease), stage 3 (moderate) - stable 585.3 N18.3   4. Hypothyroidism due to acquired atrophy of thyroid - controlled on levothyroxine 244.8 E03.8    246.8 E03.4   5. Transient cerebral ischemia, unspecified transient cerebral ischemia type - stable on ASA 435.9 G45.9   6.      Gout flare- continue prednisone  --f/u with Neurology as scheduled next month  --PT/OT/ST as ordered  --continue current  medications as ordered  GOAL: comfort care. MOST form on chart and no feeding tube desired. She is DNR. Communicated with nursing   Pelham S. Perlie Gold  [facility] [address] [place],  [address] [phone] Office (Wednesdays and Fridays 8 AM - 5 PM) [phone] Cell (Monday-Friday 8 AM - 5 PM)

## 2014-11-26 ENCOUNTER — Encounter: Payer: Self-pay | Admitting: Internal Medicine

## 2014-11-26 ENCOUNTER — Non-Acute Institutional Stay (SKILLED_NURSING_FACILITY): Payer: Medicare HMO | Admitting: Internal Medicine

## 2014-11-26 DIAGNOSIS — F0390 Unspecified dementia without behavioral disturbance: Secondary | ICD-10-CM | POA: Diagnosis not present

## 2014-11-26 DIAGNOSIS — J302 Other seasonal allergic rhinitis: Secondary | ICD-10-CM | POA: Diagnosis not present

## 2014-11-26 DIAGNOSIS — J309 Allergic rhinitis, unspecified: Secondary | ICD-10-CM | POA: Insufficient documentation

## 2014-11-26 NOTE — Progress Notes (Signed)
Patient ID: Kristie Gonzales, female   DOB: [birth year], 81 y.o.   MRN: [id number]    [facility]     Place of Service: SNF (31)    Allergies  Allergen Reactions  . Sulfonamide Derivatives Other (See Comments)    Unknown allergic reaction    Chief Complaint  Patient presents with  . Acute Visit    rhinorrhea    HPI:  81 yo female long term resident seen today for acute visit. She c/o 1 day hx URI sx's with rhinorrhea, productive cough. No CP, SOB, f/c. No sore throat. She is a poor historian due to her dementia. She takes no medicine for dementia and it has been stable  CODE STATUS: DNR; MOST form on chart- no feeding tube  Past Medical History  Diagnosis Date  . Thyroid disease   . Hypertension   . Pneumonia 04/19/12  . Hypothyroidism    Past Surgical History  Procedure Laterality Date  . Abdominal hysterectomy    . Appendectomy     History   Social History  . Marital Status: Widowed    Spouse Name: N/A  . Number of Children: N/A  . Years of Education: N/A   Social History Main Topics  . Smoking status: Never Smoker   . Smokeless tobacco: Not on file  . Alcohol Use: No  . Drug Use: No  . Sexual Activity: Not on file   Other Topics Concern  . None   Social History Narrative     Medications: Patient's Medications  New Prescriptions   No medications on file  Previous Medications   ASPIRIN 325 MG TABLET    Take 1 tablet (325 mg total) by mouth daily.   ATORVASTATIN (LIPITOR) 40 MG TABLET    Take 1 tablet (40 mg total) by mouth daily at 6 PM.   LEVOTHYROXINE (SYNTHROID, LEVOTHROID) 100 MCG TABLET    Take 100 mcg by mouth daily.   LOPERAMIDE (IMODIUM) 2 MG CAPSULE    Take 2 mg by mouth 4 (four) times daily as needed for diarrhea or loose stools.    MUPIROCIN OINTMENT (BACTROBAN) 2 %    Apply topically 3 (three) times daily.   PREDNISONE (DELTASONE) 20 MG TABLET    Take 2 daily for 3 days for inflammation in foot  Modified  Medications   No medications on file  Discontinued Medications   No medications on file     Review of Systems  Unable to perform ROS: Dementia    Filed Vitals:   11/26/14 1706  BP: 116/88  Pulse: 72  Temp: 98.7 F (37.1 C)  SpO2: 97%   There is no weight on file to calculate BMI.  Physical Exam  Constitutional:  Frail appearing in NAD. Awake and alert  HENT:  Mouth/Throat: Oropharynx is clear and moist and mucous membranes are normal. No oropharyngeal exudate.    Eyes: Pupils are equal, round, and reactive to light. No scleral icterus.  Neck: Neck supple. No tracheal deviation present.  Cardiovascular: Normal rate, regular rhythm, normal heart sounds and intact distal pulses.  Exam reveals no gallop and no friction rub.   No murmur heard. No LE edema b/l. no calf TTP. No carotid bruit b/l  Pulmonary/Chest: Effort normal and breath sounds normal. No stridor. No respiratory distress. She has no wheezes. She has no rales.  Lymphadenopathy:       Head (right side): No submandibular and no posterior auricular adenopathy present.  Head (left side): No submandibular and no posterior auricular adenopathy present.    She has no cervical adenopathy.       Right: No supraclavicular adenopathy present.       Left: No supraclavicular adenopathy present.  Skin: Skin is warm and dry. No rash noted.     Labs reviewed: Admission on 10/31/2014, Discharged on 11/03/2014  Component Date Value Ref Range Status  . Alcohol, Ethyl (B) 10/31/2014 <5  0 - 9 mg/dL Final   Comment:        LOWEST DETECTABLE LIMIT FOR SERUM ALCOHOL IS 11 mg/dL FOR MEDICAL PURPOSES ONLY   . Sodium 10/31/2014 142  135 - 145 mmol/L Final  . Potassium 10/31/2014 3.4* 3.5 - 5.1 mmol/L Final  . Chloride 10/31/2014 102  96 - 112 mmol/L Final  . BUN 10/31/2014 30* 6 - 23 mg/dL Final  . Creatinine, Ser 10/31/2014 2.10* 0.50 - 1.10 mg/dL Final  . Glucose, Bld 10/31/2014 135* 70 - 99 mg/dL Final  . Calcium,  Ion 10/31/2014 1.13  1.13 - 1.30 mmol/L Final  . TCO2 10/31/2014 22  0 - 100 mmol/L Final  . Hemoglobin 10/31/2014 16.3* 12.0 - 15.0 g/dL Final  . HCT 10/31/2014 48.0* 36.0 - 46.0 % Final  . Prothrombin Time 10/31/2014 12.9  11.6 - 15.2 seconds Final  . INR 10/31/2014 0.96  0.00 - 1.49 Final  . aPTT 10/31/2014 30  24 - 37 seconds Final  . WBC 10/31/2014 11.7* 4.0 - 10.5 K/uL Final  . RBC 10/31/2014 4.75  3.87 - 5.11 MIL/uL Final  . Hemoglobin 10/31/2014 14.9  12.0 - 15.0 g/dL Final  . HCT 10/31/2014 43.1  36.0 - 46.0 % Final  . MCV 10/31/2014 90.7  78.0 - 100.0 fL Final  . MCH 10/31/2014 31.4  26.0 - 34.0 pg Final  . MCHC 10/31/2014 34.6  30.0 - 36.0 g/dL Final  . RDW 10/31/2014 13.6  11.5 - 15.5 % Final  . Platelets 10/31/2014 276  150 - 400 K/uL Final  . Neutrophils Relative % 10/31/2014 80* 43 - 77 % Final  . Neutro Abs 10/31/2014 9.4* 1.7 - 7.7 K/uL Final  . Lymphocytes Relative 10/31/2014 9* 12 - 46 % Final  . Lymphs Abs 10/31/2014 1.1  0.7 - 4.0 K/uL Final  . Monocytes Relative 10/31/2014 8  3 - 12 % Final  . Monocytes Absolute 10/31/2014 0.9  0.1 - 1.0 K/uL Final  . Eosinophils Relative 10/31/2014 3  0 - 5 % Final  . Eosinophils Absolute 10/31/2014 0.3  0.0 - 0.7 K/uL Final  . Basophils Relative 10/31/2014 0  0 - 1 % Final  . Basophils Absolute 10/31/2014 0.0  0.0 - 0.1 K/uL Final  . Sodium 10/31/2014 142  135 - 145 mmol/L Final  . Potassium 10/31/2014 3.4* 3.5 - 5.1 mmol/L Final  . Chloride 10/31/2014 106  96 - 112 mmol/L Final  . CO2 10/31/2014 29  19 - 32 mmol/L Final  . Glucose, Bld 10/31/2014 138* 70 - 99 mg/dL Final  . BUN 10/31/2014 27* 6 - 23 mg/dL Final  . Creatinine, Ser 10/31/2014 2.28* 0.50 - 1.10 mg/dL Final  . Calcium 10/31/2014 9.4  8.4 - 10.5 mg/dL Final  . Total Protein 10/31/2014 6.8  6.0 - 8.3 g/dL Final  . Albumin 10/31/2014 3.9  3.5 - 5.2 g/dL Final  . AST 10/31/2014 22  0 - 37 U/L Final  . ALT 10/31/2014 10  0 - 35 U/L Final  . Alkaline Phosphatase  10/31/2014 71  39 - 117 U/L Final  . Total Bilirubin 10/31/2014 0.6  0.3 - 1.2 mg/dL Final  . GFR calc non Af Amer 10/31/2014 18* >90 mL/min Final  . GFR calc Af Amer 10/31/2014 21* >90 mL/min Final   Comment: (NOTE) The eGFR has been calculated using the CKD EPI equation. This calculation has not been validated in all clinical situations. eGFR's persistently <90 mL/min signify possible Chronic Kidney Disease.   . Anion gap 10/31/2014 7  5 - 15 Final  . Troponin i, poc 10/31/2014 0.02  0.00 - 0.08 ng/mL Final  . Comment 3 10/31/2014          Final   Comment: Due to the release kinetics of cTnI, a negative result within the first hours of the onset of symptoms does not rule out myocardial infarction with certainty. If myocardial infarction is still suspected, repeat the test at appropriate intervals.   . Opiates 10/31/2014 NONE DETECTED  NONE DETECTED Final  . Cocaine 10/31/2014 NONE DETECTED  NONE DETECTED Final  . Benzodiazepines 10/31/2014 NONE DETECTED  NONE DETECTED Final  . Amphetamines 10/31/2014 NONE DETECTED  NONE DETECTED Final  . Tetrahydrocannabinol 10/31/2014 NONE DETECTED  NONE DETECTED Final  . Barbiturates 10/31/2014 NONE DETECTED  NONE DETECTED Final   Comment:        DRUG SCREEN FOR MEDICAL PURPOSES ONLY.  IF CONFIRMATION IS NEEDED FOR ANY PURPOSE, NOTIFY LAB WITHIN 5 DAYS.        LOWEST DETECTABLE LIMITS FOR URINE DRUG SCREEN Drug Class       Cutoff (ng/mL) Amphetamine      1000 Barbiturate      200 Benzodiazepine   878 Tricyclics       676 Opiates          300 Cocaine          300 THC              50   . Color, Urine 10/31/2014 YELLOW  YELLOW Final  . APPearance 10/31/2014 CLEAR  CLEAR Final  . Specific Gravity, Urine 10/31/2014 1.014  1.005 - 1.030 Final  . pH 10/31/2014 5.0  5.0 - 8.0 Final  . Glucose, UA 10/31/2014 NEGATIVE  NEGATIVE mg/dL Final  . Hgb urine dipstick 10/31/2014 SMALL* NEGATIVE Final  . Bilirubin Urine 10/31/2014 NEGATIVE   NEGATIVE Final  . Ketones, ur 10/31/2014 NEGATIVE  NEGATIVE mg/dL Final  . Protein, ur 10/31/2014 NEGATIVE  NEGATIVE mg/dL Final  . Urobilinogen, UA 10/31/2014 0.2  0.0 - 1.0 mg/dL Final  . Nitrite 10/31/2014 NEGATIVE  NEGATIVE Final  . Leukocytes, UA 10/31/2014 MODERATE* NEGATIVE Final  . Squamous Epithelial / LPF 10/31/2014 RARE  RARE Final  . WBC, UA 10/31/2014 3-6  <3 WBC/hpf Final  . RBC / HPF 10/31/2014 0-2  <3 RBC/hpf Final  . Bacteria, UA 10/31/2014 RARE  RARE Final  . Casts 10/31/2014 GRANULAR CAST* NEGATIVE Final  . Hgb A1c MFr Bld 10/31/2014 5.9* 4.8 - 5.6 % Final   Comment: (NOTE)         Pre-diabetes: 5.7 - 6.4         Diabetes: >6.4         Glycemic control for adults with diabetes: <7.0   . Mean Plasma Glucose 10/31/2014 123   Final   Comment: (NOTE) Performed At: [facility] [place], [place] [id number] Lindon Romp MD [id number]   . Cholesterol 11/01/2014 207* 0 - 200 mg/dL Final  . Triglycerides  11/01/2014 128  <150 mg/dL Final  . HDL 11/01/2014 45  >39 mg/dL Final  . Total CHOL/HDL Ratio 11/01/2014 4.6   Final  . VLDL 11/01/2014 26  0 - 40 mg/dL Final  . LDL Cholesterol 11/01/2014 136* 0 - 99 mg/dL Final   Comment:        Total Cholesterol/HDL:CHD Risk Coronary Heart Disease Risk Table                     Men   Women  1/2 Average Risk   3.4   3.3  Average Risk       5.0   4.4  2 X Average Risk   9.6   7.1  3 X Average Risk  23.4   11.0        Use the calculated Patient Ratio above and the CHD Risk Table to determine the patient's CHD Risk.        ATP III CLASSIFICATION (LDL):  <100     mg/dL   Optimal  100-129  mg/dL   Near or Above                    Optimal  130-159  mg/dL   Borderline  160-189  mg/dL   High  >190     mg/dL   Very High   . Opiates 11/01/2014 NONE DETECTED  NONE DETECTED Final  . Cocaine 11/01/2014 NONE DETECTED  NONE DETECTED Final  . Benzodiazepines 11/01/2014 NONE DETECTED  NONE DETECTED Final    . Amphetamines 11/01/2014 NONE DETECTED  NONE DETECTED Final  . Tetrahydrocannabinol 11/01/2014 NONE DETECTED  NONE DETECTED Final  . Barbiturates 11/01/2014 NONE DETECTED  NONE DETECTED Final   Comment:        DRUG SCREEN FOR MEDICAL PURPOSES ONLY.  IF CONFIRMATION IS NEEDED FOR ANY PURPOSE, NOTIFY LAB WITHIN 5 DAYS.        LOWEST DETECTABLE LIMITS FOR URINE DRUG SCREEN Drug Class       Cutoff (ng/mL) Amphetamine      1000 Barbiturate      200 Benzodiazepine   938 Tricyclics       182 Opiates          300 Cocaine          300 THC              50   . TSH 11/01/2014 0.522  0.350 - 4.500 uIU/mL Final  . Total CK 11/01/2014 233* 7 - 177 U/L Final  . WBC 11/01/2014 9.6  4.0 - 10.5 K/uL Final  . RBC 11/01/2014 4.71  3.87 - 5.11 MIL/uL Final  . Hemoglobin 11/01/2014 14.8  12.0 - 15.0 g/dL Final  . HCT 11/01/2014 43.0  36.0 - 46.0 % Final  . MCV 11/01/2014 91.3  78.0 - 100.0 fL Final  . MCH 11/01/2014 31.4  26.0 - 34.0 pg Final  . MCHC 11/01/2014 34.4  30.0 - 36.0 g/dL Final  . RDW 11/01/2014 13.6  11.5 - 15.5 % Final  . Platelets 11/01/2014 284  150 - 400 K/uL Final  . Sodium 11/01/2014 138  135 - 145 mmol/L Final  . Potassium 11/01/2014 4.1  3.5 - 5.1 mmol/L Final   DELTA CHECK NOTED  . Chloride 11/01/2014 104  96 - 112 mmol/L Final  . CO2 11/01/2014 26  19 - 32 mmol/L Final  . Glucose, Bld 11/01/2014 103* 70 - 99 mg/dL Final  . BUN 11/01/2014 21  6 -  23 mg/dL Final  . Creatinine, Ser 11/01/2014 1.79* 0.50 - 1.10 mg/dL Final  . Calcium 11/01/2014 9.0  8.4 - 10.5 mg/dL Final  . GFR calc non Af Amer 11/01/2014 25* >90 mL/min Final  . GFR calc Af Amer 11/01/2014 28* >90 mL/min Final   Comment: (NOTE) The eGFR has been calculated using the CKD EPI equation. This calculation has not been validated in all clinical situations. eGFR's persistently <90 mL/min signify possible Chronic Kidney Disease.   . Anion gap 11/01/2014 8  5 - 15 Final  . Glucose-Capillary 11/01/2014 95  70 -  99 mg/dL Final     Assessment/Plan    ICD-9-CM ICD-10-CM   1. Other seasonal allergic rhinitis 477.8 J30.2   2. Dementia, without behavioral disturbance - stable on no med 294.20 F03.90    --Rx claritin 10 mg daily  --continue all other medications as ordered  --will follow  Kristie Gonzales S. Perlie Gold  [facility] [address] [place], [place] [address] [phone] Office (Wednesdays and Fridays 8 AM - 5 PM) [phone] Cell (Monday-Friday 8 AM - 5 PM)

## 2014-12-03 ENCOUNTER — Non-Acute Institutional Stay (SKILLED_NURSING_FACILITY): Payer: Medicare HMO | Admitting: Adult Health

## 2014-12-03 ENCOUNTER — Encounter: Payer: Self-pay | Admitting: Adult Health

## 2014-12-03 DIAGNOSIS — G459 Transient cerebral ischemic attack, unspecified: Secondary | ICD-10-CM

## 2014-12-03 DIAGNOSIS — J302 Other seasonal allergic rhinitis: Secondary | ICD-10-CM | POA: Diagnosis not present

## 2014-12-03 DIAGNOSIS — I10 Essential (primary) hypertension: Secondary | ICD-10-CM | POA: Diagnosis not present

## 2014-12-03 DIAGNOSIS — E038 Other specified hypothyroidism: Secondary | ICD-10-CM

## 2014-12-03 DIAGNOSIS — F0390 Unspecified dementia without behavioral disturbance: Secondary | ICD-10-CM | POA: Diagnosis not present

## 2014-12-03 DIAGNOSIS — E034 Atrophy of thyroid (acquired): Secondary | ICD-10-CM | POA: Diagnosis not present

## 2014-12-03 DIAGNOSIS — N183 Chronic kidney disease, stage 3 (moderate): Secondary | ICD-10-CM | POA: Diagnosis not present

## 2014-12-03 NOTE — Progress Notes (Signed)
Patient ID: Kristie Gonzales, female   DOB: 05/30/1926, 81 y.o.   MRN: [id number]  [facility]     Allergies  Allergen Reactions  . Sulfonamide Derivatives Other (See Comments)    Unknown allergic reaction       Chief Complaint  Patient presents with  . Medical Management of Chronic Issues    HPI:  She is a long term resident of this facility being seen for the management of her chronic illnesses. Overall her status is without significant change. She is unable to fully participate in the hpi or ros; but states that she is feeling good. There are no nursing concerns at this time.    Past Medical History  Diagnosis Date  . Thyroid disease   . Hypertension   . Pneumonia 04/19/12  . Hypothyroidism     Past Surgical History  Procedure Laterality Date  . Abdominal hysterectomy    . Appendectomy      VITAL SIGNS BP 140/70 mmHg  Pulse 72  Ht 5\' 2"  (1.575 m)  Wt 174 lb (78.926 kg)  BMI 31.82 kg/m2  SpO2 97%   Outpatient Encounter Prescriptions as of 12/03/2014  Medication Sig  . aspirin 325 MG tablet Take 1 tablet (325 mg total) by mouth daily.  Marland Kitchen. atorvastatin (LIPITOR) 40 MG tablet Take 1 tablet (40 mg total) by mouth daily at 6 PM.  . levothyroxine (SYNTHROID, LEVOTHROID) 100 MCG tablet Take 100 mcg by mouth daily.  Marland Kitchen. loratadine (CLARITIN) 10 MG tablet Take 10 mg by mouth daily.  Marland Kitchen. Propylene Glycol 0.6 % SOLN Apply 1 drop to eye 3 (three) times daily. To both eyes     SIGNIFICANT DIAGNOSTIC EXAMS  10-31-14: ct of head and cervical spine: 1. No acute intracranial pathology. 2. No acute osseous injury of the cervical spine.  10-31-14: right elbow x-ray: Small spur arising from the medial distal humeral condyle. No fracture or dislocation. No appreciable joint effusion.  11-01-14; mri/mra of head: MRI HEAD: No acute intracranial process, specifically no evidence of acute ischemia. Involutional changes. Moderate white matter changes can be seen with chronic small vessel  ischemic disease. Tiny stable T2 hyperintensities in the basal ganglia suggest perivascular spaces and/or lacunar infarcts. MRA HEAD: No high-grade stenosis or large vessel occlusion. Mild luminal irregularity of the anterior and posterior circulation most consistent with atherosclerosis.  11-02-14: 2-d echo: Left ventricle: The cavity size was normal. Wall thickness was normal. Systolic function was vigorous. The estimated ejection fraction was in the range of 65% to 70%. Wall motion was normal. there were no regional wall motion abnormalities. Features are consistent with a pseudonormal left ventricular filling pattern, with concomitant abnormal relaxation and increased filling pressure (grade 2 diastolic dysfunction).  11-02-14: carotid doppler: Bilateral: mild mixed plaque CCA and origin ICA. 1-39% ICA stenosis. Vertebral artery flow is antegrade.    LABS REVIEWED:   10-31-14: wbc 11.7; hgb 14.9; hct 43.1; mcv 90.7 plt2 76; glucose 138; bun 27; creat 2.28; k+3.4; na++142; liver normal albumin 3.9; hgb a1c 5.9 11-01-14: wbc 9.6; hgb 14.8; hct 43.0; mcv 91.3; plt 284; glucose 103; bun 2;1 creat 1.79; k+4.1 ;na++138; ck 233; chol 207; ldl 136; trig 128; hdl 45; tsh 0.522     Review of Systems  Unable to perform ROS    Physical Exam  Constitutional: She appears well-developed and well-nourished. No distress.  Neck: Neck supple. No JVD present. No thyromegaly present.  Cardiovascular: Normal rate, regular rhythm and intact distal pulses.   Respiratory: No  respiratory distress. She has no wheezes.  GI: Soft. Bowel sounds are normal. She exhibits no distension. There is no tenderness.  Musculoskeletal: She exhibits no edema.  Is able to move all extremities   Neurological: She is alert.  Skin: Skin is warm and dry. She is not diaphoretic.       ASSESSMENT/ PLAN:  1.  Hypothyroidism: will continue synthroid 100 mcg daily her tsh is 0.522 will monitor   2. Dyslipidemia: will continue  lipitor 40 mg daily her ldl is 136 will monitor   3. TIA: she is neurologically stable; will continue asa 325 mg daily will monitor   4. Allergic rhinitis: is stable will continue claritin 10 mg daily   5. Dementia: no change in her status; her current wieght is 179 pounds; is presently not taking medications; will not make changes will monitor her status.   6. Hypertension: she is presently stable; is presently not on medications; will not make changes and will monitor her status.   7. Chronic kidney disease: her creat is 1.79; will continue to monitor       Synthia Innocent NP [facility]  Contact [phone] Monday through Friday 8am- 5pm  After hours call [phone]

## 2014-12-29 DIAGNOSIS — M109 Gout, unspecified: Secondary | ICD-10-CM | POA: Insufficient documentation

## 2014-12-29 NOTE — Addendum Note (Signed)
Addended by: Kirt BoysARTER, Jarquavious Fentress on: 12/29/2014 10:00 PM   Modules accepted: Level of Service

## 2015-01-05 ENCOUNTER — Non-Acute Institutional Stay (SKILLED_NURSING_FACILITY): Payer: Medicare HMO | Admitting: Adult Health

## 2015-01-05 DIAGNOSIS — N183 Chronic kidney disease, stage 3 (moderate): Secondary | ICD-10-CM | POA: Diagnosis not present

## 2015-01-05 DIAGNOSIS — J302 Other seasonal allergic rhinitis: Secondary | ICD-10-CM | POA: Diagnosis not present

## 2015-01-05 DIAGNOSIS — E034 Atrophy of thyroid (acquired): Secondary | ICD-10-CM | POA: Diagnosis not present

## 2015-01-05 DIAGNOSIS — F0390 Unspecified dementia without behavioral disturbance: Secondary | ICD-10-CM

## 2015-01-05 DIAGNOSIS — G459 Transient cerebral ischemic attack, unspecified: Secondary | ICD-10-CM

## 2015-01-05 DIAGNOSIS — I10 Essential (primary) hypertension: Secondary | ICD-10-CM

## 2015-01-05 DIAGNOSIS — E038 Other specified hypothyroidism: Secondary | ICD-10-CM

## 2015-01-06 ENCOUNTER — Ambulatory Visit: Payer: Managed Care, Other (non HMO) | Admitting: Neurology

## 2015-01-29 ENCOUNTER — Encounter: Payer: Self-pay | Admitting: *Deleted

## 2015-02-03 ENCOUNTER — Non-Acute Institutional Stay (SKILLED_NURSING_FACILITY): Payer: Medicare HMO | Admitting: Adult Health

## 2015-02-03 DIAGNOSIS — F0390 Unspecified dementia without behavioral disturbance: Secondary | ICD-10-CM

## 2015-02-03 DIAGNOSIS — G459 Transient cerebral ischemic attack, unspecified: Secondary | ICD-10-CM | POA: Diagnosis not present

## 2015-02-03 DIAGNOSIS — E034 Atrophy of thyroid (acquired): Secondary | ICD-10-CM

## 2015-02-03 DIAGNOSIS — N183 Chronic kidney disease, stage 3 (moderate): Secondary | ICD-10-CM | POA: Diagnosis not present

## 2015-02-03 DIAGNOSIS — J302 Other seasonal allergic rhinitis: Secondary | ICD-10-CM

## 2015-02-03 DIAGNOSIS — I10 Essential (primary) hypertension: Secondary | ICD-10-CM

## 2015-02-03 DIAGNOSIS — E038 Other specified hypothyroidism: Secondary | ICD-10-CM

## 2015-02-25 ENCOUNTER — Encounter: Payer: Self-pay | Admitting: Adult Health

## 2015-02-25 NOTE — Progress Notes (Signed)
Patient ID: Kristie MannsLillie K Albrecht, female   DOB: 03/12/1926, 81 y.o.   MRN: [id number]  [facility]     Allergies  Allergen Reactions  . Sulfonamide Derivatives Other (See Comments)    Unknown allergic reaction       Chief Complaint  Patient presents with  . Medical Management of Chronic Issues    HPI:  She is a long term resident of this facility being seen for the management of her chronic illnesses. Overall her status remains without change. She is unable to fully participate in the hpi or ros. There are no nursing concerns today.    Past Medical History  Diagnosis Date  . Thyroid disease   . Hypertension   . Pneumonia 04/19/12  . Hypothyroidism     Past Surgical History  Procedure Laterality Date  . Abdominal hysterectomy    . Appendectomy      VITAL SIGNS BP 130/78 mmHg  Pulse 70  Ht 5\' 2"  (1.575 m)  Wt 174 lb (78.926 kg)  BMI 31.82 kg/m2  SpO2 97%   Outpatient Encounter Prescriptions as of 01/05/2015  Medication Sig  . aspirin 325 MG tablet Take 1 tablet (325 mg total) by mouth daily.  Marland Kitchen. atorvastatin (LIPITOR) 40 MG tablet Take 1 tablet (40 mg total) by mouth daily at 6 PM.  . levothyroxine (SYNTHROID, LEVOTHROID) 100 MCG tablet Take 100 mcg by mouth daily.  Marland Kitchen. loratadine (CLARITIN) 10 MG tablet Take 10 mg by mouth daily.  Marland Kitchen. Propylene Glycol 0.6 % SOLN Apply 1 drop to eye 3 (three) times daily. To both eyes      SIGNIFICANT DIAGNOSTIC EXAMS   10-31-14: ct of head and cervical spine: 1. No acute intracranial pathology. 2. No acute osseous injury of the cervical spine.  10-31-14: right elbow x-ray: Small spur arising from the medial distal humeral condyle. No fracture or dislocation. No appreciable joint effusion.  11-01-14; mri/mra of head: MRI HEAD: No acute intracranial process, specifically no evidence of acute ischemia. Involutional changes. Moderate white matter changes can be seen with chronic small vessel ischemic disease. Tiny stable T2  hyperintensities in the basal ganglia suggest perivascular spaces and/or lacunar infarcts. MRA HEAD: No high-grade stenosis or large vessel occlusion. Mild luminal irregularity of the anterior and posterior circulation most consistent with atherosclerosis.  11-02-14: 2-d echo: Left ventricle: The cavity size was normal. Wall thickness was normal. Systolic function was vigorous. The estimated ejection fraction was in the range of 65% to 70%. Wall motion was normal. there were no regional wall motion abnormalities. Features are consistent with a pseudonormal left ventricular filling pattern, with concomitant abnormal relaxation and increased filling pressure (grade 2 diastolic dysfunction).  11-02-14: carotid doppler: Bilateral: mild mixed plaque CCA and origin ICA. 1-39% ICA stenosis. Vertebral artery flow is antegrade.    LABS REVIEWED:   10-31-14: wbc 11.7; hgb 14.9; hct 43.1; mcv 90.7 plt2 76; glucose 138; bun 27; creat 2.28; k+3.4; na++142; liver normal albumin 3.9; hgb a1c 5.9 11-01-14: wbc 9.6; hgb 14.8; hct 43.0; mcv 91.3; plt 284; glucose 103; bun 2;1 creat 1.79; k+4.1 ;na++138; ck 233; chol 207; ldl 136; trig 128; hdl 45; tsh 0.522    ROS Unable to perform ROS   Physical Exam Constitutional: She appears well-developed and well-nourished. No distress.  Neck: Neck supple. No JVD present. No thyromegaly present.  Cardiovascular: Normal rate, regular rhythm and intact distal pulses.   Respiratory: No respiratory distress. She has no wheezes.  GI: Soft. Bowel sounds are normal. She  exhibits no distension. There is no tenderness.  Musculoskeletal: She exhibits no edema.  Is able to move all extremities   Neurological: She is alert.  Skin: Skin is warm and dry. She is not diaphoretic.     ASSESSMENT/ PLAN:   1.  Hypothyroidism: will continue synthroid 100 mcg daily her tsh is 0.522 will monitor   2. Dyslipidemia: will continue lipitor 40 mg daily her ldl is 136 will monitor   3.  TIA: she is neurologically stable; will continue asa 325 mg daily will monitor   4. Allergic rhinitis: is stable will continue claritin 10 mg daily   5. Dementia: no change in her status; her current wieght is 174 pounds; is presently not taking medications; will not make changes will monitor her status.   6. Hypertension: she is presently stable; is presently not on medications; will not make changes and will monitor her status.   7. Chronic kidney disease: her creat is 1.79; will continue to monitor     Synthia Innocent NP [facility]  Contact [phone] Monday through Friday 8am- 5pm  After hours call [phone]

## 2015-03-09 ENCOUNTER — Encounter: Payer: Self-pay | Admitting: Adult Health

## 2015-03-09 NOTE — Progress Notes (Signed)
Patient ID: Kristie Gonzales, female   DOB: 01-26-1926, 81 y.o.   MRN: [id number]  [facility]     Allergies  Allergen Reactions  . Sulfonamide Derivatives Other (See Comments)    Unknown allergic reaction       Chief Complaint  Patient presents with  . Medical Management of Chronic Issues    HPI:  She is a long term resident of this facility being seen for the management of her chronic illnesses. Overall there is little change in her status. She is unable to participate in the hpi or ros. There are no nursing concerns being voiced at this time.    Past Medical History  Diagnosis Date  . Thyroid disease   . Hypertension   . Pneumonia 04/19/12  . Hypothyroidism     Past Surgical History  Procedure Laterality Date  . Abdominal hysterectomy    . Appendectomy      VITAL SIGNS BP 142/72 mmHg  Pulse 72  Ht  (1.575 m)  Wt 178 lb (80.74 kg)  BMI 32.55 kg/m2  SpO2 98%   Outpatient Encounter Prescriptions as of 02/03/2015  Medication Sig  . aspirin 325 MG tablet Take 1 tablet (325 mg total) by mouth daily.  Marland Kitchen atorvastatin (LIPITOR) 40 MG tablet Take 1 tablet (40 mg total) by mouth daily at 6 PM.  . levothyroxine (SYNTHROID, LEVOTHROID) 100 MCG tablet Take 100 mcg by mouth daily.  Marland Kitchen loratadine (CLARITIN) 10 MG tablet Take 10 mg by mouth daily.  Marland Kitchen Propylene Glycol 0.6 % SOLN Apply 1 drop to eye 3 (three) times daily. To both eyes      SIGNIFICANT DIAGNOSTIC EXAMS  10-31-14: ct of head and cervical spine: 1. No acute intracranial pathology. 2. No acute osseous injury of the cervical spine.  10-31-14: right elbow x-ray: Small spur arising from the medial distal humeral condyle. No fracture or dislocation. No appreciable joint effusion.  11-01-14; mri/mra of head: MRI HEAD: No acute intracranial process, specifically no evidence of acute ischemia. Involutional changes. Moderate white matter changes can be seen with chronic small vessel ischemic disease. Tiny stable T2  hyperintensities in the basal ganglia suggest perivascular spaces and/or lacunar infarcts. MRA HEAD: No high-grade stenosis or large vessel occlusion. Mild luminal irregularity of the anterior and posterior circulation most consistent with atherosclerosis.  11-02-14: 2-d echo: Left ventricle: The cavity size was normal. Wall thickness was normal. Systolic function was vigorous. The estimated ejection fraction was in the range of 65% to 70%. Wall motion was normal. there were no regional wall motion abnormalities. Features are consistent with a pseudonormal left ventricular filling pattern, with concomitant abnormal relaxation and increased filling pressure (grade 2 diastolic dysfunction).  11-02-14: carotid doppler: Bilateral: mild mixed plaque CCA and origin ICA. 1-39% ICA stenosis. Vertebral artery flow is antegrade.    LABS REVIEWED:   10-31-14: wbc 11.7; hgb 14.9; hct 43.1; mcv 90.7 plt2 76; glucose 138; bun 27; creat 2.28; k+3.4; na++142; liver normal albumin 3.9; hgb a1c 5.9 11-01-14: wbc 9.6; hgb 14.8; hct 43.0; mcv 91.3; plt 284; glucose 103; bun 2;1 creat 1.79; k+4.1 ;na++138; ck 233; chol 207; ldl 136; trig 128; hdl 45; tsh 0.522      ROS Unable to perform ROS    Physical Exam Constitutional: She appears well-developed and well-nourished. No distress.  Neck: Neck supple. No JVD present. No thyromegaly present.  Cardiovascular: Normal rate, regular rhythm and intact distal pulses.   Respiratory: No respiratory distress. She has no wheezes.  GI: Soft. Bowel sounds are normal. She exhibits no distension. There is no tenderness.  Musculoskeletal: She exhibits no edema.  Is able to move all extremities   Neurological: She is alert.  Skin: Skin is warm and dry. She is not diaphoretic.        ASSESSMENT/ PLAN:  1.  Hypothyroidism: will continue synthroid 100 mcg daily her tsh is 0.522 will monitor   2. Dyslipidemia: will continue lipitor 40 mg daily her ldl is 136 will monitor    3. TIA: she is neurologically stable; will continue asa 325 mg daily will monitor   4. Allergic rhinitis: is stable will continue claritin 10 mg daily   5. Dementia: no change in her status; her current wieght is 174 pounds; is presently not taking medications; will not make changes will monitor her status.   6. Hypertension: she is presently stable; is presently not on medications; will not make changes and will monitor her status.   7. Chronic kidney disease: her creat is 1.79; will continue to monitor        Synthia Innocenteborah Keston Seever NP [facility]  Contact [phone] Monday through Friday 8am- 5pm  After hours call [phone]

## 2015-03-11 ENCOUNTER — Non-Acute Institutional Stay (SKILLED_NURSING_FACILITY): Payer: Medicare HMO | Admitting: Adult Health

## 2015-03-11 DIAGNOSIS — I10 Essential (primary) hypertension: Secondary | ICD-10-CM

## 2015-03-11 DIAGNOSIS — G459 Transient cerebral ischemic attack, unspecified: Secondary | ICD-10-CM

## 2015-03-11 DIAGNOSIS — N183 Chronic kidney disease, stage 3 (moderate): Secondary | ICD-10-CM | POA: Diagnosis not present

## 2015-03-11 DIAGNOSIS — E034 Atrophy of thyroid (acquired): Secondary | ICD-10-CM | POA: Diagnosis not present

## 2015-03-11 DIAGNOSIS — F0390 Unspecified dementia without behavioral disturbance: Secondary | ICD-10-CM | POA: Diagnosis not present

## 2015-03-11 DIAGNOSIS — E038 Other specified hypothyroidism: Secondary | ICD-10-CM | POA: Diagnosis not present

## 2015-04-08 ENCOUNTER — Non-Acute Institutional Stay (SKILLED_NURSING_FACILITY): Payer: Medicare HMO | Admitting: Internal Medicine

## 2015-04-08 DIAGNOSIS — M545 Low back pain: Secondary | ICD-10-CM

## 2015-04-08 DIAGNOSIS — F0390 Unspecified dementia without behavioral disturbance: Secondary | ICD-10-CM

## 2015-04-08 DIAGNOSIS — G459 Transient cerebral ischemic attack, unspecified: Secondary | ICD-10-CM

## 2015-04-08 DIAGNOSIS — E038 Other specified hypothyroidism: Secondary | ICD-10-CM | POA: Diagnosis not present

## 2015-04-08 DIAGNOSIS — I10 Essential (primary) hypertension: Secondary | ICD-10-CM | POA: Diagnosis not present

## 2015-04-08 DIAGNOSIS — E034 Atrophy of thyroid (acquired): Secondary | ICD-10-CM

## 2015-04-08 DIAGNOSIS — N183 Chronic kidney disease, stage 3 (moderate): Secondary | ICD-10-CM

## 2015-04-13 NOTE — Progress Notes (Signed)
Patient ID: Kristie Gonzales, female   DOB: July 03, 1926, 81 y.o.   MRN: [id number]  [facility]     Allergies  Allergen Reactions  . Sulfonamide Derivatives Other (See Comments)    Unknown allergic reaction       Chief Complaint  Patient presents with  . Medical Management of Chronic Issues    HPI:  She is a long term resident of this facility being seen for the management of her chronic illnesses. There is little change in her overall status. She is unable to fully participate in the hpi or ros. There are no nursing concerns at this time.    Past Medical History  Diagnosis Date  . Thyroid disease   . Hypertension   . Pneumonia 04/19/12  . Hypothyroidism     Past Surgical History  Procedure Laterality Date  . Abdominal hysterectomy    . Appendectomy      VITAL SIGNS BP 118/76 mmHg  Pulse 60  Ht  (1.575 m)  Wt 178 lb (80.74 kg)  BMI 32.55 kg/m2  SpO2 97%   Outpatient Encounter Prescriptions as of 03/11/2015  Medication Sig  . aspirin 325 MG tablet Take 1 tablet (325 mg total) by mouth daily.  Marland Kitchen atorvastatin (LIPITOR) 40 MG tablet Take 1 tablet (40 mg total) by mouth daily at 6 PM.  . levothyroxine (SYNTHROID, LEVOTHROID) 100 MCG tablet Take 100 mcg by mouth daily.  Marland Kitchen loratadine (CLARITIN) 10 MG tablet Take 10 mg by mouth daily.  Marland Kitchen Propylene Glycol 0.6 % SOLN Apply 1 drop to eye 3 (three) times daily. To both eyes      SIGNIFICANT DIAGNOSTIC EXAMS  10-31-14: ct of head and cervical spine: 1. No acute intracranial pathology. 2. No acute osseous injury of the cervical spine.  10-31-14: right elbow x-ray: Small spur arising from the medial distal humeral condyle. No fracture or dislocation. No appreciable joint effusion.  11-01-14; mri/mra of head: MRI HEAD: No acute intracranial process, specifically no evidence of acute ischemia. Involutional changes. Moderate white matter changes can be seen with chronic small vessel ischemic disease. Tiny stable T2  hyperintensities in the basal ganglia suggest perivascular spaces and/or lacunar infarcts. MRA HEAD: No high-grade stenosis or large vessel occlusion. Mild luminal irregularity of the anterior and posterior circulation most consistent with atherosclerosis.  11-02-14: 2-d echo: Left ventricle: The cavity size was normal. Wall thickness was normal. Systolic function was vigorous. The estimated ejection fraction was in the range of 65% to 70%. Wall motion was normal. there were no regional wall motion abnormalities. Features are consistent with a pseudonormal left ventricular filling pattern, with concomitant abnormal relaxation and increased filling pressure (grade 2 diastolic dysfunction).  11-02-14: carotid doppler: Bilateral: mild mixed plaque CCA and origin ICA. 1-39% ICA stenosis. Vertebral artery flow is antegrade.    LABS REVIEWED:   10-31-14: wbc 11.7; hgb 14.9; hct 43.1; mcv 90.7 plt2 76; glucose 138; bun 27; creat 2.28; k+3.4; na++142; liver normal albumin 3.9; hgb a1c 5.9 11-01-14: wbc 9.6; hgb 14.8; hct 43.0; mcv 91.3; plt 284; glucose 103; bun 2;1 creat 1.79; k+4.1 ;na++138; ck 233; chol 207; ldl 136; trig 128; hdl 45; tsh 0.522       Review of Systems  Unable to perform ROS: dementia     Physical Exam Constitutional: She appears well-developed and well-nourished. No distress.  Neck: Neck supple. No JVD present. No thyromegaly present.  Cardiovascular: Normal rate, regular rhythm and intact distal pulses.   Respiratory: No respiratory distress.  She has no wheezes.  GI: Soft. Bowel sounds are normal. She exhibits no distension. There is no tenderness.  Musculoskeletal: She exhibits no edema.  Is able to move all extremities   Neurological: She is alert.  Skin: Skin is warm and dry. She is not diaphoretic    ASSESSMENT/ PLAN:  1.  Hypothyroidism: will continue synthroid 100 mcg daily her tsh is 0.522 will monitor   2. Dyslipidemia: will continue lipitor 40 mg daily her ldl  is 136 will monitor   3. TIA: she is neurologically stable; will continue asa 325 mg daily will monitor   4. Allergic rhinitis: is stable will continue claritin 10 mg daily   5. Dementia: no change in her status; her current wieght is 178 pounds; is presently not taking medications; will not make changes will monitor her status.   6. Hypertension: she is presently stable; is presently not on medications; will not make changes and will monitor her status.   7. Chronic kidney disease: her creat is 1.79; will continue to monitor    In July is due for cbc; cmp; lipids; hgb a1c and tsh     Synthia Innocenteborah Zarria Towell NP [facility]  Contact [phone] Monday through Friday 8am- 5pm  After hours call [phone]

## 2015-04-15 ENCOUNTER — Ambulatory Visit: Payer: Medicare HMO | Admitting: Neurology

## 2015-04-15 ENCOUNTER — Non-Acute Institutional Stay (SKILLED_NURSING_FACILITY): Payer: Medicare HMO | Admitting: Adult Health

## 2015-04-15 DIAGNOSIS — F0150 Vascular dementia without behavioral disturbance: Secondary | ICD-10-CM

## 2015-04-15 DIAGNOSIS — F0280 Dementia in other diseases classified elsewhere without behavioral disturbance: Secondary | ICD-10-CM | POA: Diagnosis not present

## 2015-04-15 DIAGNOSIS — F329 Major depressive disorder, single episode, unspecified: Secondary | ICD-10-CM

## 2015-04-15 DIAGNOSIS — F0393 Unspecified dementia, unspecified severity, with mood disturbance: Secondary | ICD-10-CM

## 2015-04-15 NOTE — Progress Notes (Signed)
Patient ID: Kristie Gonzales, female   DOB: 09-18-1926, 81 y.o.   MRN: [id number]    DATE: 04/08/15  Location:  [facility]    Place of Service: [phone])   Extended Emergency Contact Information Primary Emergency Contact: [place] Phone: [phone] Relation: Daughter  Advanced Directive information   DNR; MOST FORM - NO FT  Chief Complaint  Patient presents with  . Medical Management of Chronic Issues    HPI:  81 yo female long term resident seen today for f/u. She c/o low back pain since fall last week. No numbness/tingling. No nursing issues. Sleeping well. Appetite ok. She is a poor historian due to dementia. Hx obtained from chart.  HTN - BP diet controlled  Dementia - stable without meds  Thyroid - stable on levothyroxine  Hx TIA - takes ASA daily and statin for cholesterol  CKD - renal fxn stable  Seasonal allergy - stable on loratidine  Past Medical History  Diagnosis Date  . Thyroid disease   . Hypertension   . Pneumonia 04/19/12  . Hypothyroidism     Past Surgical History  Procedure Laterality Date  . Abdominal hysterectomy    . Appendectomy      Patient Care Team: Gildardo Cranker, DO as PCP - General (Internal Medicine) Gerlene Fee, NP as Nurse Practitioner (Nurse Practitioner)  History   Social History  . Marital Status: Widowed    Spouse Name: N/A  . Number of Children: N/A  . Years of Education: N/A   Occupational History  . Not on file.   Social History Main Topics  . Smoking status: Never Smoker   . Smokeless tobacco: Not on file  . Alcohol Use: No  . Drug Use: No  . Sexual Activity: Not on file   Other Topics Concern  . Not on file   Social History Narrative     reports that she has never smoked. She does not have any smokeless tobacco history on file. She reports that she does not drink alcohol or use illicit drugs.  Immunization History  Administered Date(s)  Administered  . PPD Test 11/03/2014    Allergies  Allergen Reactions  . Sulfonamide Derivatives Other (See Comments)    Unknown allergic reaction    Medications: Patient's Medications  New Prescriptions   No medications on file  Previous Medications   ASPIRIN 325 MG TABLET    Take 1 tablet (325 mg total) by mouth daily.   ATORVASTATIN (LIPITOR) 40 MG TABLET    Take 1 tablet (40 mg total) by mouth daily at 6 PM.   LEVOTHYROXINE (SYNTHROID, LEVOTHROID) 100 MCG TABLET    Take 100 mcg by mouth daily.   LORATADINE (CLARITIN) 10 MG TABLET    Take 10 mg by mouth daily.   PROPYLENE GLYCOL 0.6 % SOLN    Apply 1 drop to eye 3 (three) times daily. To both eyes  Modified Medications   No medications on file  Discontinued Medications   No medications on file    Review of Systems  Unable to perform ROS: Dementia    There were no vitals filed for this visit. There is no weight on file to calculate BMI.  Physical Exam  Constitutional: She appears well-developed.  Sitting in w/c in NAD. Frail appearing  HENT:  Mouth/Throat: Oropharynx is clear and moist. No oropharyngeal exudate.  Eyes: Pupils are equal, round, and reactive to light. No scleral icterus.  Neck: Neck  supple. Carotid bruit is not present. No tracheal deviation present. No thyromegaly present.  Cardiovascular: Normal rate, regular rhythm and intact distal pulses.  Exam reveals no gallop and no friction rub.   Murmur (1/6 SEM) heard. Trace LE edema b/l. No calf TTP  Pulmonary/Chest: Effort normal and breath sounds normal. No stridor. No respiratory distress. She has no wheezes. She has no rales.  Abdominal: Soft. Bowel sounds are normal. She exhibits no distension and no mass. There is no hepatomegaly. There is no tenderness. There is no rebound and no guarding.  Musculoskeletal: She exhibits edema and tenderness.       Back:  Lymphadenopathy:    She has no cervical adenopathy.  Neurological: She is alert.  Skin: Skin is  warm and dry. No rash noted.  Psychiatric: She has a normal mood and affect. Her behavior is normal.     Labs reviewed: No visits with results within 3 Month(s) from this visit. Latest known visit with results is:  Admission on 10/31/2014, Discharged on 11/03/2014  Component Date Value Ref Range Status  . Alcohol, Ethyl (B) 10/31/2014 <5  0 - 9 mg/dL Final   Comment:        LOWEST DETECTABLE LIMIT FOR SERUM ALCOHOL IS 11 mg/dL FOR MEDICAL PURPOSES ONLY   . Sodium 10/31/2014 142  135 - 145 mmol/L Final  . Potassium 10/31/2014 3.4* 3.5 - 5.1 mmol/L Final  . Chloride 10/31/2014 102  96 - 112 mmol/L Final  . BUN 10/31/2014 30* 6 - 23 mg/dL Final  . Creatinine, Ser 10/31/2014 2.10* 0.50 - 1.10 mg/dL Final  . Glucose, Bld 10/31/2014 135* 70 - 99 mg/dL Final  . Calcium, Ion 10/31/2014 1.13  1.13 - 1.30 mmol/L Final  . TCO2 10/31/2014 22  0 - 100 mmol/L Final  . Hemoglobin 10/31/2014 16.3* 12.0 - 15.0 g/dL Final  . HCT 10/31/2014 48.0* 36.0 - 46.0 % Final  . Prothrombin Time 10/31/2014 12.9  11.6 - 15.2 seconds Final  . INR 10/31/2014 0.96  0.00 - 1.49 Final  . aPTT 10/31/2014 30  24 - 37 seconds Final  . WBC 10/31/2014 11.7* 4.0 - 10.5 K/uL Final  . RBC 10/31/2014 4.75  3.87 - 5.11 MIL/uL Final  . Hemoglobin 10/31/2014 14.9  12.0 - 15.0 g/dL Final  . HCT 10/31/2014 43.1  36.0 - 46.0 % Final  . MCV 10/31/2014 90.7  78.0 - 100.0 fL Final  . MCH 10/31/2014 31.4  26.0 - 34.0 pg Final  . MCHC 10/31/2014 34.6  30.0 - 36.0 g/dL Final  . RDW 10/31/2014 13.6  11.5 - 15.5 % Final  . Platelets 10/31/2014 276  150 - 400 K/uL Final  . Neutrophils Relative % 10/31/2014 80* 43 - 77 % Final  . Neutro Abs 10/31/2014 9.4* 1.7 - 7.7 K/uL Final  . Lymphocytes Relative 10/31/2014 9* 12 - 46 % Final  . Lymphs Abs 10/31/2014 1.1  0.7 - 4.0 K/uL Final  . Monocytes Relative 10/31/2014 8  3 - 12 % Final  . Monocytes Absolute 10/31/2014 0.9  0.1 - 1.0 K/uL Final  . Eosinophils Relative 10/31/2014 3  0 - 5  % Final  . Eosinophils Absolute 10/31/2014 0.3  0.0 - 0.7 K/uL Final  . Basophils Relative 10/31/2014 0  0 - 1 % Final  . Basophils Absolute 10/31/2014 0.0  0.0 - 0.1 K/uL Final  . Sodium 10/31/2014 142  135 - 145 mmol/L Final  . Potassium 10/31/2014 3.4* 3.5 - 5.1 mmol/L Final  .  Chloride 10/31/2014 106  96 - 112 mmol/L Final  . CO2 10/31/2014 29  19 - 32 mmol/L Final  . Glucose, Bld 10/31/2014 138* 70 - 99 mg/dL Final  . BUN 10/31/2014 27* 6 - 23 mg/dL Final  . Creatinine, Ser 10/31/2014 2.28* 0.50 - 1.10 mg/dL Final  . Calcium 10/31/2014 9.4  8.4 - 10.5 mg/dL Final  . Total Protein 10/31/2014 6.8  6.0 - 8.3 g/dL Final  . Albumin 10/31/2014 3.9  3.5 - 5.2 g/dL Final  . AST 10/31/2014 22  0 - 37 U/L Final  . ALT 10/31/2014 10  0 - 35 U/L Final  . Alkaline Phosphatase 10/31/2014 71  39 - 117 U/L Final  . Total Bilirubin 10/31/2014 0.6  0.3 - 1.2 mg/dL Final  . GFR calc non Af Amer 10/31/2014 18* >90 mL/min Final  . GFR calc Af Amer 10/31/2014 21* >90 mL/min Final   Comment: (NOTE) The eGFR has been calculated using the CKD EPI equation. This calculation has not been validated in all clinical situations. eGFR's persistently <90 mL/min signify possible Chronic Kidney Disease.   . Anion gap 10/31/2014 7  5 - 15 Final  . Troponin i, poc 10/31/2014 0.02  0.00 - 0.08 ng/mL Final  . Comment 3 10/31/2014          Final   Comment: Due to the release kinetics of cTnI, a negative result within the first hours of the onset of symptoms does not rule out myocardial infarction with certainty. If myocardial infarction is still suspected, repeat the test at appropriate intervals.   . Opiates 10/31/2014 NONE DETECTED  NONE DETECTED Final  . Cocaine 10/31/2014 NONE DETECTED  NONE DETECTED Final  . Benzodiazepines 10/31/2014 NONE DETECTED  NONE DETECTED Final  . Amphetamines 10/31/2014 NONE DETECTED  NONE DETECTED Final  . Tetrahydrocannabinol 10/31/2014 NONE DETECTED  NONE DETECTED Final  .  Barbiturates 10/31/2014 NONE DETECTED  NONE DETECTED Final   Comment:        DRUG SCREEN FOR MEDICAL PURPOSES ONLY.  IF CONFIRMATION IS NEEDED FOR ANY PURPOSE, NOTIFY LAB WITHIN 5 DAYS.        LOWEST DETECTABLE LIMITS FOR URINE DRUG SCREEN Drug Class       Cutoff (ng/mL) Amphetamine      1000 Barbiturate      200 Benzodiazepine   086 Tricyclics       761 Opiates          300 Cocaine          300 THC              50   . Color, Urine 10/31/2014 YELLOW  YELLOW Final  . APPearance 10/31/2014 CLEAR  CLEAR Final  . Specific Gravity, Urine 10/31/2014 1.014  1.005 - 1.030 Final  . pH 10/31/2014 5.0  5.0 - 8.0 Final  . Glucose, UA 10/31/2014 NEGATIVE  NEGATIVE mg/dL Final  . Hgb urine dipstick 10/31/2014 SMALL* NEGATIVE Final  . Bilirubin Urine 10/31/2014 NEGATIVE  NEGATIVE Final  . Ketones, ur 10/31/2014 NEGATIVE  NEGATIVE mg/dL Final  . Protein, ur 10/31/2014 NEGATIVE  NEGATIVE mg/dL Final  . Urobilinogen, UA 10/31/2014 0.2  0.0 - 1.0 mg/dL Final  . Nitrite 10/31/2014 NEGATIVE  NEGATIVE Final  . Leukocytes, UA 10/31/2014 MODERATE* NEGATIVE Final  . Squamous Epithelial / LPF 10/31/2014 RARE  RARE Final  . WBC, UA 10/31/2014 3-6  <3 WBC/hpf Final  . RBC / HPF 10/31/2014 0-2  <3 RBC/hpf Final  . Bacteria, UA  10/31/2014 RARE  RARE Final  . Casts 10/31/2014 GRANULAR CAST* NEGATIVE Final  . Hgb A1c MFr Bld 10/31/2014 5.9* 4.8 - 5.6 % Final   Comment: (NOTE)         Pre-diabetes: 5.7 - 6.4         Diabetes: >6.4         Glycemic control for adults with diabetes: <7.0   . Mean Plasma Glucose 10/31/2014 123   Final   Comment: (NOTE) Performed At: [facility] [place], [place] [id number] Lindon Romp MD [id number]   . Cholesterol 11/01/2014 207* 0 - 200 mg/dL Final  . Triglycerides 11/01/2014 128  <150 mg/dL Final  . HDL 11/01/2014 45  >39 mg/dL Final  . Total CHOL/HDL Ratio 11/01/2014 4.6   Final  . VLDL 11/01/2014 26  0 - 40 mg/dL Final  . LDL  Cholesterol 11/01/2014 136* 0 - 99 mg/dL Final   Comment:        Total Cholesterol/HDL:CHD Risk Coronary Heart Disease Risk Table                     Men   Women  1/2 Average Risk   3.4   3.3  Average Risk       5.0   4.4  2 X Average Risk   9.6   7.1  3 X Average Risk  23.4   11.0        Use the calculated Patient Ratio above and the CHD Risk Table to determine the patient's CHD Risk.        ATP III CLASSIFICATION (LDL):  <100     mg/dL   Optimal  100-129  mg/dL   Near or Above                    Optimal  130-159  mg/dL   Borderline  160-189  mg/dL   High  >190     mg/dL   Very High   . Opiates 11/01/2014 NONE DETECTED  NONE DETECTED Final  . Cocaine 11/01/2014 NONE DETECTED  NONE DETECTED Final  . Benzodiazepines 11/01/2014 NONE DETECTED  NONE DETECTED Final  . Amphetamines 11/01/2014 NONE DETECTED  NONE DETECTED Final  . Tetrahydrocannabinol 11/01/2014 NONE DETECTED  NONE DETECTED Final  . Barbiturates 11/01/2014 NONE DETECTED  NONE DETECTED Final   Comment:        DRUG SCREEN FOR MEDICAL PURPOSES ONLY.  IF CONFIRMATION IS NEEDED FOR ANY PURPOSE, NOTIFY LAB WITHIN 5 DAYS.        LOWEST DETECTABLE LIMITS FOR URINE DRUG SCREEN Drug Class       Cutoff (ng/mL) Amphetamine      1000 Barbiturate      200 Benzodiazepine   509 Tricyclics       326 Opiates          300 Cocaine          300 THC              50   . TSH 11/01/2014 0.522  0.350 - 4.500 uIU/mL Final  . Total CK 11/01/2014 233* 7 - 177 U/L Final  . WBC 11/01/2014 9.6  4.0 - 10.5 K/uL Final  . RBC 11/01/2014 4.71  3.87 - 5.11 MIL/uL Final  . Hemoglobin 11/01/2014 14.8  12.0 - 15.0 g/dL Final  . HCT 11/01/2014 43.0  36.0 - 46.0 % Final  . MCV 11/01/2014 91.3  78.0 - 100.0 fL Final  .  MCH 11/01/2014 31.4  26.0 - 34.0 pg Final  . MCHC 11/01/2014 34.4  30.0 - 36.0 g/dL Final  . RDW 11/01/2014 13.6  11.5 - 15.5 % Final  . Platelets 11/01/2014 284  150 - 400 K/uL Final  . Sodium 11/01/2014 138  135 - 145 mmol/L  Final  . Potassium 11/01/2014 4.1  3.5 - 5.1 mmol/L Final   DELTA CHECK NOTED  . Chloride 11/01/2014 104  96 - 112 mmol/L Final  . CO2 11/01/2014 26  19 - 32 mmol/L Final  . Glucose, Bld 11/01/2014 103* 70 - 99 mg/dL Final  . BUN 11/01/2014 21  6 - 23 mg/dL Final  . Creatinine, Ser 11/01/2014 1.79* 0.50 - 1.10 mg/dL Final  . Calcium 11/01/2014 9.0  8.4 - 10.5 mg/dL Final  . GFR calc non Af Amer 11/01/2014 25* >90 mL/min Final  . GFR calc Af Amer 11/01/2014 28* >90 mL/min Final   Comment: (NOTE) The eGFR has been calculated using the CKD EPI equation. This calculation has not been validated in all clinical situations. eGFR's persistently <90 mL/min signify possible Chronic Kidney Disease.   . Anion gap 11/01/2014 8  5 - 15 Final  . Glucose-Capillary 11/01/2014 95  70 - 99 mg/dL Final    No results found.   Assessment/Plan   ICD-9-CM ICD-10-CM   1. Bilateral low back pain, with sciatica presence unspecified 724.2 M54.5   2. Dementia, without behavioral disturbance  294.20 F03.90   3. Essential hypertension, benign 401.1 I10   4. Hypothyroidism due to acquired atrophy of thyroid 244.8 E03.8    246.8 E03.4   5. CKD (chronic kidney disease), stage 3 (moderate) 585.3 N18.3   6. Transient cerebral ischemia, unspecified transient cerebral ischemia type 435.9 G45.9     check lumbosacral xray for back pain  labs ordered - cbc w diff, cmp, lipid panel, A1c and TSH  cont current meds as ordered  PT/OT/St as ordered  F/u with neurology as scheduled  Will follow  Nayellie Sanseverino S. Perlie Gold  [facility] [address] [place], [place] [address] [phone] Cell (Monday-Friday 8 AM - 5 PM) [phone] After 5 PM and follow prompts

## 2015-05-11 ENCOUNTER — Encounter: Payer: Self-pay | Admitting: Adult Health

## 2015-05-11 ENCOUNTER — Non-Acute Institutional Stay (SKILLED_NURSING_FACILITY): Payer: Medicare HMO | Admitting: Adult Health

## 2015-05-11 DIAGNOSIS — E034 Atrophy of thyroid (acquired): Secondary | ICD-10-CM

## 2015-05-11 DIAGNOSIS — I10 Essential (primary) hypertension: Secondary | ICD-10-CM

## 2015-05-11 DIAGNOSIS — G459 Transient cerebral ischemic attack, unspecified: Secondary | ICD-10-CM

## 2015-05-11 DIAGNOSIS — F0393 Unspecified dementia, unspecified severity, with mood disturbance: Secondary | ICD-10-CM

## 2015-05-11 DIAGNOSIS — N183 Chronic kidney disease, stage 3 (moderate): Secondary | ICD-10-CM

## 2015-05-11 DIAGNOSIS — F0280 Dementia in other diseases classified elsewhere without behavioral disturbance: Secondary | ICD-10-CM | POA: Diagnosis not present

## 2015-05-11 DIAGNOSIS — F329 Major depressive disorder, single episode, unspecified: Secondary | ICD-10-CM | POA: Diagnosis not present

## 2015-05-11 DIAGNOSIS — F0150 Vascular dementia without behavioral disturbance: Secondary | ICD-10-CM | POA: Insufficient documentation

## 2015-05-11 DIAGNOSIS — E038 Other specified hypothyroidism: Secondary | ICD-10-CM | POA: Diagnosis not present

## 2015-05-11 DIAGNOSIS — J302 Other seasonal allergic rhinitis: Secondary | ICD-10-CM | POA: Diagnosis not present

## 2015-05-11 MED ORDER — ESCITALOPRAM OXALATE 5 MG PO TABS: 5.0000 mg | ORAL_TABLET | Freq: Every day | ORAL | Status: AC

## 2015-05-11 MED ORDER — MEMANTINE HCL ER 28 MG PO CP24: 28.0000 mg | ORAL_CAPSULE | Freq: Every day | ORAL | Status: AC

## 2015-05-11 NOTE — Progress Notes (Signed)
Patient ID: Kristie Gonzales, female   DOB: [birth year], 81 y.o.   MRN: [id number]   Facility: [facility]      Allergies  Allergen Reactions  . Sulfonamide Derivatives Other (See Comments)    Unknown allergic reaction    Chief Complaint  Patient presents with  . Acute Visit    family concerns     HPI:  Her daughter is concerned about her overall status. We discussed her dementia. She is withdrawing more; is not participating in activities like she had in the recent past; does no engage with her surroundings and is not watching her tv shows she has always liked in the past. We discussed the progression of her dementia and depression as well. She would like to try to treat both issues to see if her overall will improve.    Past Medical History  Diagnosis Date  . Thyroid disease   . Hypertension   . Pneumonia 04/19/12  . Hypothyroidism     Past Surgical History  Procedure Laterality Date  . Abdominal hysterectomy    . Appendectomy      VITAL SIGNS BP 128/70 mmHg  Pulse 74  Ht 5\' 2"  (1.575 m)  Wt 170 lb (77.111 kg)  BMI 31.09 kg/m2  Patient's Medications  New Prescriptions   No medications on file  Previous Medications   ASPIRIN 325 MG TABLET    Take 1 tablet (325 mg total) by mouth daily.   ATORVASTATIN (LIPITOR) 40 MG TABLET    Take 1 tablet (40 mg total) by mouth daily at 6 PM.   LEVOTHYROXINE (SYNTHROID, LEVOTHROID) 100 MCG TABLET    Take 100 mcg by mouth daily.   LORATADINE (CLARITIN) 10 MG TABLET    Take 10 mg by mouth daily.   PROPYLENE GLYCOL 0.6 % SOLN    Apply 1 drop to eye 3 (three) times daily. To both eyes  Modified Medications   No medications on file  Discontinued Medications   No medications on file     SIGNIFICANT DIAGNOSTIC EXAMS   10-31-14: ct of head and cervical spine: 1. No acute intracranial pathology. 2. No acute osseous injury of the cervical spine.  10-31-14: right elbow x-ray: Small spur arising from the medial distal  humeral condyle. No fracture or dislocation. No appreciable joint effusion.  11-01-14; mri/mra of head: MRI HEAD: No acute intracranial process, specifically no evidence of acute ischemia. Involutional changes. Moderate white matter changes can be seen with chronic small vessel ischemic disease. Tiny stable T2 hyperintensities in the basal ganglia suggest perivascular spaces and/or lacunar infarcts. MRA HEAD: No high-grade stenosis or large vessel occlusion. Mild luminal irregularity of the anterior and posterior circulation most consistent with atherosclerosis.  11-02-14: 2-d echo: Left ventricle: The cavity size was normal. Wall thickness was normal. Systolic function was vigorous. The estimated ejection fraction was in the range of 65% to 70%. Wall motion was normal. there were no regional wall motion abnormalities. Features are consistent with a pseudonormal left ventricular filling pattern, with concomitant abnormal relaxation and increased filling pressure (grade 2 diastolic dysfunction).  11-02-14: carotid doppler: Bilateral: mild mixed plaque CCA and origin ICA. 1-39% ICA stenosis. Vertebral artery flow is antegrade.    LABS REVIEWED:   10-31-14: wbc 11.7; hgb 14.9; hct 43.1; mcv 90.7 plt2 76; glucose 138; bun 27; creat 2.28; k+3.4; na++142; liver normal albumin 3.9; hgb a1c 5.9 11-01-14: wbc 9.6; hgb 14.8; hct 43.0; mcv 91.3; plt 284; glucose 103; bun 2;1 creat 1.79;  k+4.1 ;na++138; ck 233; chol 207; ldl 136; trig 128; hdl 45; tsh 0.522     Review of Systems  Unable to perform ROS: Dementia      Physical Exam  Constitutional: No distress.  Eyes: Conjunctivae are normal.  Neck: Neck supple. No JVD present. No thyromegaly present.  Cardiovascular: Normal rate, regular rhythm and intact distal pulses.   Respiratory: Effort normal and breath sounds normal. No respiratory distress. She has no wheezes.  GI: Soft. Bowel sounds are normal. She exhibits no distension. There is no tenderness.   Musculoskeletal: She exhibits no edema.  Able to move all extremities   Lymphadenopathy:    She has no cervical adenopathy.  Neurological: She is alert.  Skin: Skin is warm and dry. She is not diaphoretic.  Psychiatric: She has a normal mood and affect.     ASSESSMENT/ PLAN:  1. Dementia 2. Depression  Will begin namenda xr 7 mg daily for one week; then 14 mg daily for one weekly then 21 mg daily for one week then 28 mg daily  Will begin lexapro 5 mg daily  Will monitor her status   .Time spent with patient 45   minutes >50% time spent counseling; reviewing medical record; tests; labs; and developing future plan of care     Synthia Innocent NP [facility]  Contact [phone] Monday through Friday 8am- 5pm  After hours call [phone]

## 2015-05-11 NOTE — Progress Notes (Signed)
Patient ID: Kristie Gonzales, female   DOB: March 14, 1926, 81 y.o.   MRN: [id number]    Facility: [facility]      Allergies  Allergen Reactions  . Sulfonamide Derivatives Other (See Comments)    Unknown allergic reaction    Chief Complaint  Patient presents with  . Medical Management of Chronic Issues    HPI:  She is a long term resident of this facility being seen for the management of her chronic illnesses. Overall her status is stable. She is unable to fully participate in the hpi or ros. There are no nursing concerns at this time.     Past Medical History  Diagnosis Date  . Thyroid disease   . Hypertension   . Pneumonia 04/19/12  . Hypothyroidism     Past Surgical History  Procedure Laterality Date  . Abdominal hysterectomy    . Appendectomy      VITAL SIGNS BP 131/62 mmHg  Pulse 80  Ht 5\' 2"  (1.575 m)  Wt 170 lb (77.111 kg)  BMI 31.09 kg/m2  SpO2 97%  Patient's Medications  New Prescriptions   ESCITALOPRAM (LEXAPRO) 5 MG TABLET    Take 1 tablet (5 mg total) by mouth daily.   MEMANTINE (NAMENDA XR) 28 MG CP24 24 HR CAPSULE    Take 1 capsule (28 mg total) by mouth daily.  Previous Medications   ASPIRIN 325 MG TABLET    Take 1 tablet (325 mg total) by mouth daily.   ATORVASTATIN (LIPITOR) 40 MG TABLET    Take 1 tablet (40 mg total) by mouth daily at 6 PM.   LEVOTHYROXINE (SYNTHROID, LEVOTHROID) 100 MCG TABLET    Take 100 mcg by mouth daily.   LORATADINE (CLARITIN) 10 MG TABLET    Take 10 mg by mouth daily.   PROPYLENE GLYCOL 0.6 % SOLN    Apply 1 drop to eye 3 (three) times daily. To both eyes  Modified Medications   No medications on file  Discontinued Medications   No medications on file     SIGNIFICANT DIAGNOSTIC EXAMS   10-31-14: ct of head and cervical spine: 1. No acute intracranial pathology. 2. No acute osseous injury of the cervical spine.  10-31-14: right elbow x-ray: Small spur arising from the medial distal humeral condyle. No  fracture or dislocation. No appreciable joint effusion.  11-01-14; mri/mra of head: MRI HEAD: No acute intracranial process, specifically no evidence of acute ischemia. Involutional changes. Moderate white matter changes can be seen with chronic small vessel ischemic disease. Tiny stable T2 hyperintensities in the basal ganglia suggest perivascular spaces and/or lacunar infarcts. MRA HEAD: No high-grade stenosis or large vessel occlusion. Mild luminal irregularity of the anterior and posterior circulation most consistent with atherosclerosis.  11-02-14: 2-d echo: Left ventricle: The cavity size was normal. Wall thickness was normal. Systolic function was vigorous. The estimated ejection fraction was in the range of 65% to 70%. Wall motion was normal. there were no regional wall motion abnormalities. Features are consistent with a pseudonormal left ventricular filling pattern, with concomitant abnormal relaxation and increased filling pressure (grade 2 diastolic dysfunction).  11-02-14: carotid doppler: Bilateral: mild mixed plaque CCA and origin ICA. 1-39% ICA stenosis. Vertebral artery flow is antegrade.  03-05-15: kub: negative intestinal gas pattern   04-09-15: lumbar spine x-ray; no acute fracture; degenerative scoliosis lateral to the left     LABS REVIEWED:   10-31-14: wbc 11.7; hgb 14.9; hct 43.1; mcv 90.7 plt2 76; glucose 138; bun 27;  creat 2.28; k+3.4; na++142; liver normal albumin 3.9; hgb a1c 5.9 11-01-14: wbc 9.6; hgb 14.8; hct 43.0; mcv 91.3; plt 284; glucose 103; bun 2;1 creat 1.79; k+4.1 ;na++138; ck 233; chol 207; ldl 136; trig 128; hdl 45; tsh 0.522 04-06-15: wbc 8.6; hgb 14.6; hct 46.0; mcv 90.9; plt 293; glucose 102; bun 32.2; creat 1.25; k+4.2; na++142; liver normal albumin 4.3 tsh 3.03 hgb a1c 6.0; chol 144; ldl 72; trig 101; hdl 52      Review of Systems  Unable to perform ROS: Dementia      Physical Exam Constitutional: No distress.  Eyes: Conjunctivae are normal.  Neck:  Neck supple. No JVD present. No thyromegaly present.  Cardiovascular: Normal rate, regular rhythm and intact distal pulses.   Respiratory: Effort normal and breath sounds normal. No respiratory distress. She has no wheezes.  GI: Soft. Bowel sounds are normal. She exhibits no distension. There is no tenderness.  Musculoskeletal: She exhibits no edema.  Able to move all extremities   Lymphadenopathy:    She has no cervical adenopathy.  Neurological: She is alert.  Skin: Skin is warm and dry. She is not diaphoretic.  Psychiatric: She has a normal mood and affect.    ASSESSMENT/ PLAN:  1. Dyslipidemia: will continue lipitor 40 mg daily her ldl is 72  2. Hypothyroidism: will continue synthroid 100 mcg daily; her tsh is 3.03  3. Allergic rhinitis: will continue claritin 10 mg daily  4. TIA: is neurologically stable; will continue asa 325 mg daily   5. Vascular dementia: no significant change in her status; will continue namenda xr 28 mg daily will monitor  6. Depression: is stable will continue lexapro 5 mg daily   7. CKD stage III: is stable her creat is 1.25; will monitor     Synthia Innocent NP [facility]  Contact [phone] Monday through Friday 8am- 5pm  After hours call [phone]

## 2015-05-17 ENCOUNTER — Emergency Department (HOSPITAL_COMMUNITY)
Admission: EM | Admit: 2015-05-17 | Discharge: 2015-05-17 | Disposition: A | Discharge status: Home / Self Care | Payer: Medicare HMO | Attending: Emergency Medicine | Admitting: Emergency Medicine

## 2015-05-17 ENCOUNTER — Encounter: Payer: Self-pay | Admitting: Internal Medicine

## 2015-05-17 ENCOUNTER — Encounter (HOSPITAL_COMMUNITY): Payer: Self-pay | Admitting: Emergency Medicine

## 2015-05-17 ENCOUNTER — Emergency Department (HOSPITAL_COMMUNITY): Payer: Medicare HMO

## 2015-05-17 ENCOUNTER — Non-Acute Institutional Stay (SKILLED_NURSING_FACILITY): Payer: Medicare HMO | Admitting: Internal Medicine

## 2015-05-17 DIAGNOSIS — Z8701 Personal history of pneumonia (recurrent): Secondary | ICD-10-CM | POA: Diagnosis not present

## 2015-05-17 DIAGNOSIS — S0990XA Unspecified injury of head, initial encounter: Secondary | ICD-10-CM | POA: Diagnosis present

## 2015-05-17 DIAGNOSIS — Z7982 Long term (current) use of aspirin: Secondary | ICD-10-CM | POA: Diagnosis not present

## 2015-05-17 DIAGNOSIS — S3992XA Unspecified injury of lower back, initial encounter: Secondary | ICD-10-CM | POA: Diagnosis not present

## 2015-05-17 DIAGNOSIS — Z79899 Other long term (current) drug therapy: Secondary | ICD-10-CM | POA: Diagnosis not present

## 2015-05-17 DIAGNOSIS — S0003XA Contusion of scalp, initial encounter: Secondary | ICD-10-CM

## 2015-05-17 DIAGNOSIS — Y9289 Other specified places as the place of occurrence of the external cause: Secondary | ICD-10-CM | POA: Insufficient documentation

## 2015-05-17 DIAGNOSIS — I10 Essential (primary) hypertension: Secondary | ICD-10-CM

## 2015-05-17 DIAGNOSIS — Y9389 Activity, other specified: Secondary | ICD-10-CM | POA: Insufficient documentation

## 2015-05-17 DIAGNOSIS — W01198A Fall on same level from slipping, tripping and stumbling with subsequent striking against other object, initial encounter: Secondary | ICD-10-CM | POA: Insufficient documentation

## 2015-05-17 DIAGNOSIS — Y998 Other external cause status: Secondary | ICD-10-CM | POA: Diagnosis not present

## 2015-05-17 DIAGNOSIS — G459 Transient cerebral ischemic attack, unspecified: Secondary | ICD-10-CM | POA: Diagnosis not present

## 2015-05-17 DIAGNOSIS — W19XXXA Unspecified fall, initial encounter: Secondary | ICD-10-CM

## 2015-05-17 DIAGNOSIS — F0150 Vascular dementia without behavioral disturbance: Secondary | ICD-10-CM | POA: Diagnosis not present

## 2015-05-17 DIAGNOSIS — R296 Repeated falls: Secondary | ICD-10-CM

## 2015-05-17 DIAGNOSIS — E039 Hypothyroidism, unspecified: Secondary | ICD-10-CM | POA: Insufficient documentation

## 2015-05-17 NOTE — ED Notes (Signed)
Bed: WA20 Expected date:  Expected time:  Means of arrival:  Comments: EMS- 81yo F, fall, head hematoma, no thinners

## 2015-05-17 NOTE — Progress Notes (Signed)
Patient ID: Kristie Gonzales, female   DOB: [birth year], 81 y.o.   MRN: [id number]    DATE: 05/17/15  Location:  [facility] [place]    Place of Service: SNF [phone])   Extended Emergency Contact Information Primary Emergency Contact: [place] Phone: [phone] Relation: Daughter  Advanced Directive information  DNR  Chief Complaint  Patient presents with  . Acute Visit    FALL    HPI:  81 yo female long term resident seen today for an acute visit. She fell some time last. The fall was unwitnessed and she evidently hit the back of her head (object unknown) and sustained a cut. The daughter came early this AM as usual to do her hair, sprayed water on her hair, and noticed the blood dripping. She had dried blood on her shirt. Daughter also noted bruises on pt's RUE today. Pt also fell onto both knees this AM in the cafeteria after getting out of her w/c in an attempt to get some coffee. She fell onto both knees. No head injury. She is unable to walk without assistance. Nursing has applied cold compress to head for the last 2 hrs and scalp still oozing blood. She takes ASA $RemoveB'325mg'CTnghTLj$  daily for hx TIA. Pt has no c/o. She is a poor historian due to dementia. Hx obtained from nursing and daughter.  Past Medical History  Diagnosis Date  . Thyroid disease   . Hypertension   . Pneumonia 04/19/12  . Hypothyroidism     Past Surgical History  Procedure Laterality Date  . Abdominal hysterectomy    . Appendectomy      Patient Care Team: Gildardo Cranker, DO as PCP - General (Internal Medicine) Gerlene Fee, NP as Nurse Practitioner (Nurse Practitioner)  Social History   Social History  . Marital Status: Widowed    Spouse Name: N/A  . Number of Children: N/A  . Years of Education: N/A   Occupational History  . Not on file.   Social History Main Topics  . Smoking status: Never Smoker   . Smokeless tobacco: Not on file  . Alcohol Use: No  .  Drug Use: No  . Sexual Activity: Not on file   Other Topics Concern  . Not on file   Social History Narrative     reports that she has never smoked. She does not have any smokeless tobacco history on file. She reports that she does not drink alcohol or use illicit drugs.  Immunization History  Administered Date(s) Administered  . PPD Test 11/03/2014    Allergies  Allergen Reactions  . Sulfonamide Derivatives Other (See Comments)    Unknown allergic reaction    Medications: Patient's Medications  New Prescriptions   No medications on file  Previous Medications   ASPIRIN 325 MG TABLET    Take 1 tablet (325 mg total) by mouth daily.   ATORVASTATIN (LIPITOR) 40 MG TABLET    Take 1 tablet (40 mg total) by mouth daily at 6 PM.   ESCITALOPRAM (LEXAPRO) 5 MG TABLET    Take 1 tablet (5 mg total) by mouth daily.   LEVOTHYROXINE (SYNTHROID, LEVOTHROID) 100 MCG TABLET    Take 100 mcg by mouth daily.   LORATADINE (CLARITIN) 10 MG TABLET    Take 10 mg by mouth daily.   MAGNESIUM HYDROXIDE (MILK OF MAGNESIA) 400 MG/5ML SUSPENSION    Take 30 mLs by mouth daily as needed for mild constipation or moderate constipation.  MEMANTINE (NAMENDA XR) 28 MG CP24 24 HR CAPSULE    Take 1 capsule (28 mg total) by mouth daily.   NUTRITIONAL SUPPLEMENTS (NUTRITIONAL SUPPLEMENT PO)    Take 60 mLs by mouth 3 (three) times daily.   PROPYLENE GLYCOL 0.6 % SOLN    Place 1 drop into both eyes 3 (three) times daily.   Modified Medications   No medications on file  Discontinued Medications   No medications on file    Review of Systems  Unable to perform ROS: Dementia    Filed Vitals:   05/17/15 1943  BP: 150/81  Pulse: 85  Temp: 97.6 F (36.4 C)  Weight: 170 lb (77.111 kg)  SpO2: 95%   Body mass index is 31.09 kg/(m^2).  Physical Exam  Constitutional: She appears well-developed and well-nourished. No distress.  Sitting in w/c in NAD. Daughter present  HENT:  Head: Normocephalic. Not  macrocephalic. Head is with contusion and with laceration. Head is without raccoon's eyes and without Battle's sign.    Mouth/Throat: Oropharynx is clear and moist. No oropharyngeal exudate.  Eyes: Pupils are equal, round, and reactive to light. No scleral icterus.  Neck: Neck supple. Spinous process tenderness and muscular tenderness present. Carotid bruit is not present. No rigidity. No tracheal deviation and normal range of motion present. No thyromegaly present.  Cardiovascular: Normal rate, regular rhythm and intact distal pulses.  Exam reveals no gallop and no friction rub.   Murmur (1/6 SEM) heard. Trace LE edema b/l. no calf TTP.   Pulmonary/Chest: Effort normal and breath sounds normal. No stridor. No respiratory distress. She has no wheezes. She has no rales.  Abdominal: Soft. Bowel sounds are normal. She exhibits no distension and no mass. There is no hepatomegaly. There is no tenderness. There is no rebound and no guarding.  Musculoskeletal: She exhibits edema and tenderness.  No visible contusion seen on knee b/l  Lymphadenopathy:    She has no cervical adenopathy.  Neurological: She is alert.  Unable to fully assess due to dementia but no focal deficits. Grip strength intact  Skin: Skin is warm and dry. No rash noted.  Yellowing contusion to right elbow/distal posterior arm. Distal right forearm/wrist purplish large bruise  Psychiatric: She has a normal mood and affect. Her behavior is normal.     Labs reviewed: No visits with results within 3 Month(s) from this visit. Latest known visit with results is:  Admission on 10/31/2014, Discharged on 11/03/2014  Component Date Value Ref Range Status  . Alcohol, Ethyl (B) 10/31/2014 <5  0 - 9 mg/dL Final   Comment:        LOWEST DETECTABLE LIMIT FOR SERUM ALCOHOL IS 11 mg/dL FOR MEDICAL PURPOSES ONLY   . Sodium 10/31/2014 142  135 - 145 mmol/L Final  . Potassium 10/31/2014 3.4* 3.5 - 5.1 mmol/L Final  . Chloride 10/31/2014  102  96 - 112 mmol/L Final  . BUN 10/31/2014 30* 6 - 23 mg/dL Final  . Creatinine, Ser 10/31/2014 2.10* 0.50 - 1.10 mg/dL Final  . Glucose, Bld 10/31/2014 135* 70 - 99 mg/dL Final  . Calcium, Ion 10/31/2014 1.13  1.13 - 1.30 mmol/L Final  . TCO2 10/31/2014 22  0 - 100 mmol/L Final  . Hemoglobin 10/31/2014 16.3* 12.0 - 15.0 g/dL Final  . HCT 10/31/2014 48.0* 36.0 - 46.0 % Final  . Prothrombin Time 10/31/2014 12.9  11.6 - 15.2 seconds Final  . INR 10/31/2014 0.96  0.00 - 1.49 Final  . aPTT 10/31/2014  30  24 - 37 seconds Final  . WBC 10/31/2014 11.7* 4.0 - 10.5 K/uL Final  . RBC 10/31/2014 4.75  3.87 - 5.11 MIL/uL Final  . Hemoglobin 10/31/2014 14.9  12.0 - 15.0 g/dL Final  . HCT 10/31/2014 43.1  36.0 - 46.0 % Final  . MCV 10/31/2014 90.7  78.0 - 100.0 fL Final  . MCH 10/31/2014 31.4  26.0 - 34.0 pg Final  . MCHC 10/31/2014 34.6  30.0 - 36.0 g/dL Final  . RDW 10/31/2014 13.6  11.5 - 15.5 % Final  . Platelets 10/31/2014 276  150 - 400 K/uL Final  . Neutrophils Relative % 10/31/2014 80* 43 - 77 % Final  . Neutro Abs 10/31/2014 9.4* 1.7 - 7.7 K/uL Final  . Lymphocytes Relative 10/31/2014 9* 12 - 46 % Final  . Lymphs Abs 10/31/2014 1.1  0.7 - 4.0 K/uL Final  . Monocytes Relative 10/31/2014 8  3 - 12 % Final  . Monocytes Absolute 10/31/2014 0.9  0.1 - 1.0 K/uL Final  . Eosinophils Relative 10/31/2014 3  0 - 5 % Final  . Eosinophils Absolute 10/31/2014 0.3  0.0 - 0.7 K/uL Final  . Basophils Relative 10/31/2014 0  0 - 1 % Final  . Basophils Absolute 10/31/2014 0.0  0.0 - 0.1 K/uL Final  . Sodium 10/31/2014 142  135 - 145 mmol/L Final  . Potassium 10/31/2014 3.4* 3.5 - 5.1 mmol/L Final  . Chloride 10/31/2014 106  96 - 112 mmol/L Final  . CO2 10/31/2014 29  19 - 32 mmol/L Final  . Glucose, Bld 10/31/2014 138* 70 - 99 mg/dL Final  . BUN 10/31/2014 27* 6 - 23 mg/dL Final  . Creatinine, Ser 10/31/2014 2.28* 0.50 - 1.10 mg/dL Final  . Calcium 10/31/2014 9.4  8.4 - 10.5 mg/dL Final  . Total  Protein 10/31/2014 6.8  6.0 - 8.3 g/dL Final  . Albumin 10/31/2014 3.9  3.5 - 5.2 g/dL Final  . AST 10/31/2014 22  0 - 37 U/L Final  . ALT 10/31/2014 10  0 - 35 U/L Final  . Alkaline Phosphatase 10/31/2014 71  39 - 117 U/L Final  . Total Bilirubin 10/31/2014 0.6  0.3 - 1.2 mg/dL Final  . GFR calc non Af Amer 10/31/2014 18* >90 mL/min Final  . GFR calc Af Amer 10/31/2014 21* >90 mL/min Final   Comment: (NOTE) The eGFR has been calculated using the CKD EPI equation. This calculation has not been validated in all clinical situations. eGFR's persistently <90 mL/min signify possible Chronic Kidney Disease.   . Anion gap 10/31/2014 7  5 - 15 Final  . Troponin i, poc 10/31/2014 0.02  0.00 - 0.08 ng/mL Final  . Comment 3 10/31/2014          Final   Comment: Due to the release kinetics of cTnI, a negative result within the first hours of the onset of symptoms does not rule out myocardial infarction with certainty. If myocardial infarction is still suspected, repeat the test at appropriate intervals.   . Opiates 10/31/2014 NONE DETECTED  NONE DETECTED Final  . Cocaine 10/31/2014 NONE DETECTED  NONE DETECTED Final  . Benzodiazepines 10/31/2014 NONE DETECTED  NONE DETECTED Final  . Amphetamines 10/31/2014 NONE DETECTED  NONE DETECTED Final  . Tetrahydrocannabinol 10/31/2014 NONE DETECTED  NONE DETECTED Final  . Barbiturates 10/31/2014 NONE DETECTED  NONE DETECTED Final   Comment:        DRUG SCREEN FOR MEDICAL PURPOSES ONLY.  IF CONFIRMATION IS NEEDED FOR  ANY PURPOSE, NOTIFY LAB WITHIN 5 DAYS.        LOWEST DETECTABLE LIMITS FOR URINE DRUG SCREEN Drug Class       Cutoff (ng/mL) Amphetamine      1000 Barbiturate      200 Benzodiazepine   891 Tricyclics       694 Opiates          300 Cocaine          300 THC              50   . Color, Urine 10/31/2014 YELLOW  YELLOW Final  . APPearance 10/31/2014 CLEAR  CLEAR Final  . Specific Gravity, Urine 10/31/2014 1.014  1.005 - 1.030 Final    . pH 10/31/2014 5.0  5.0 - 8.0 Final  . Glucose, UA 10/31/2014 NEGATIVE  NEGATIVE mg/dL Final  . Hgb urine dipstick 10/31/2014 SMALL* NEGATIVE Final  . Bilirubin Urine 10/31/2014 NEGATIVE  NEGATIVE Final  . Ketones, ur 10/31/2014 NEGATIVE  NEGATIVE mg/dL Final  . Protein, ur 10/31/2014 NEGATIVE  NEGATIVE mg/dL Final  . Urobilinogen, UA 10/31/2014 0.2  0.0 - 1.0 mg/dL Final  . Nitrite 10/31/2014 NEGATIVE  NEGATIVE Final  . Leukocytes, UA 10/31/2014 MODERATE* NEGATIVE Final  . Squamous Epithelial / LPF 10/31/2014 RARE  RARE Final  . WBC, UA 10/31/2014 3-6  <3 WBC/hpf Final  . RBC / HPF 10/31/2014 0-2  <3 RBC/hpf Final  . Bacteria, UA 10/31/2014 RARE  RARE Final  . Casts 10/31/2014 GRANULAR CAST* NEGATIVE Final  . Hgb A1c MFr Bld 10/31/2014 5.9* 4.8 - 5.6 % Final   Comment: (NOTE)         Pre-diabetes: 5.7 - 6.4         Diabetes: >6.4         Glycemic control for adults with diabetes: <7.0   . Mean Plasma Glucose 10/31/2014 123   Final   Comment: (NOTE) Performed At: [facility] [place], [place] [id number] Lindon Romp MD [id number]   . Cholesterol 11/01/2014 207* 0 - 200 mg/dL Final  . Triglycerides 11/01/2014 128  <150 mg/dL Final  . HDL 11/01/2014 45  >39 mg/dL Final  . Total CHOL/HDL Ratio 11/01/2014 4.6   Final  . VLDL 11/01/2014 26  0 - 40 mg/dL Final  . LDL Cholesterol 11/01/2014 136* 0 - 99 mg/dL Final   Comment:        Total Cholesterol/HDL:CHD Risk Coronary Heart Disease Risk Table                     Men   Women  1/2 Average Risk   3.4   3.3  Average Risk       5.0   4.4  2 X Average Risk   9.6   7.1  3 X Average Risk  23.4   11.0        Use the calculated Patient Ratio above and the CHD Risk Table to determine the patient's CHD Risk.        ATP III CLASSIFICATION (LDL):  <100     mg/dL   Optimal  100-129  mg/dL   Near or Above                    Optimal  130-159  mg/dL   Borderline  160-189  mg/dL   High  >190     mg/dL    Very High   . Opiates 11/01/2014 NONE DETECTED  NONE DETECTED Final  . Cocaine 11/01/2014 NONE DETECTED  NONE DETECTED Final  . Benzodiazepines 11/01/2014 NONE DETECTED  NONE DETECTED Final  . Amphetamines 11/01/2014 NONE DETECTED  NONE DETECTED Final  . Tetrahydrocannabinol 11/01/2014 NONE DETECTED  NONE DETECTED Final  . Barbiturates 11/01/2014 NONE DETECTED  NONE DETECTED Final   Comment:        DRUG SCREEN FOR MEDICAL PURPOSES ONLY.  IF CONFIRMATION IS NEEDED FOR ANY PURPOSE, NOTIFY LAB WITHIN 5 DAYS.        LOWEST DETECTABLE LIMITS FOR URINE DRUG SCREEN Drug Class       Cutoff (ng/mL) Amphetamine      1000 Barbiturate      200 Benzodiazepine   782 Tricyclics       956 Opiates          300 Cocaine          300 THC              50   . TSH 11/01/2014 0.522  0.350 - 4.500 uIU/mL Final  . Total CK 11/01/2014 233* 7 - 177 U/L Final  . WBC 11/01/2014 9.6  4.0 - 10.5 K/uL Final  . RBC 11/01/2014 4.71  3.87 - 5.11 MIL/uL Final  . Hemoglobin 11/01/2014 14.8  12.0 - 15.0 g/dL Final  . HCT 11/01/2014 43.0  36.0 - 46.0 % Final  . MCV 11/01/2014 91.3  78.0 - 100.0 fL Final  . MCH 11/01/2014 31.4  26.0 - 34.0 pg Final  . MCHC 11/01/2014 34.4  30.0 - 36.0 g/dL Final  . RDW 11/01/2014 13.6  11.5 - 15.5 % Final  . Platelets 11/01/2014 284  150 - 400 K/uL Final  . Sodium 11/01/2014 138  135 - 145 mmol/L Final  . Potassium 11/01/2014 4.1  3.5 - 5.1 mmol/L Final   DELTA CHECK NOTED  . Chloride 11/01/2014 104  96 - 112 mmol/L Final  . CO2 11/01/2014 26  19 - 32 mmol/L Final  . Glucose, Bld 11/01/2014 103* 70 - 99 mg/dL Final  . BUN 11/01/2014 21  6 - 23 mg/dL Final  . Creatinine, Ser 11/01/2014 1.79* 0.50 - 1.10 mg/dL Final  . Calcium 11/01/2014 9.0  8.4 - 10.5 mg/dL Final  . GFR calc non Af Amer 11/01/2014 25* >90 mL/min Final  . GFR calc Af Amer 11/01/2014 28* >90 mL/min Final   Comment: (NOTE) The eGFR has been calculated using the CKD EPI equation. This calculation has not been  validated in all clinical situations. eGFR's persistently <90 mL/min signify possible Chronic Kidney Disease.   . Anion gap 11/01/2014 8  5 - 15 Final  . Glucose-Capillary 11/01/2014 95  70 - 99 mg/dL Final     Assessment/Plan   ICD-9-CM ICD-10-CM   1. Scalp hematoma, initial encounter - bleeding 920 S00.03XA   2. Recurrent falls - probably related to #3 but r/o metabolic cause O13.08 M57.8   3. Vascular dementia without behavioral disturbance  290.40 F01.50   4. Essential hypertension, benign - with elevated readings 401.1 I10   5. Transient cerebral ischemia, unspecified transient cerebral ischemia type - on ASA 435.9 G45.9     --send to ED for eval of bleeding scalp with hematoma s/p fall  --cont other meds as ordered  --will follow Darla Mcdonald S. Perlie Gold  [facility] [address] [place], [place] [address] [phone] Cell (Monday-Friday 8 AM - 5 PM) [phone] After 5 PM  and follow prompts

## 2015-05-17 NOTE — Discharge Instructions (Signed)
Facial or Scalp Contusion A facial or scalp contusion is a deep bruise on the face or head. Injuries to the face and head generally cause a lot of swelling, especially around the eyes. Contusions are the result of an injury that caused bleeding under the skin. The contusion may turn blue, purple, or yellow. Minor injuries will give you a painless contusion, but more severe contusions may stay painful and swollen for a few weeks.  CAUSES  A facial or scalp contusion is caused by a blunt injury or trauma to the face or head area.  SIGNS AND SYMPTOMS   Swelling of the injured area.   Discoloration of the injured area.   Tenderness, soreness, or pain in the injured area.  DIAGNOSIS  The diagnosis can be made by taking a medical history and doing a physical exam. An X-ray exam, CT scan, or MRI may be needed to determine if there are any associated injuries, such as broken bones (fractures). TREATMENT  Often, the best treatment for a facial or scalp contusion is applying cold compresses to the injured area. Over-the-counter medicines may also be recommended for pain control.  HOME CARE INSTRUCTIONS   Only take over-the-counter or prescription medicines as directed by your health care provider.   Apply ice to the injured area.   Put ice in a plastic bag.   Place a towel between your skin and the bag.   Leave the ice on for 20 minutes, 2-3 times a day.  SEEK MEDICAL CARE IF:  You have bite problems.   You have pain with chewing.   You are concerned about facial defects. SEEK IMMEDIATE MEDICAL CARE IF:  You have severe pain or a headache that is not relieved by medicine.   You have unusual sleepiness, confusion, or personality changes.   You throw up (vomit).   You have a persistent nosebleed.   You have double vision or blurred vision.   You have fluid drainage from your nose or ear.   You have difficulty walking or using your arms or legs.  MAKE SURE YOU:    Understand these instructions.  Will watch your condition.  Will get help right away if you are not doing well or get worse. Document Released: 10/26/2004 Document Revised: 07/09/2013 Document Reviewed: 05/01/2013 ExitCare Patient Information 2015 [employer]. This information is not intended to replace advice given to you by your health care provider. Make sure you discuss any questions you have with your health care provider.  

## 2015-05-17 NOTE — ED Notes (Addendum)
Per EMS- (from [facility]) fell this morning around 0600 am. Ambulatory back to bed. Found patient this morning with a hematoma on the posterior side of the head. C/o head pain. Denies neck or back pain. Alert and oriented to baseline. Not on blood thinners. Normally ambulates with assistance or uses a wheelchair. BP 164/72 HR 74. Moves all extremities.   Hematoma noted to posterior side of head. No active bleeding noted.

## 2015-05-26 NOTE — ED Provider Notes (Signed)
CSN: [id number]     Arrival date & time 05/17/15  1147 History   First MD Initiated Contact with Patient 05/17/15 1154     Chief Complaint  Patient presents with  . Fall     (Consider location/radiation/quality/duration/timing/severity/associated sxs/prior Treatment) HPI   88yF presenting after fall. Happened around 0600 today. Struck head. She doesn't think she had loc. Mild pain in back of head. No neck, back or acute pain elsewhere. No acute visual changes, numbness, tingling or loss of strength. Takes asa. No n/v. Reportedly at baseline mental status.   Past Medical History  Diagnosis Date  . Thyroid disease   . Hypertension   . Pneumonia 04/19/12  . Hypothyroidism    Past Surgical History  Procedure Laterality Date  . Abdominal hysterectomy    . Appendectomy     Family History  Problem Relation Age of Onset  . Diabetes Son   . Heart disease Son    Social History  Substance Use Topics  . Smoking status: Never Smoker   . Smokeless tobacco: None  . Alcohol Use: No   OB History    No data available     Review of Systems  All systems reviewed and negative, other than as noted in HPI.   Allergies  Sulfonamide derivatives  Home Medications   Prior to Admission medications   Medication Sig Start Date End Date Taking? Authorizing Provider  aspirin 325 MG tablet Take 1 tablet (325 mg total) by mouth daily. 11/03/14  Yes Costin Otelia Sergeant, MD  atorvastatin (LIPITOR) 40 MG tablet Take 1 tablet (40 mg total) by mouth daily at 6 PM. 11/03/14  Yes Costin Otelia Sergeant, MD  escitalopram (LEXAPRO) 5 MG tablet Take 1 tablet (5 mg total) by mouth daily. 05/11/15  Yes Sharee Holster, NP  levothyroxine (SYNTHROID, LEVOTHROID) 100 MCG tablet Take 100 mcg by mouth daily.   Yes Historical Provider, MD  loratadine (CLARITIN) 10 MG tablet Take 10 mg by mouth daily.   Yes Historical Provider, MD  magnesium hydroxide (MILK OF MAGNESIA) 400 MG/5ML suspension Take 30 mLs by mouth daily as  needed for mild constipation or moderate constipation.   Yes Historical Provider, MD  memantine (NAMENDA XR) 28 MG CP24 24 hr capsule Take 1 capsule (28 mg total) by mouth daily. 05/11/15  Yes Sharee Holster, NP  Nutritional Supplements (NUTRITIONAL SUPPLEMENT PO) Take 60 mLs by mouth 3 (three) times daily.   Yes Historical Provider, MD  Propylene Glycol 0.6 % SOLN Place 1 drop into both eyes 3 (three) times daily.    Yes Historical Provider, MD   BP 154/70 mmHg  Pulse 91  Temp(Src) 97.6 F (36.4 C) (Oral)  Resp 15  SpO2 95% Physical Exam  Constitutional: She appears well-developed and well-nourished. No distress.  HENT:  Head: Normocephalic and atraumatic.  Small posterior scalp hematoma. No laceration.   Eyes: Conjunctivae are normal. Right eye exhibits no discharge. Left eye exhibits no discharge.  Neck: Neck supple.  Cardiovascular: Normal rate, regular rhythm and normal heart sounds.  Exam reveals no gallop and no friction rub.   No murmur heard. Pulmonary/Chest: Effort normal and breath sounds normal. No respiratory distress.  Abdominal: Soft. She exhibits no distension. There is no tenderness.  Musculoskeletal: She exhibits no edema or tenderness.  No midline spinal tenderness. No apparent pain with rom of large joints or bony tenderness of extremtiies.   Neurological: She is alert.  Skin: Skin is warm and dry.  Psychiatric: She  has a normal mood and affect. Her behavior is normal. Thought content normal.  Nursing note and vitals reviewed.   ED Course  Procedures (including critical care time) Labs Review Labs Reviewed - No data to display  Imaging Review No results found.   Ct Head Wo Contrast  05/17/2015   CLINICAL DATA:  Fall at nursing home. Injury at 0600 hours. Posterior scalp hematoma.  EXAM: CT HEAD WITHOUT CONTRAST  TECHNIQUE: Contiguous axial images were obtained from the base of the skull through the vertex without intravenous contrast.  COMPARISON:  CT head  10/31/2014.  FINDINGS: No mass lesion, mass effect, midline shift, hydrocephalus, hemorrhage. No acute territorial cortical ischemia/infarct. Atrophy and chronic ischemic white matter disease is present. RIGHT parietal and occipital scalp hematoma is present. There is no underlying skull fracture. No adjacent hemorrhage. Soft tissue thickening is present in the LEFT supraorbital region which may also represent contusion or scar.  IMPRESSION: 1. Atrophy and chronic ischemic white matter disease without acute intracranial abnormality. 2. LEFT parietal and occipital scalp hematoma.   Electronically Signed   By: Andreas Newport M.D.   On: 05/17/2015 13:28   I have personally reviewed and evaluated these images and lab results as part of my medical decision-making.   EKG Interpretation None      MDM   Final diagnoses:  Fall, initial encounter  Scalp hematoma, initial encounter    88yf presenting after fall. Small posterior scalp hematoma. Otherwise nonfocal exam. Baseline mental status per report. Negative imaging. It has been determined that no acute conditions requiring further emergency intervention are present at this time. The patient has been advised of the diagnosis and plan. I reviewed any labs and imaging including any potential incidental findings. We have discussed signs and symptoms that warrant return to the ED and they are listed in the discharge instructions.      Raeford Razor, MD 05/26/15 1450

## 2015-06-16 ENCOUNTER — Non-Acute Institutional Stay (SKILLED_NURSING_FACILITY): Payer: Medicare HMO | Admitting: Adult Health

## 2015-06-16 DIAGNOSIS — F0150 Vascular dementia without behavioral disturbance: Secondary | ICD-10-CM

## 2015-06-16 DIAGNOSIS — J302 Other seasonal allergic rhinitis: Secondary | ICD-10-CM | POA: Diagnosis not present

## 2015-06-16 DIAGNOSIS — E038 Other specified hypothyroidism: Secondary | ICD-10-CM | POA: Diagnosis not present

## 2015-06-16 DIAGNOSIS — F0280 Dementia in other diseases classified elsewhere without behavioral disturbance: Secondary | ICD-10-CM | POA: Diagnosis not present

## 2015-06-16 DIAGNOSIS — N183 Chronic kidney disease, stage 3 (moderate): Secondary | ICD-10-CM | POA: Diagnosis not present

## 2015-06-16 DIAGNOSIS — F329 Major depressive disorder, single episode, unspecified: Secondary | ICD-10-CM

## 2015-06-16 DIAGNOSIS — F0393 Unspecified dementia, unspecified severity, with mood disturbance: Secondary | ICD-10-CM

## 2015-06-16 DIAGNOSIS — I10 Essential (primary) hypertension: Secondary | ICD-10-CM | POA: Diagnosis not present

## 2015-06-16 DIAGNOSIS — G459 Transient cerebral ischemic attack, unspecified: Secondary | ICD-10-CM

## 2015-06-16 DIAGNOSIS — E034 Atrophy of thyroid (acquired): Secondary | ICD-10-CM | POA: Diagnosis not present

## 2015-06-25 ENCOUNTER — Emergency Department (HOSPITAL_COMMUNITY): Payer: Medicare HMO

## 2015-06-25 ENCOUNTER — Encounter (HOSPITAL_COMMUNITY): Payer: Self-pay | Admitting: Emergency Medicine

## 2015-06-25 ENCOUNTER — Emergency Department (HOSPITAL_COMMUNITY)
Admission: EM | Admit: 2015-06-25 | Discharge: 2015-06-26 | Disposition: A | Discharge status: Home / Self Care | Payer: Medicare HMO | Attending: Emergency Medicine | Admitting: Emergency Medicine

## 2015-06-25 DIAGNOSIS — F0390 Unspecified dementia without behavioral disturbance: Secondary | ICD-10-CM | POA: Diagnosis not present

## 2015-06-25 DIAGNOSIS — Z7982 Long term (current) use of aspirin: Secondary | ICD-10-CM | POA: Diagnosis not present

## 2015-06-25 DIAGNOSIS — I10 Essential (primary) hypertension: Secondary | ICD-10-CM | POA: Diagnosis not present

## 2015-06-25 DIAGNOSIS — Y998 Other external cause status: Secondary | ICD-10-CM | POA: Insufficient documentation

## 2015-06-25 DIAGNOSIS — Z79899 Other long term (current) drug therapy: Secondary | ICD-10-CM | POA: Insufficient documentation

## 2015-06-25 DIAGNOSIS — S0990XA Unspecified injury of head, initial encounter: Secondary | ICD-10-CM | POA: Diagnosis present

## 2015-06-25 DIAGNOSIS — Z8701 Personal history of pneumonia (recurrent): Secondary | ICD-10-CM | POA: Diagnosis not present

## 2015-06-25 DIAGNOSIS — Y92129 Unspecified place in nursing home as the place of occurrence of the external cause: Secondary | ICD-10-CM

## 2015-06-25 DIAGNOSIS — W19XXXA Unspecified fall, initial encounter: Secondary | ICD-10-CM | POA: Diagnosis not present

## 2015-06-25 DIAGNOSIS — E039 Hypothyroidism, unspecified: Secondary | ICD-10-CM | POA: Insufficient documentation

## 2015-06-25 DIAGNOSIS — Y9389 Activity, other specified: Secondary | ICD-10-CM | POA: Insufficient documentation

## 2015-06-25 DIAGNOSIS — S0101XA Laceration without foreign body of scalp, initial encounter: Secondary | ICD-10-CM | POA: Insufficient documentation

## 2015-06-25 DIAGNOSIS — Y92128 Other place in nursing home as the place of occurrence of the external cause: Secondary | ICD-10-CM | POA: Diagnosis not present

## 2015-06-25 HISTORY — DX: Unspecified dementia, unspecified severity, without behavioral disturbance, psychotic disturbance, mood disturbance, and anxiety: F03.90

## 2015-06-25 NOTE — ED Notes (Signed)
Patient transported to CT 

## 2015-06-25 NOTE — ED Notes (Signed)
[facility] presents with a 81 yo female from [facility] with a unwitnessed fall.  Pt has minor laceration on posterior portion of head no more than 1.5 cm in length with minimal bleeding.  Hx of HTN and nonverbal dementia was reported but patient can communicate but very little and will follow some commands.  Hx of TIA.

## 2015-06-25 NOTE — ED Notes (Signed)
Bed: WA02 Expected date:  Expected time:  Means of arrival:  Comments: EMS 70yr F; fall, laceration / HTN

## 2015-06-25 NOTE — ED Provider Notes (Signed)
CSN: [id number]     Arrival date & time 06/25/15  2205 History   First MD Initiated Contact with Patient 06/25/15 2240     Chief Complaint  Patient presents with  . Fall  . Head Laceration     (Consider location/radiation/quality/duration/timing/severity/associated sxs/prior Treatment) HPI Comments: Arrives via EMS from [facility] after an unwitnessed fall. Patient with history of dementia, reported to be nonverbal, but patient makes eye contact, answers some questions slowly, is following commands.  Patient is a 81 y.o. female presenting with fall and scalp laceration. The history is provided by the EMS personnel and the nursing home. The history is limited by the absence of a caregiver. No language interpreter was used.  Fall This is a new problem. The current episode started today. The problem has been unchanged. Associated symptoms comments: Superficial head laceration.  Head Laceration    Past Medical History  Diagnosis Date  . Thyroid disease   . Hypertension   . Pneumonia 04/19/12  . Hypothyroidism   . Dementia    Past Surgical History  Procedure Laterality Date  . Abdominal hysterectomy    . Appendectomy     Family History  Problem Relation Age of Onset  . Diabetes Son   . Heart disease Son    Social History  Substance Use Topics  . Smoking status: Never Smoker   . Smokeless tobacco: None  . Alcohol Use: No   OB History    No data available     Review of Systems  Unable to perform ROS: Dementia      Allergies  Sulfonamide derivatives  Home Medications   Prior to Admission medications   Medication Sig Start Date End Date Taking? Authorizing Provider  aspirin 325 MG tablet Take 1 tablet (325 mg total) by mouth daily. 11/03/14  Yes Costin Otelia Sergeant, MD  atorvastatin (LIPITOR) 40 MG tablet Take 1 tablet (40 mg total) by mouth daily at 6 PM. 11/03/14  Yes Costin Otelia Sergeant, MD  escitalopram (LEXAPRO) 5 MG tablet Take 1 tablet (5 mg total) by mouth daily.  05/11/15  Yes Sharee Holster, NP  levothyroxine (SYNTHROID, LEVOTHROID) 100 MCG tablet Take 100 mcg by mouth daily.   Yes Historical Provider, MD  loratadine (CLARITIN) 10 MG tablet Take 10 mg by mouth daily.   Yes Historical Provider, MD  magnesium hydroxide (MILK OF MAGNESIA) 400 MG/5ML suspension Take 30 mLs by mouth daily as needed for mild constipation or moderate constipation.   Yes Historical Provider, MD  memantine (NAMENDA XR) 28 MG CP24 24 hr capsule Take 1 capsule (28 mg total) by mouth daily. 05/11/15  Yes Sharee Holster, NP  Nutritional Supplements (NUTRITIONAL SUPPLEMENT PO) Take 60 mLs by mouth 3 (three) times daily.   Yes Historical Provider, MD  Propylene Glycol 0.6 % SOLN Place 1 drop into both eyes 3 (three) times daily.    Yes Historical Provider, MD   BP 194/82 mmHg  Pulse 82  Temp(Src) 99 F (37.2 C) (Oral)  Resp 16  SpO2 96% Physical Exam  Constitutional: She appears well-developed and well-nourished.  HENT:  Head: Head is with laceration.    Eyes: Conjunctivae are normal. Pupils are equal, round, and reactive to light.  Neck: Neck supple.  Cardiovascular: Normal rate and regular rhythm.   Pulmonary/Chest: Effort normal and breath sounds normal.  Abdominal: Soft. Bowel sounds are normal.  Musculoskeletal: She exhibits no edema.  Neurological: She has normal strength.  Skin: Skin is warm and dry.  Psychiatric: She is not combative.  Nursing note and vitals reviewed.   ED Course  Procedures (including critical care time)  LACERATION REPAIR Performed by: Jimmye Norman Authorized by: Jimmye Norman Consent: Verbal consent obtained.  Patient identity confirmed: provided demographic data Prepped and Draped in normal sterile fashion Wound explored  Laceration Location: scalp  Laceration Length: 1 cm  No Foreign Bodies seen or palpated  Anesthesia: topical  Anesthetic total: 1 spray  Skin closure: staples  Number of staples: 2    Patient  tolerance: Patient tolerated the procedure well with no immediate complications. Labs Review Labs Reviewed - No data to display  Imaging Review Ct Head Wo Contrast  06/26/2015   CLINICAL DATA:  S/P unwitnessed fall. Pt has minor laceration on posterior portion of head no more than 1.5 cm in length with minimal bleeding  EXAM: CT HEAD WITHOUT CONTRAST  CT CERVICAL SPINE WITHOUT CONTRAST  TECHNIQUE: Multidetector CT imaging of the head and cervical spine was performed following the standard protocol without intravenous contrast. Multiplanar CT image reconstructions of the cervical spine were also generated.  COMPARISON:  CT head 05/17/2015. MRI brain 11/01/2014. CT head and cervical spine 10/31/2014.  FINDINGS: CT HEAD FINDINGS  Diffuse cerebral atrophy. Mild ventricular dilatation consistent with central atrophy. Low-attenuation changes in the deep white matter consistent with small vessel ischemia. Subcutaneous scalp hematoma over the left posterior parietal region. No mass effect or midline shift. No abnormal extra-axial fluid collections. Gray-white matter junctions are distinct. Basal cisterns are not effaced. No evidence of acute intracranial hemorrhage. No depressed skull fractures. Mild mucosal thickening in the paranasal sinuses. No acute air-fluid levels. Mastoid air cells are not opacified.  CT CERVICAL SPINE FINDINGS  Normal alignment of the cervical spine. Multilevel degenerative changes with narrowed cervical interspaces and endplate hypertrophic changes. Degenerative changes throughout the cervical facet joints and at C1 to. No vertebral compression deformities. No prevertebral soft tissue swelling. No focal bone lesion or bone destruction. Vascular calcifications in the cervical carotid arteries.  IMPRESSION: No acute intracranial abnormalities. Chronic atrophy and small vessel ischemic changes.  Degenerative changes in the cervical spine. Normal alignment. No acute displaced fractures  identified.   Electronically Signed   By: Burman Nieves M.D.   On: 06/26/2015 00:16   Ct Cervical Spine Wo Contrast  06/26/2015   CLINICAL DATA:  S/P unwitnessed fall. Pt has minor laceration on posterior portion of head no more than 1.5 cm in length with minimal bleeding  EXAM: CT HEAD WITHOUT CONTRAST  CT CERVICAL SPINE WITHOUT CONTRAST  TECHNIQUE: Multidetector CT imaging of the head and cervical spine was performed following the standard protocol without intravenous contrast. Multiplanar CT image reconstructions of the cervical spine were also generated.  COMPARISON:  CT head 05/17/2015. MRI brain 11/01/2014. CT head and cervical spine 10/31/2014.  FINDINGS: CT HEAD FINDINGS  Diffuse cerebral atrophy. Mild ventricular dilatation consistent with central atrophy. Low-attenuation changes in the deep white matter consistent with small vessel ischemia. Subcutaneous scalp hematoma over the left posterior parietal region. No mass effect or midline shift. No abnormal extra-axial fluid collections. Gray-white matter junctions are distinct. Basal cisterns are not effaced. No evidence of acute intracranial hemorrhage. No depressed skull fractures. Mild mucosal thickening in the paranasal sinuses. No acute air-fluid levels. Mastoid air cells are not opacified.  CT CERVICAL SPINE FINDINGS  Normal alignment of the cervical spine. Multilevel degenerative changes with narrowed cervical interspaces and endplate hypertrophic changes. Degenerative changes throughout the cervical facet joints and at  C1 to. No vertebral compression deformities. No prevertebral soft tissue swelling. No focal bone lesion or bone destruction. Vascular calcifications in the cervical carotid arteries.  IMPRESSION: No acute intracranial abnormalities. Chronic atrophy and small vessel ischemic changes.  Degenerative changes in the cervical spine. Normal alignment. No acute displaced fractures identified.   Electronically Signed   By: Burman Nieves  M.D.   On: 06/26/2015 00:16   I have personally reviewed and evaluated these images and lab results as part of my medical decision-making.   EKG Interpretation None     Patient discussed with Dr. Patria Mane. Unwitnessed fall at nursing home. Patient awake, alert. Small laceration to scalp. Staple closure. Radiology results reviewed. Patient discharged back to nursing facility. Wound care instructions provided.  MDM   Final diagnoses:  None   Fall at nursing home. Scalp laceration.    Felicie Morn, NP 06/26/15 0107  Azalia Bilis, MD 06/27/15 [phone]

## 2015-06-26 NOTE — Discharge Instructions (Signed)
Laceration Care, Adult °A laceration is a cut or lesion that goes through all layers of the skin and into the tissue just beneath the skin. °TREATMENT  °Some lacerations may not require closure. Some lacerations may not be able to be closed due to an increased risk of infection. It is important to see your caregiver as soon as possible after an injury to minimize the risk of infection and maximize the opportunity for successful closure. °If closure is appropriate, pain medicines may be given, if needed. The wound will be cleaned to help prevent infection. Your caregiver will use stitches (sutures), staples, wound glue (adhesive), or skin adhesive strips to repair the laceration. These tools bring the skin edges together to allow for faster healing and a better cosmetic outcome. However, all wounds will heal with a scar. Once the wound has healed, scarring can be minimized by covering the wound with sunscreen during the day for 1 full year. °HOME CARE INSTRUCTIONS  °For sutures or staples: °· Keep the wound clean and dry. °· If you were given a bandage (dressing), you should change it at least once a day. Also, change the dressing if it becomes wet or dirty, or as directed by your caregiver. °· Wash the wound with soap and water 2 times a day. Rinse the wound off with water to remove all soap. Pat the wound dry with a clean towel. °· After cleaning, apply a thin layer of the antibiotic ointment as recommended by your caregiver. This will help prevent infection and keep the dressing from sticking. °· You may shower as usual after the first 24 hours. Do not soak the wound in water until the sutures are removed. °· Only take over-the-counter or prescription medicines for pain, discomfort, or fever as directed by your caregiver. °· Get your sutures or staples removed as directed by your caregiver. °For skin adhesive strips: °· Keep the wound clean and dry. °· Do not get the skin adhesive strips wet. You may bathe  carefully, using caution to keep the wound dry. °· If the wound gets wet, pat it dry with a clean towel. °· Skin adhesive strips will fall off on their own. You may trim the strips as the wound heals. Do not remove skin adhesive strips that are still stuck to the wound. They will fall off in time. °For wound adhesive: °· You may briefly wet your wound in the shower or bath. Do not soak or scrub the wound. Do not swim. Avoid periods of heavy perspiration until the skin adhesive has fallen off on its own. After showering or bathing, gently pat the wound dry with a clean towel. °· Do not apply liquid medicine, cream medicine, or ointment medicine to your wound while the skin adhesive is in place. This may loosen the film before your wound is healed. °· If a dressing is placed over the wound, be careful not to apply tape directly over the skin adhesive. This may cause the adhesive to be pulled off before the wound is healed. °· Avoid prolonged exposure to sunlight or tanning lamps while the skin adhesive is in place. Exposure to ultraviolet light in the first year will darken the scar. °· The skin adhesive will usually remain in place for 5 to 10 days, then naturally fall off the skin. Do not pick at the adhesive film. °You may need a tetanus shot if: °· You cannot remember when you had your last tetanus shot. °· You have never had a tetanus   shot. If you get a tetanus shot, your arm may swell, get red, and feel warm to the touch. This is common and not a problem. If you need a tetanus shot and you choose not to have one, there is a rare chance of getting tetanus. Sickness from tetanus can be serious. SEEK MEDICAL CARE IF:   You have redness, swelling, or increasing pain in the wound.  You see a red line that goes away from the wound.  You have yellowish-white fluid (pus) coming from the wound.  You have a fever.  You notice a bad smell coming from the wound or dressing.  Your wound breaks open before or  after sutures have been removed.  You notice something coming out of the wound such as wood or glass.  Your wound is on your hand or foot and you cannot move a finger or toe. SEEK IMMEDIATE MEDICAL CARE IF:   Your pain is not controlled with prescribed medicine.  You have severe swelling around the wound causing pain and numbness or a change in color in your arm, hand, leg, or foot.  Your wound splits open and starts bleeding.  You have worsening numbness, weakness, or loss of function of any joint around or beyond the wound.  You develop painful lumps near the wound or on the skin anywhere on your body. MAKE SURE YOU:   Understand these instructions.  Will watch your condition.  Will get help right away if you are not doing well or get worse. Document Released: 09/18/2005 Document Revised: 12/11/2011 Document Reviewed: 03/14/2011 [facility] Patient Information 2015 [place], [place]. This information is not intended to replace advice given to you by your health care provider. Make sure you discuss any questions you have with your health care provider.  STAPLE REMOVAL IN 5-7 DAYS.

## 2015-07-21 ENCOUNTER — Encounter: Payer: Self-pay | Admitting: Adult Health

## 2015-07-21 NOTE — Progress Notes (Signed)
Patient ID: Kristie Gonzales, female   DOB: 03/06/1926, 81 y.o.   MRN: [id number]    [facility]      Allergies  Allergen Reactions  . Sulfonamide Derivatives Other (See Comments)    Unknown allergic reaction    Chief Complaint  Patient presents with  . Medical Management of Chronic Issues    HPI:  She is a long term resident of this facility being seen for the management of her chronic illnesses. Overall her status remains without significant change. She is unable to fully participate in the hpi or ros. There are no nursing concerns at this time.    Past Medical History  Diagnosis Date  . Thyroid disease   . Hypertension   . Pneumonia 04/19/12  . Hypothyroidism   . Dementia     Past Surgical History  Procedure Laterality Date  . Abdominal hysterectomy    . Appendectomy      VITAL SIGNS BP 125/76 mmHg  Pulse 78  Ht 5\' 2"  (1.575 m)  Wt 176 lb (79.833 kg)  BMI 32.18 kg/m2  SpO2 95%  Patient's Medications  New Prescriptions   No medications on file  Previous Medications   ASPIRIN 325 MG TABLET    Take 1 tablet (325 mg total) by mouth daily.   ATORVASTATIN (LIPITOR) 40 MG TABLET    Take 1 tablet (40 mg total) by mouth daily at 6 PM.   ESCITALOPRAM (LEXAPRO) 5 MG TABLET    Take 1 tablet (5 mg total) by mouth daily.   LEVOTHYROXINE (SYNTHROID, LEVOTHROID) 100 MCG TABLET    Take 100 mcg by mouth daily.   LORATADINE (CLARITIN) 10 MG TABLET    Take 10 mg by mouth daily.   MAGNESIUM HYDROXIDE (MILK OF MAGNESIA) 400 MG/5ML SUSPENSION    Take 30 mLs by mouth daily as needed for mild constipation or moderate constipation.   MEMANTINE (NAMENDA XR) 28 MG CP24 24 HR CAPSULE    Take 1 capsule (28 mg total) by mouth daily.   NUTRITIONAL SUPPLEMENTS (NUTRITIONAL SUPPLEMENT PO)    Take 60 mLs by mouth 3 (three) times daily.   PROPYLENE GLYCOL 0.6 % SOLN    Place 1 drop into both eyes 3 (three) times daily.   Modified Medications   No medications on file    Discontinued Medications   No medications on file     SIGNIFICANT DIAGNOSTIC EXAMS   10-31-14: ct of head and cervical spine: 1. No acute intracranial pathology. 2. No acute osseous injury of the cervical spine.  10-31-14: right elbow x-ray: Small spur arising from the medial distal humeral condyle. No fracture or dislocation. No appreciable joint effusion.  11-01-14; mri/mra of head: MRI HEAD: No acute intracranial process, specifically no evidence of acute ischemia. Involutional changes. Moderate white matter changes can be seen with chronic small vessel ischemic disease. Tiny stable T2 hyperintensities in the basal ganglia suggest perivascular spaces and/or lacunar infarcts. MRA HEAD: No high-grade stenosis or large vessel occlusion. Mild luminal irregularity of the anterior and posterior circulation most consistent with atherosclerosis.  11-02-14: 2-d echo: Left ventricle: The cavity size was normal. Wall thickness was normal. Systolic function was vigorous. The estimated ejection fraction was in the range of 65% to 70%. Wall motion was normal. there were no regional wall motion abnormalities. Features are consistent with a pseudonormal left ventricular filling pattern, with concomitant abnormal relaxation and increased filling pressure (grade 2 diastolic dysfunction).  11-02-14: carotid doppler: Bilateral: mild mixed plaque  CCA and origin ICA. 1-39% ICA stenosis. Vertebral artery flow is antegrade.  03-05-15: kub: negative intestinal gas pattern   04-09-15: lumbar spine x-ray; no acute fracture; degenerative scoliosis lateral to the left     LABS REVIEWED:   10-31-14: wbc 11.7; hgb 14.9; hct 43.1; mcv 90.7 plt2 76; glucose 138; bun 27; creat 2.28; k+3.4; na++142; liver normal albumin 3.9; hgb a1c 5.9 11-01-14: wbc 9.6; hgb 14.8; hct 43.0; mcv 91.3; plt 284; glucose 103; bun 2;1 creat 1.79; k+4.1 ;na++138; ck 233; chol 207; ldl 136; trig 128; hdl 45; tsh 0.522 04-06-15: wbc 8.6; hgb 14.6; hct  46.0; mcv 90.9; plt 293; glucose 102; bun 32.2; creat 1.25; k+4.2; na++142; liver normal albumin 4.3 tsh 3.03 hgb a1c 6.0; chol 144; ldl 72; trig 101; hdl 52     Review of Systems Unable to perform ROS: Dementia    Physical Exam Constitutional: No distress.  Eyes: Conjunctivae are normal.  Neck: Neck supple. No JVD present. No thyromegaly present.  Cardiovascular: Normal rate, regular rhythm and intact distal pulses.   Respiratory: Effort normal and breath sounds normal. No respiratory distress. She has no wheezes.  GI: Soft. Bowel sounds are normal. She exhibits no distension. There is no tenderness.  Musculoskeletal: She exhibits no edema.  Able to move all extremities   Lymphadenopathy:    She has no cervical adenopathy.  Neurological: She is alert.  Skin: Skin is warm and dry. She is not diaphoretic.  Psychiatric: She has a normal mood and affect.     ASSESSMENT/ PLAN:  1. Dyslipidemia: will continue lipitor 40 mg daily her ldl is 72  2. Hypothyroidism: will continue synthroid 100 mcg daily; her tsh is 3.03  3. Allergic rhinitis: will continue claritin 10 mg daily  4. TIA: is neurologically stable; will continue asa 325 mg daily   5. Vascular dementia: no significant change in her status; will continue namenda xr 28 mg daily will monitor  6. Depression: is stable will continue lexapro 5 mg daily   7. CKD stage III: is stable her creat is 1.25; will monitor     Synthia Innocent NP [facility]  Contact [phone] Monday through Friday 8am- 5pm  After hours call [phone]

## 2015-07-29 ENCOUNTER — Encounter: Payer: Self-pay | Admitting: Internal Medicine

## 2015-07-29 ENCOUNTER — Non-Acute Institutional Stay (SKILLED_NURSING_FACILITY): Payer: Medicare HMO | Admitting: Internal Medicine

## 2015-07-29 DIAGNOSIS — F0280 Dementia in other diseases classified elsewhere without behavioral disturbance: Secondary | ICD-10-CM

## 2015-07-29 DIAGNOSIS — E034 Atrophy of thyroid (acquired): Secondary | ICD-10-CM

## 2015-07-29 DIAGNOSIS — F329 Major depressive disorder, single episode, unspecified: Secondary | ICD-10-CM

## 2015-07-29 DIAGNOSIS — E785 Hyperlipidemia, unspecified: Secondary | ICD-10-CM | POA: Diagnosis not present

## 2015-07-29 DIAGNOSIS — F0150 Vascular dementia without behavioral disturbance: Secondary | ICD-10-CM | POA: Diagnosis not present

## 2015-07-29 DIAGNOSIS — N183 Chronic kidney disease, stage 3 (moderate): Secondary | ICD-10-CM

## 2015-07-29 DIAGNOSIS — J302 Other seasonal allergic rhinitis: Secondary | ICD-10-CM

## 2015-07-29 DIAGNOSIS — E038 Other specified hypothyroidism: Secondary | ICD-10-CM

## 2015-07-29 DIAGNOSIS — F0393 Unspecified dementia, unspecified severity, with mood disturbance: Secondary | ICD-10-CM

## 2015-07-29 NOTE — Assessment & Plan Note (Signed)
Stable with Cr 1.25;plan monitor at intervals

## 2015-07-29 NOTE — Progress Notes (Signed)
MRN: [id number] Name: Kristie Gonzales  Sex: female Age: 81 y.o. DOB: 1926/03/23  [facility] #: [facility] Facility/Room:223 Level Of Care: SNF Provider: Merrilee Seashore D Emergency Contacts: Extended Emergency Contact Information Primary Emergency Contact: [name] [place] Home Phone: [phone] Relation: Daughter  Code Status:   Allergies: Sulfonamide derivatives  Chief Complaint  Patient presents with  . Medical Management of Chronic Issues    HPI: Patient is 81 y.o. female with HTN,dementia, hypothyroid, depression, HLD, frequent falls being seen today for routine issues.  Past Medical History  Diagnosis Date  . Thyroid disease   . Hypertension   . Pneumonia 04/19/12  . Hypothyroidism   . Dementia     Past Surgical History  Procedure Laterality Date  . Abdominal hysterectomy    . Appendectomy        Medication List       This list is accurate as of: 07/29/15  4:03 PM.  Always use your most recent med list.               aspirin 325 MG tablet  Take 1 tablet (325 mg total) by mouth daily.     atorvastatin 40 MG tablet  Commonly known as:  LIPITOR  Take 1 tablet (40 mg total) by mouth daily at 6 PM.     escitalopram 5 MG tablet  Commonly known as:  LEXAPRO  Take 1 tablet (5 mg total) by mouth daily.     levothyroxine 100 MCG tablet  Commonly known as:  SYNTHROID, LEVOTHROID  Take 100 mcg by mouth daily.     loratadine 10 MG tablet  Commonly known as:  CLARITIN  Take 10 mg by mouth daily.     memantine 28 MG Cp24 24 hr capsule  Commonly known as:  NAMENDA XR  Take 1 capsule (28 mg total) by mouth daily.     MILK OF MAGNESIA 400 MG/5ML suspension  Generic drug:  magnesium hydroxide  Take 30 mLs by mouth daily as needed for mild constipation or moderate constipation.     NUTRITIONAL SUPPLEMENT PO  Take 60 mLs by mouth 3 (three) times daily.     Propylene Glycol 0.6 % Soln  Place 1 drop into both eyes 3 (three) times daily.         No orders of the defined types were placed in this encounter.    Immunization History  Administered Date(s) Administered  . PPD Test 11/03/2014    Social History  Substance Use Topics  . Smoking status: Never Smoker   . Smokeless tobacco: Not on file  . Alcohol Use: No    Review of Systems  DATA OBTAINED: from nurse- no concerns GENERAL:  no fevers, fatigue, appetite changes SKIN: No itching, rash HEENT: No complaint RESPIRATORY: No cough, wheezing, SOB CARDIAC: No chest pain, palpitations, lower extremity edema  GI: No abdominal pain, No N/V/D or constipation, No heartburn or reflux  GU: No dysuria, frequency or urgency, or incontinence  MUSCULOSKELETAL: No unrelieved bone/joint pain NEUROLOGIC: No headache, dizziness  PSYCHIATRIC: No overt anxiety or sadness  Filed Vitals:   07/29/15 1552  BP: 121/68  Pulse: 76  Temp: 97.2 F (36.2 C)  Resp: 18    Physical Exam  GENERAL APPEARANCE: Alert,non  Conversant,WF,  No acute distress  SKIN: No diaphoresis rash HEENT: Unremarkable RESPIRATORY: Breathing is even, unlabored. Lung sounds are clear   CARDIOVASCULAR: Heart RRR no murmurs, rubs or gallops. No peripheral edema  GASTROINTESTINAL: Abdomen is soft,  non-tender, not distended w/ normal bowel sounds.  GENITOURINARY: Bladder non tender, not distended  MUSCULOSKELETAL: No abnormal joints or musculature NEUROLOGIC: Cranial nerves 2-12 grossly intact. Moves all extremities PSYCHIATRIC: dementia, no behavioral issues  Patient Active Problem List   Diagnosis Date Noted  . Hyperlipidemia 07/29/2015  . Vascular dementia without behavioral disturbance 05/11/2015  . Depression due to dementia 05/11/2015  . Allergic rhinitis 11/26/2014  . TIA (transient ischemic attack) 10/31/2014  . Hypothyroidism 10/31/2014  . CKD (chronic kidney disease) 10/31/2014  . Essential hypertension, benign 07/04/2010    CBC    Component Value Date/Time   WBC 9.6 11/01/2014  0405   WBC 12.3* 05/12/2014 1334   RBC 4.71 11/01/2014 0405   RBC 4.92 05/12/2014 1334   HGB 14.8 11/01/2014 0405   HGB 14.8 05/12/2014 1334   HCT 43.0 11/01/2014 0405   HCT 46.1 05/12/2014 1334   PLT 284 11/01/2014 0405   MCV 91.3 11/01/2014 0405   MCV 93.8 05/12/2014 1334   LYMPHSABS 1.1 10/31/2014 1751   MONOABS 0.9 10/31/2014 1751   EOSABS 0.3 10/31/2014 1751   BASOSABS 0.0 10/31/2014 1751    CMP     Component Value Date/Time   NA 138 11/01/2014 0405   K 4.1 11/01/2014 0405   CL 104 11/01/2014 0405   CO2 26 11/01/2014 0405   GLUCOSE 103* 11/01/2014 0405   BUN 21 11/01/2014 0405   CREATININE 1.79* 11/01/2014 0405   CALCIUM 9.0 11/01/2014 0405   PROT 6.8 10/31/2014 1751   ALBUMIN 3.9 10/31/2014 1751   AST 22 10/31/2014 1751   ALT 10 10/31/2014 1751   ALKPHOS 71 10/31/2014 1751   BILITOT 0.6 10/31/2014 1751   GFRNONAA 25* 11/01/2014 0405   GFRAA 28* 11/01/2014 0405    Assessment and Plan  Vascular dementia without behavioral disturbance Chronic withut recent decline;plan cont namenda xr 28 mg  Depression due to dementia Stable on lexapro ; plan cont lexapro 5 mg daily  CKD (chronic kidney disease) Stable with Cr 1.25;plan monitor at intervals  Hyperlipidemia In 04/2015 LDL 72, HDL 52 on lipitor;plan cont lipitor 40 mg daily  Allergic rhinitis Chronic and stable on daily claritin;plan -cont claritin 10 mg    Margit HanksALEXANDER, Charese Abundis D, MD

## 2015-07-29 NOTE — Assessment & Plan Note (Signed)
In 04/2015 LDL 72, HDL 52 on lipitor;plan cont lipitor 40 mg daily

## 2015-07-29 NOTE — Assessment & Plan Note (Signed)
Chronic withut recent decline;plan cont namenda xr 28 mg

## 2015-07-29 NOTE — Assessment & Plan Note (Signed)
Chronic and stable on daily claritin;plan -cont claritin 10 mg

## 2015-07-29 NOTE — Assessment & Plan Note (Signed)
Stable on lexapro ; plan cont lexapro 5 mg daily

## 2015-09-01 ENCOUNTER — Encounter: Payer: Self-pay | Admitting: Adult Health

## 2015-09-01 ENCOUNTER — Non-Acute Institutional Stay (SKILLED_NURSING_FACILITY): Payer: Medicare HMO | Admitting: Adult Health

## 2015-09-01 DIAGNOSIS — E034 Atrophy of thyroid (acquired): Secondary | ICD-10-CM | POA: Diagnosis not present

## 2015-09-01 DIAGNOSIS — G459 Transient cerebral ischemic attack, unspecified: Secondary | ICD-10-CM

## 2015-09-01 DIAGNOSIS — F329 Major depressive disorder, single episode, unspecified: Secondary | ICD-10-CM | POA: Diagnosis not present

## 2015-09-01 DIAGNOSIS — E038 Other specified hypothyroidism: Secondary | ICD-10-CM | POA: Diagnosis not present

## 2015-09-01 DIAGNOSIS — N183 Chronic kidney disease, stage 3 (moderate): Secondary | ICD-10-CM | POA: Diagnosis not present

## 2015-09-01 DIAGNOSIS — F0150 Vascular dementia without behavioral disturbance: Secondary | ICD-10-CM

## 2015-09-01 DIAGNOSIS — J302 Other seasonal allergic rhinitis: Secondary | ICD-10-CM | POA: Diagnosis not present

## 2015-09-01 DIAGNOSIS — E785 Hyperlipidemia, unspecified: Secondary | ICD-10-CM

## 2015-09-01 DIAGNOSIS — F0280 Dementia in other diseases classified elsewhere without behavioral disturbance: Secondary | ICD-10-CM

## 2015-09-01 DIAGNOSIS — F0393 Unspecified dementia, unspecified severity, with mood disturbance: Secondary | ICD-10-CM

## 2015-09-01 NOTE — Progress Notes (Signed)
Patient ID: Kristie Gonzales, female   DOB: 1925/10/14, 81 y.o.   MRN: [id number]   Facility: [facility]      Allergies  Allergen Reactions  . Sulfonamide Derivatives Other (See Comments)    Unknown allergic reaction    Chief Complaint  Patient presents with  . Medical Management of Chronic Issues    HPI:  She is a long term resident of this facility being seen for the management of her chronic illnesses. Overall there is little change in her status. She is aware of her surroundings; but does not speak. She is unable to participate in the hpi or ros. There are no nursing concerns at this time.     Past Medical History  Diagnosis Date  . Thyroid disease   . Hypertension   . Pneumonia 04/19/12  . Hypothyroidism   . Dementia     Past Surgical History  Procedure Laterality Date  . Abdominal hysterectomy    . Appendectomy      VITAL SIGNS BP 119/70 mmHg  Pulse 70  Ht  (1.575 m)  Wt 175 lb (79.379 kg)  BMI 32.00 kg/m2  SpO2 97%  Patient's Medications  New Prescriptions   No medications on file  Previous Medications   ASPIRIN 325 MG TABLET    Take 1 tablet (325 mg total) by mouth daily.   ATORVASTATIN (LIPITOR) 40 MG TABLET    Take 1 tablet (40 mg total) by mouth daily at 6 PM.   ESCITALOPRAM (LEXAPRO) 5 MG TABLET    Take 1 tablet (5 mg total) by mouth daily.   LEVOTHYROXINE (SYNTHROID, LEVOTHROID) 100 MCG TABLET    Take 100 mcg by mouth daily.   LORATADINE (CLARITIN) 10 MG TABLET    Take 10 mg by mouth daily.   MAGNESIUM HYDROXIDE (MILK OF MAGNESIA) 400 MG/5ML SUSPENSION    Take 30 mLs by mouth daily as needed for mild constipation or moderate constipation.   MEMANTINE (NAMENDA XR) 28 MG CP24 24 HR CAPSULE    Take 1 capsule (28 mg total) by mouth daily.   NUTRITIONAL SUPPLEMENTS (NUTRITIONAL SUPPLEMENT PO)    Take 60 mLs by mouth 3 (three) times daily.   PROPYLENE GLYCOL 0.6 % SOLN    Place 1 drop into both eyes 3 (three) times daily.   Modified Medications     No medications on file  Discontinued Medications   No medications on file     SIGNIFICANT DIAGNOSTIC EXAMS   10-31-14: ct of head and cervical spine: 1. No acute intracranial pathology. 2. No acute osseous injury of the cervical spine.  10-31-14: right elbow x-ray: Small spur arising from the medial distal humeral condyle. No fracture or dislocation. No appreciable joint effusion.  11-01-14; mri/mra of head: MRI HEAD: No acute intracranial process, specifically no evidence of acute ischemia. Involutional changes. Moderate white matter changes can be seen with chronic small vessel ischemic disease. Tiny stable T2 hyperintensities in the basal ganglia suggest perivascular spaces and/or lacunar infarcts. MRA HEAD: No high-grade stenosis or large vessel occlusion. Mild luminal irregularity of the anterior and posterior circulation most consistent with atherosclerosis.  11-02-14: 2-d echo: Left ventricle: The cavity size was normal. Wall thickness was normal. Systolic function was vigorous. The estimated ejection fraction was in the range of 65% to 70%. Wall motion was normal. there were no regional wall motion abnormalities. Features are consistent with a pseudonormal left ventricular filling pattern, with concomitant abnormal relaxation and increased filling pressure (grade 2 diastolic  dysfunction).  11-02-14: carotid doppler: Bilateral: mild mixed plaque CCA and origin ICA. 1-39% ICA stenosis. Vertebral artery flow is antegrade.  03-05-15: kub: negative intestinal gas pattern   04-09-15: lumbar spine x-ray; no acute fracture; degenerative scoliosis lateral to the left     LABS REVIEWED:   10-31-14: wbc 11.7; hgb 14.9; hct 43.1; mcv 90.7 plt2 76; glucose 138; bun 27; creat 2.28; k+3.4; na++142; liver normal albumin 3.9; hgb a1c 5.9 11-01-14: wbc 9.6; hgb 14.8; hct 43.0; mcv 91.3; plt 284; glucose 103; bun 2;1 creat 1.79; k+4.1 ;na++138; ck 233; chol 207; ldl 136; trig 128; hdl 45; tsh 0.522 04-06-15:  wbc 8.6; hgb 14.6; hct 46.0; mcv 90.9; plt 293; glucose 102; bun 32.2; creat 1.25; k+4.2; na++142; liver normal albumin 4.3 tsh 3.03 hgb a1c 6.0; chol 144; ldl 72; trig 101; hdl 52     Review of Systems Unable to perform ROS: Dementia    Physical Exam Constitutional: No distress.  Eyes: Conjunctivae are normal.  Neck: Neck supple. No JVD present. No thyromegaly present.  Cardiovascular: Normal rate, regular rhythm and intact distal pulses.   Respiratory: Effort normal and breath sounds normal. No respiratory distress. She has no wheezes.  GI: Soft. Bowel sounds are normal. She exhibits no distension. There is no tenderness.  Musculoskeletal: She exhibits no edema.  Able to move all extremities   Lymphadenopathy:    She has no cervical adenopathy.  Neurological: She is alert.  Skin: Skin is warm and dry. She is not diaphoretic.  Psychiatric: She has a normal mood and affect.     ASSESSMENT/ PLAN:  1. Dyslipidemia: will continue lipitor 40 mg daily her ldl is 72  2. Hypothyroidism: will continue synthroid 100 mcg daily; her tsh is 3.03  3. Allergic rhinitis: will continue claritin 10 mg daily  4. TIA: is neurologically stable; will continue asa 325 mg daily   5. Vascular dementia: no significant change in her status; will continue namenda xr 28 mg daily will monitor  6. Depression: is stable will continue lexapro 5 mg daily   7. CKD stage III: is stable her creat is 1.25; will monitor   Will check cbc; cmp; lipids      Synthia Innocenteborah Keimani Laufer NP [facility]  Contact [phone] Monday through Friday 8am- 5pm  After hours call [phone]

## 2015-09-02 LAB — BASIC METABOLIC PANEL
BUN: 27 mg/dL — AB (ref 4–21)
Creatinine: 1.5 mg/dL — AB (ref 0.5–1.1)
GLUCOSE: 133 mg/dL
Potassium: 4.8 mmol/L (ref 3.4–5.3)
Sodium: 142 mmol/L (ref 137–147)

## 2015-09-02 LAB — HEPATIC FUNCTION PANEL
ALT: 14 U/L (ref 7–35)
AST: 22 U/L (ref 13–35)
Alkaline Phosphatase: 118 U/L (ref 25–125)
BILIRUBIN, TOTAL: 0.5 mg/dL

## 2015-09-30 ENCOUNTER — Non-Acute Institutional Stay (SKILLED_NURSING_FACILITY): Payer: Medicare HMO | Admitting: Internal Medicine

## 2015-09-30 ENCOUNTER — Encounter: Payer: Self-pay | Admitting: Internal Medicine

## 2015-09-30 DIAGNOSIS — F0150 Vascular dementia without behavioral disturbance: Secondary | ICD-10-CM

## 2015-09-30 DIAGNOSIS — N183 Chronic kidney disease, stage 3 unspecified: Secondary | ICD-10-CM

## 2015-09-30 DIAGNOSIS — F329 Major depressive disorder, single episode, unspecified: Secondary | ICD-10-CM

## 2015-09-30 DIAGNOSIS — F0280 Dementia in other diseases classified elsewhere without behavioral disturbance: Secondary | ICD-10-CM | POA: Diagnosis not present

## 2015-09-30 DIAGNOSIS — F0393 Unspecified dementia, unspecified severity, with mood disturbance: Secondary | ICD-10-CM

## 2015-09-30 NOTE — Assessment & Plan Note (Signed)
Stable on lexapro ; plan cont lexapro 5 mg daily 

## 2015-09-30 NOTE — Progress Notes (Signed)
MRN: [id number] Name: Kristie Gonzales  Sex: female Age: 81 y.o. DOB: 04/17/1926  [facility] Facility[facility] Level Of Care: SNF Provider: Merrilee SeashoreALEXANDER, Leaman Abe D Emergency Contacts: Extended Emergency Contact Information Primary Emergency Contact: Coralyn PearHarris,June  [place] Home Phone: [phone] Relation: Daughter  Code Status:   Allergies: Sulfonamide derivatives  Chief Complaint  Patient presents with  . Medical Management of Chronic Issues    HPI: Patient is 81 y.o. female with HTN,dementia, hypothyroid, depression, HLD, frequent falls being seen today for routine issues ofCKD3, depression and dementia.  Past Medical History  Diagnosis Date  . Thyroid disease   . Hypertension   . Pneumonia 04/19/12  . Hypothyroidism   . Dementia     Past Surgical History  Procedure Laterality Date  . Abdominal hysterectomy    . Appendectomy        Medication List       This list is accurate as of: 09/30/15  8:50 PM.  Always use your most recent med list.               aspirin 325 MG tablet  Take 1 tablet (325 mg total) by mouth daily.     atorvastatin 40 MG tablet  Commonly known as:  LIPITOR  Take 1 tablet (40 mg total) by mouth daily at 6 PM.     escitalopram 5 MG tablet  Commonly known as:  LEXAPRO  Take 1 tablet (5 mg total) by mouth daily.     levothyroxine 100 MCG tablet  Commonly known as:  SYNTHROID, LEVOTHROID  Take 100 mcg by mouth daily.     loratadine 10 MG tablet  Commonly known as:  CLARITIN  Take 10 mg by mouth daily.     memantine 28 MG Cp24 24 hr capsule  Commonly known as:  NAMENDA XR  Take 1 capsule (28 mg total) by mouth daily.     MILK OF MAGNESIA 400 MG/5ML suspension  Generic drug:  magnesium hydroxide  Take 30 mLs by mouth daily as needed for mild constipation or moderate constipation.     NUTRITIONAL SUPPLEMENT PO  Take 60 mLs by mouth 3 (three) times daily.     Propylene Glycol 0.6 % Soln  Place 1 drop into  both eyes 3 (three) times daily.        No orders of the defined types were placed in this encounter.    Immunization History  Administered Date(s) Administered  . Influenza-Unspecified 06/16/2014, 07/23/2015  . PPD Test 11/03/2014, 11/17/2014  . Pneumococcal-Unspecified 06/16/2014    Social History  Substance Use Topics  . Smoking status: Never Smoker   . Smokeless tobacco: Not on file  . Alcohol Use: No    Review of Systems  DATA OBTAINED: from nurse GENERAL:  no fevers, fatigue, appetite changes SKIN: No itching, rash HEENT: No complaint RESPIRATORY: No cough, wheezing, SOB CARDIAC: No chest pain, palpitations, lower extremity edema  GI: No abdominal pain, No N/V/D or constipation, No heartburn or reflux  GU: No dysuria, frequency or urgency, or incontinence  MUSCULOSKELETAL: No unrelieved bone/joint pain NEUROLOGIC: No headache, dizziness  PSYCHIATRIC: No overt anxiety or sadness  Filed Vitals:   09/30/15 1317  BP: 119/70  Pulse: 70  Temp: 98 F (36.7 C)  Resp: 18    Physical Exam  GENERAL APPEARANCE: Alert, non conversant, No acute distress  SKIN: No diaphoresis rash HEENT: Unremarkable RESPIRATORY: Breathing is even, unlabored. Lung sounds are clear   CARDIOVASCULAR: Heart RRR no murmurs,  rubs or gallops. No peripheral edema  GASTROINTESTINAL: Abdomen is soft, non-tender, not distended w/ normal bowel sounds.  GENITOURINARY: Bladder non tender, not distended  MUSCULOSKELETAL: No abnormal joints or musculature NEUROLOGIC: Cranial nerves 2-12 grossly intact. Moves all extremities PSYCHIATRIC: dementia, no behavioral issues  Patient Active Problem List   Diagnosis Date Noted  . Hyperlipidemia 07/29/2015  . Vascular dementia without behavioral disturbance 05/11/2015  . Depression due to dementia 05/11/2015  . Allergic rhinitis 11/26/2014  . TIA (transient ischemic attack) 10/31/2014  . Hypothyroidism 10/31/2014  . CKD (chronic kidney disease) stage  3, GFR 30-59 ml/min 10/31/2014  . Essential hypertension, benign 07/04/2010    CBC    Component Value Date/Time   WBC 9.6 11/01/2014 0405   WBC 12.3* 05/12/2014 1334   RBC 4.71 11/01/2014 0405   RBC 4.92 05/12/2014 1334   HGB 14.8 11/01/2014 0405   HGB 14.8 05/12/2014 1334   HCT 43.0 11/01/2014 0405   HCT 46.1 05/12/2014 1334   PLT 284 11/01/2014 0405   MCV 91.3 11/01/2014 0405   MCV 93.8 05/12/2014 1334   LYMPHSABS 1.1 10/31/2014 1751   MONOABS 0.9 10/31/2014 1751   EOSABS 0.3 10/31/2014 1751   BASOSABS 0.0 10/31/2014 1751    CMP     Component Value Date/Time   NA 138 11/01/2014 0405   K 4.1 11/01/2014 0405   CL 104 11/01/2014 0405   CO2 26 11/01/2014 0405   GLUCOSE 103* 11/01/2014 0405   BUN 21 11/01/2014 0405   CREATININE 1.79* 11/01/2014 0405   CALCIUM 9.0 11/01/2014 0405   PROT 6.8 10/31/2014 1751   ALBUMIN 3.9 10/31/2014 1751   AST 22 10/31/2014 1751   ALT 10 10/31/2014 1751   ALKPHOS 71 10/31/2014 1751   BILITOT 0.6 10/31/2014 1751   GFRNONAA 25* 11/01/2014 0405   GFRAA 28* 11/01/2014 0405    Assessment and Plan  CKD (chronic kidney disease) stage 3, GFR 30-59 ml/min GFR 33.9; Cr 1.54 12 09/2015; slt decline 2/2 aging;plan monitor at intervals  Depression due to dementia Stable on lexapro ; plan cont lexapro 5 mg daily  Vascular dementia without behavioral disturbance Chronic and progressive ;plan - conr namenda XR 28 mg daily    Margit Hanks, MD

## 2015-09-30 NOTE — Assessment & Plan Note (Signed)
GFR 33.9; Cr 1.54 12 09/2015; slt decline 2/2 aging;plan monitor at intervals

## 2015-09-30 NOTE — Assessment & Plan Note (Signed)
Chronic and progressive ;plan - conr namenda XR 28 mg daily

## 2015-10-09 ENCOUNTER — Encounter: Payer: Self-pay | Admitting: Internal Medicine

## 2015-11-02 ENCOUNTER — Non-Acute Institutional Stay (SKILLED_NURSING_FACILITY): Payer: Medicare Other | Admitting: Adult Health

## 2015-11-02 DIAGNOSIS — E034 Atrophy of thyroid (acquired): Secondary | ICD-10-CM

## 2015-11-02 DIAGNOSIS — E038 Other specified hypothyroidism: Secondary | ICD-10-CM

## 2015-11-02 DIAGNOSIS — F329 Major depressive disorder, single episode, unspecified: Secondary | ICD-10-CM

## 2015-11-02 DIAGNOSIS — F0280 Dementia in other diseases classified elsewhere without behavioral disturbance: Secondary | ICD-10-CM | POA: Diagnosis not present

## 2015-11-02 DIAGNOSIS — F0393 Unspecified dementia, unspecified severity, with mood disturbance: Secondary | ICD-10-CM

## 2015-11-02 DIAGNOSIS — N183 Chronic kidney disease, stage 3 unspecified: Secondary | ICD-10-CM

## 2015-11-02 DIAGNOSIS — E785 Hyperlipidemia, unspecified: Secondary | ICD-10-CM

## 2015-11-02 DIAGNOSIS — I10 Essential (primary) hypertension: Secondary | ICD-10-CM | POA: Diagnosis not present

## 2015-11-02 DIAGNOSIS — F0150 Vascular dementia without behavioral disturbance: Secondary | ICD-10-CM | POA: Diagnosis not present

## 2015-11-08 ENCOUNTER — Non-Acute Institutional Stay (SKILLED_NURSING_FACILITY): Payer: Medicare Other | Admitting: Internal Medicine

## 2015-11-08 DIAGNOSIS — R21 Rash and other nonspecific skin eruption: Secondary | ICD-10-CM

## 2015-11-08 NOTE — Progress Notes (Signed)
MRN: [id number] Name: Kristie Gonzales  Sex: female Age: 82 y.o. DOB: [birth year]  [facility] Facility/Room:223 Level Of Care: SNF Provider: Merrilee Seashore D Emergency Contacts: Extended Emergency Contact Information Primary Emergency Contact: [name] [place] Home Phone: [phone] Relation: Daughter  Code Status:   Allergies: Sulfonamide derivatives  Chief Complaint  Patient presents with  . Acute Visit    HPI: Patient is 82 y.o. female who nursing asked me to see for a rash on her back and R hip/thigh that she thinks may be shingles. When I got to the pt's room OT tells me the rash has been there a week. A few minutes later this same OT brought the pt to me to show me a lesion on her R knee and another on R thigh, different from the one on her back. Pt denies pain itching or discomfort with any of the places.  Past Medical History  Diagnosis Date  . Thyroid disease   . Hypertension   . Pneumonia 04/19/12  . Hypothyroidism   . Dementia     Past Surgical History  Procedure Laterality Date  . Abdominal hysterectomy    . Appendectomy        Medication List       This list is accurate as of: 11/08/15 11:59 PM.  Always use your most recent med list.               aspirin 325 MG tablet  Take 1 tablet (325 mg total) by mouth daily.     atorvastatin 40 MG tablet  Commonly known as:  LIPITOR  Take 1 tablet (40 mg total) by mouth daily at 6 PM.     escitalopram 5 MG tablet  Commonly known as:  LEXAPRO  Take 1 tablet (5 mg total) by mouth daily.     levothyroxine 100 MCG tablet  Commonly known as:  SYNTHROID, LEVOTHROID  Take 100 mcg by mouth daily.     loratadine 10 MG tablet  Commonly known as:  CLARITIN  Take 10 mg by mouth daily.     memantine 28 MG Cp24 24 hr capsule  Commonly known as:  NAMENDA XR  Take 1 capsule (28 mg total) by mouth daily.     MILK OF MAGNESIA 400 MG/5ML suspension  Generic drug:  magnesium hydroxide   Take 30 mLs by mouth daily as needed for mild constipation or moderate constipation.     NUTRITIONAL SUPPLEMENT PO  Take 60 mLs by mouth 3 (three) times daily.     Propylene Glycol 0.6 % Soln  Place 1 drop into both eyes 3 (three) times daily.        No orders of the defined types were placed in this encounter.    Immunization History  Administered Date(s) Administered  . Influenza-Unspecified 06/16/2014, 07/23/2015  . PPD Test 11/03/2014, 11/17/2014  . Pneumococcal-Unspecified 06/16/2014    Social History  Substance Use Topics  . Smoking status: Never Smoker   . Smokeless tobacco: Not on file  . Alcohol Use: No    Review of Systems  DATA OBTAINED: from patient, nurse,OT GENERAL:  no fevers, fatigue, appetite changes SKIN: rashes as per HPI HEENT: No complaint RESPIRATORY: No cough, wheezing, SOB CARDIAC: No chest pain, palpitations, lower extremity edema  GI: No abdominal pain, No N/V/D or constipation, No heartburn or reflux  GU: No dysuria, frequency or urgency, or incontinence  MUSCULOSKELETAL: No unrelieved bone/joint pain NEUROLOGIC: No headache, dizziness  PSYCHIATRIC: No  overt anxiety or sadness  Filed Vitals:   11/13/15 2121  BP: 124/62  Pulse: 72  Temp: 97 F (36.1 C)  Resp: 18    Physical Exam  GENERAL APPEARANCE: Alert, mod conversant, No acute distress  SKIN: center of low back is flat scaley lesion c/w eczemoid rash, clearly not shingles; R thigh - 2 dark brown rounded lesions , no redness swelling or excoriation; R knee very dry scale and rough lesion over knee without redness or swelling HEENT: Unremarkable RESPIRATORY: Breathing is even, unlabored. Lung sounds are clear   CARDIOVASCULAR: Heart RRR no murmurs, rubs or gallops. No peripheral edema  GASTROINTESTINAL: Abdomen is soft, non-tender, not distended w/ normal bowel sounds.  GENITOURINARY: Bladder non tender, not distended  MUSCULOSKELETAL: No abnormal joints or  musculature NEUROLOGIC: Cranial nerves 2-12 grossly intact. Moves all extremities PSYCHIATRIC: Mood and affect appropriate to situation, no behavioral issues  Patient Active Problem List   Diagnosis Date Noted  . Rash and nonspecific skin eruption 11/13/2015  . Hyperlipidemia 07/29/2015  . Vascular dementia without behavioral disturbance 05/11/2015  . Depression due to dementia 05/11/2015  . Allergic rhinitis 11/26/2014  . TIA (transient ischemic attack) 10/31/2014  . Hypothyroidism 10/31/2014  . CKD (chronic kidney disease) stage 3, GFR 30-59 ml/min 10/31/2014  . Essential hypertension, benign 07/04/2010    CBC    Component Value Date/Time   WBC 9.6 11/01/2014 0405   WBC 12.3* 05/12/2014 1334   RBC 4.71 11/01/2014 0405   RBC 4.92 05/12/2014 1334   HGB 14.8 11/01/2014 0405   HGB 14.8 05/12/2014 1334   HCT 43.0 11/01/2014 0405   HCT 46.1 05/12/2014 1334   PLT 284 11/01/2014 0405   MCV 91.3 11/01/2014 0405   MCV 93.8 05/12/2014 1334   LYMPHSABS 1.1 10/31/2014 1751   MONOABS 0.9 10/31/2014 1751   EOSABS 0.3 10/31/2014 1751   BASOSABS 0.0 10/31/2014 1751    CMP     Component Value Date/Time   NA 138 11/01/2014 0405   K 4.1 11/01/2014 0405   CL 104 11/01/2014 0405   CO2 26 11/01/2014 0405   GLUCOSE 103* 11/01/2014 0405   BUN 21 11/01/2014 0405   CREATININE 1.79* 11/01/2014 0405   CALCIUM 9.0 11/01/2014 0405   PROT 6.8 10/31/2014 1751   ALBUMIN 3.9 10/31/2014 1751   AST 22 10/31/2014 1751   ALT 10 10/31/2014 1751   ALKPHOS 71 10/31/2014 1751   BILITOT 0.6 10/31/2014 1751   GFRNONAA 25* 11/01/2014 0405   GFRAA 28* 11/01/2014 0405    Assessment and Plan  Rash and nonspecific skin eruption Three separate areas and they all look different- #1 back - eczemoid and steroid responsive looking lesion -have ordered triamcinolone cream; #2 brown lesions R thigh - I have no idea they do not look like moles, warts or scabs-will monitor; #3 R knee very very dry scaley thicker  whitish, looks steroid responsive and will start with triamcinolone  Cream and monitor   Time spent . 25 min;> 50% of time with patient was spent reviewing records, labs, tests and studies, counseling and developing plan of care  Margit Hanks, MD

## 2015-11-13 ENCOUNTER — Encounter: Payer: Self-pay | Admitting: Internal Medicine

## 2015-11-13 DIAGNOSIS — R21 Rash and other nonspecific skin eruption: Secondary | ICD-10-CM | POA: Insufficient documentation

## 2015-11-13 NOTE — Assessment & Plan Note (Signed)
Three separate areas and they all look different- #1 back - eczemoid and steroid responsive looking lesion -have ordered triamcinolone cream; #2 brown lesions R thigh - I have no idea they do not look like moles, warts or scabs-will monitor; #3 R knee very very dry scaley thicker whitish, looks steroid responsive and will start with triamcinolone  Cream and monitor

## 2015-12-01 LAB — CBC AND DIFFERENTIAL
HCT: 44 % (ref 36–46)
Hemoglobin: 14.5 g/dL (ref 12.0–16.0)
PLATELETS: 263 10*3/uL (ref 150–399)
WBC: 10.7 10^3/mL

## 2015-12-01 LAB — POCT ERYTHROCYTE SEDIMENTATION RATE, NON-AUTOMATED: SED RATE: 18 mm

## 2015-12-06 ENCOUNTER — Non-Acute Institutional Stay (SKILLED_NURSING_FACILITY): Payer: Medicare Other | Admitting: Internal Medicine

## 2015-12-06 DIAGNOSIS — L0390 Cellulitis, unspecified: Secondary | ICD-10-CM | POA: Insufficient documentation

## 2015-12-06 DIAGNOSIS — R21 Rash and other nonspecific skin eruption: Secondary | ICD-10-CM | POA: Diagnosis not present

## 2015-12-06 DIAGNOSIS — L03113 Cellulitis of right upper limb: Secondary | ICD-10-CM | POA: Diagnosis not present

## 2015-12-06 NOTE — Progress Notes (Signed)
MRN: [id number] Name: Kristie Gonzales  Sex: female Age: 82 y.o. DOB: 03/01/1926  [facility] Facility[facility]: Level Of Care: SNF Provider: Merrilee SeashoreALEXANDER, Keyonia Gluth D Emergency Contacts: Extended Emergency Contact Information Primary Emergency Contact: Coralyn PearHarris,June  [place] Home Phone: [phone] Relation: Daughter  Code Status:   Allergies: Sulfonamide derivatives  Chief Complaint  Patient presents with  . Acute Visit    HPI: Patient is 82 y.o. female who the floor nurse asked me to see for redness and swelling to pt's R arm and leg. She can't tell me how long this has been going on but a nursing supervisor told me that they had been watching her for about 2 weeks. Pt with baseline dementia but no fever or MS change.   Past Medical History  Diagnosis Date  . Thyroid disease   . Hypertension   . Pneumonia 04/19/12  . Hypothyroidism   . Dementia     Past Surgical History  Procedure Laterality Date  . Abdominal hysterectomy    . Appendectomy        Medication List       This list is accurate as of: 12/06/15 11:59 PM.  Always use your most recent med list.               aspirin 325 MG tablet  Take 1 tablet (325 mg total) by mouth daily.     atorvastatin 40 MG tablet  Commonly known as:  LIPITOR  Take 1 tablet (40 mg total) by mouth daily at 6 PM.     escitalopram 5 MG tablet  Commonly known as:  LEXAPRO  Take 1 tablet (5 mg total) by mouth daily.     levothyroxine 100 MCG tablet  Commonly known as:  SYNTHROID, LEVOTHROID  Take 100 mcg by mouth daily.     loratadine 10 MG tablet  Commonly known as:  CLARITIN  Take 10 mg by mouth daily.     memantine 28 MG Cp24 24 hr capsule  Commonly known as:  NAMENDA XR  Take 1 capsule (28 mg total) by mouth daily.     MILK OF MAGNESIA 400 MG/5ML suspension  Generic drug:  magnesium hydroxide  Take 30 mLs by mouth daily as needed for mild constipation or moderate constipation.     NUTRITIONAL  SUPPLEMENT PO  Take 60 mLs by mouth 3 (three) times daily.     Propylene Glycol 0.6 % Soln  Place 1 drop into both eyes 3 (three) times daily.        No orders of the defined types were placed in this encounter.    Immunization History  Administered Date(s) Administered  . Influenza-Unspecified 06/16/2014, 07/23/2015  . PPD Test 11/03/2014, 11/17/2014  . Pneumococcal-Unspecified 06/16/2014    Social History  Substance Use Topics  . Smoking status: Never Smoker   . Smokeless tobacco: Not on file  . Alcohol Use: No    Review of Systems  UTO 2/2 dementia; nursing - per HPI    Filed Vitals:   12/07/15 1709  BP: 124/62  Pulse: 72  Temp: 97 F (36.1 C)  Resp: 18    Physical Exam    GENERAL APPEARANCE: Alert, minconversant, No acute distress  SKIN: L leg with dry skin and some areas that look vaguely vasculitic; R leg with 8 cm by 8 cm area of open individual wounds, with a few bullae in area, weeping, wet with surounding heat and erythema of leg; R forearm near elbow with similar  looking area with a few bullae as well and surrounding heat, redness swelling; R palm with on bulla on it, not infected HEENT: Unremarkable RESPIRATORY: Breathing is even, unlabored. Lung sounds are clear   CARDIOVASCULAR: Heart RRR no murmurs, rubs or gallops. No peripheral edema  GASTROINTESTINAL: Abdomen is soft, non-tender, not distended w/ normal bowel sounds.  GENITOURINARY: Bladder non tender, not distended  MUSCULOSKELETAL: No abnormal joints or musculature NEUROLOGIC: Cranial nerves 2-12 grossly intact. Moves all extremities PSYCHIATRIC: dementia, no behavioral issues   Patient Active Problem List   Diagnosis Date Noted  . Cellulitis 12/06/2015  . Rash and nonspecific skin eruption 11/13/2015  . Hyperlipidemia 07/29/2015  . Vascular dementia without behavioral disturbance 05/11/2015  . Depression due to dementia 05/11/2015  . Allergic rhinitis 11/26/2014  . TIA (transient  ischemic attack) 10/31/2014  . Hypothyroidism 10/31/2014  . CKD (chronic kidney disease) stage 3, GFR 30-59 ml/min 10/31/2014  . Essential hypertension, benign 07/04/2010    CBC    Component Value Date/Time   WBC 9.6 11/01/2014 0405   WBC 12.3* 05/12/2014 1334   RBC 4.71 11/01/2014 0405   RBC 4.92 05/12/2014 1334   HGB 14.8 11/01/2014 0405   HGB 14.8 05/12/2014 1334   HCT 43.0 11/01/2014 0405   HCT 46.1 05/12/2014 1334   PLT 284 11/01/2014 0405   MCV 91.3 11/01/2014 0405   MCV 93.8 05/12/2014 1334   LYMPHSABS 1.1 10/31/2014 1751   MONOABS 0.9 10/31/2014 1751   EOSABS 0.3 10/31/2014 1751   BASOSABS 0.0 10/31/2014 1751    CMP     Component Value Date/Time   NA 138 11/01/2014 0405   K 4.1 11/01/2014 0405   CL 104 11/01/2014 0405   CO2 26 11/01/2014 0405   GLUCOSE 103* 11/01/2014 0405   BUN 21 11/01/2014 0405   CREATININE 1.79* 11/01/2014 0405   CALCIUM 9.0 11/01/2014 0405   PROT 6.8 10/31/2014 1751   ALBUMIN 3.9 10/31/2014 1751   AST 22 10/31/2014 1751   ALT 10 10/31/2014 1751   ALKPHOS 71 10/31/2014 1751   BILITOT 0.6 10/31/2014 1751   GFRNONAA 25* 11/01/2014 0405   GFRAA 28* 11/01/2014 0405    Assessment and Plan  Cellulitis Whatever the underlying skin problem both areas, one each onR arm and lR eg have are infected with surrounding cellulitis; have started doxycycline 100 mg bid for 14 days; will monitor  Rash and nonspecific skin eruption Today with changes that look vaguely vasculitic and obvious bullae next to round open wounds; one bulla on R palm; the surrounding cellulitis can be treated but there needs to be a diagnosis. I know it is not possible to get a dermatologist so will have pt set up with wound care to see Dr Leanord Hawking who can do a biopsy. We have done this once before with good result    Time spent > 35 min;> 50% of time with patient was spent reviewing records, labs, tests and studies, counseling and developing plan of care  Margit Hanks,  MD

## 2015-12-07 ENCOUNTER — Encounter: Payer: Self-pay | Admitting: Internal Medicine

## 2015-12-07 NOTE — Assessment & Plan Note (Signed)
Today with changes that look vaguely vasculitic and obvious bullae next to round open wounds; one bulla on R palm; the surrounding cellulitis can be treated but there needs to be a diagnosis. I know it is not possible to get a dermatologist so will have pt set up with wound care to see Dr Leanord Hawkingobson who can do a biopsy. We have done this once before with good result

## 2015-12-07 NOTE — Assessment & Plan Note (Signed)
Whatever the underlying skin problem both areas, one each onR arm and lR eg have are infected with surrounding cellulitis; have started doxycycline 100 mg bid for 14 days; will monitor

## 2015-12-13 ENCOUNTER — Encounter: Payer: Self-pay | Admitting: Internal Medicine

## 2015-12-13 ENCOUNTER — Non-Acute Institutional Stay (SKILLED_NURSING_FACILITY): Payer: Medicare Other | Admitting: Internal Medicine

## 2015-12-13 DIAGNOSIS — R21 Rash and other nonspecific skin eruption: Secondary | ICD-10-CM | POA: Diagnosis not present

## 2015-12-13 NOTE — Progress Notes (Signed)
MRN: [id number] Name: Kristie Gonzales  Sex: female Age: 82 y.o. DOB: 05/28/1926  PSC #:  Facility/Room: Level Of Care: SNF Provider: Merrilee SeashoreALEXANDER, ANNE D Emergency Contacts: Extended Emergency Contact Information Primary Emergency Contact: Coralyn PearHarris,June  [place] Home Phone: [phone] Relation: Daughter  Code Status:   Allergies: Sulfonamide derivatives  No chief complaint on file.   HPI: Patient is 82 y.o. female who   Past Medical History  Diagnosis Date  . Thyroid disease   . Hypertension   . Pneumonia 04/19/12  . Hypothyroidism   . Dementia     Past Surgical History  Procedure Laterality Date  . Abdominal hysterectomy    . Appendectomy        Medication List       This list is accurate as of: 12/13/15  1:21 PM.  Always use your most recent med list.               aspirin 325 MG tablet  Take 1 tablet (325 mg total) by mouth daily.     atorvastatin 40 MG tablet  Commonly known as:  LIPITOR  Take 1 tablet (40 mg total) by mouth daily at 6 PM.     escitalopram 5 MG tablet  Commonly known as:  LEXAPRO  Take 1 tablet (5 mg total) by mouth daily.     levothyroxine 100 MCG tablet  Commonly known as:  SYNTHROID, LEVOTHROID  Take 100 mcg by mouth daily.     loratadine 10 MG tablet  Commonly known as:  CLARITIN  Take 10 mg by mouth daily.     memantine 28 MG Cp24 24 hr capsule  Commonly known as:  NAMENDA XR  Take 1 capsule (28 mg total) by mouth daily.     MILK OF MAGNESIA 400 MG/5ML suspension  Generic drug:  magnesium hydroxide  Take 30 mLs by mouth daily as needed for mild constipation or moderate constipation.     NUTRITIONAL SUPPLEMENT PO  Take 60 mLs by mouth 3 (three) times daily.     Propylene Glycol 0.6 % Soln  Place 1 drop into both eyes 3 (three) times daily.        No orders of the defined types were placed in this encounter.    Immunization History  Administered Date(s) Administered  . Influenza-Unspecified  06/16/2014, 07/23/2015  . PPD Test 11/03/2014, 11/17/2014  . Pneumococcal-Unspecified 06/16/2014    Social History  Substance Use Topics  . Smoking status: Never Smoker   . Smokeless tobacco: Not on file  . Alcohol Use: No    Review of Systems  DATA OBTAINED: from patient, nurse, medical record, family member GENERAL:  no fevers, fatigue, appetite changes SKIN: No itching, rash HEENT: No complaint RESPIRATORY: No cough, wheezing, SOB CARDIAC: No chest pain, palpitations, lower extremity edema  GI: No abdominal pain, No N/V/D or constipation, No heartburn or reflux  GU: No dysuria, frequency or urgency, or incontinence  MUSCULOSKELETAL: No unrelieved bone/joint pain NEUROLOGIC: No headache, dizziness  PSYCHIATRIC: No overt anxiety or sadness  There were no vitals filed for this visit.  Physical Exam  GENERAL APPEARANCE: Alert, conversant, No acute distress  SKIN: No diaphoresis rash, or wounds HEENT: Unremarkable RESPIRATORY: Breathing is even, unlabored. Lung sounds are clear   CARDIOVASCULAR: Heart RRR no murmurs, rubs or gallops. No peripheral edema  GASTROINTESTINAL: Abdomen is soft, non-tender, not distended w/ normal bowel sounds.  GENITOURINARY: Bladder non tender, not distended  MUSCULOSKELETAL: No abnormal joints or musculature NEUROLOGIC:  Cranial nerves 2-12 grossly intact. Moves all extremities PSYCHIATRIC: Mood and affect appropriate to situation, no behavioral issues  Patient Active Problem List   Diagnosis Date Noted  . Cellulitis 12/06/2015  . Rash and nonspecific skin eruption 11/13/2015  . Hyperlipidemia 07/29/2015  . Vascular dementia without behavioral disturbance 05/11/2015  . Depression due to dementia 05/11/2015  . Allergic rhinitis 11/26/2014  . TIA (transient ischemic attack) 10/31/2014  . Hypothyroidism 10/31/2014  . CKD (chronic kidney disease) stage 3, GFR 30-59 ml/min 10/31/2014  . Essential hypertension, benign 07/04/2010    CBC     Component Value Date/Time   WBC 9.6 11/01/2014 0405   WBC 12.3* 05/12/2014 1334   RBC 4.71 11/01/2014 0405   RBC 4.92 05/12/2014 1334   HGB 14.8 11/01/2014 0405   HGB 14.8 05/12/2014 1334   HCT 43.0 11/01/2014 0405   HCT 46.1 05/12/2014 1334   PLT 284 11/01/2014 0405   MCV 91.3 11/01/2014 0405   MCV 93.8 05/12/2014 1334   LYMPHSABS 1.1 10/31/2014 1751   MONOABS 0.9 10/31/2014 1751   EOSABS 0.3 10/31/2014 1751   BASOSABS 0.0 10/31/2014 1751    CMP     Component Value Date/Time   NA 138 11/01/2014 0405   K 4.1 11/01/2014 0405   CL 104 11/01/2014 0405   CO2 26 11/01/2014 0405   GLUCOSE 103* 11/01/2014 0405   BUN 21 11/01/2014 0405   CREATININE 1.79* 11/01/2014 0405   CALCIUM 9.0 11/01/2014 0405   PROT 6.8 10/31/2014 1751   ALBUMIN 3.9 10/31/2014 1751   AST 22 10/31/2014 1751   ALT 10 10/31/2014 1751   ALKPHOS 71 10/31/2014 1751   BILITOT 0.6 10/31/2014 1751   GFRNONAA 25* 11/01/2014 0405   GFRAA 28* 11/01/2014 0405    Assessment and Plan  No problem-specific assessment & plan notes found for this encounter.   Margit Hanks, MD

## 2015-12-13 NOTE — Progress Notes (Signed)
Patient ID: Kristie Gonzales, female   DOB: 03/22/1926, 82 y.o.   MRN: [id number] MRN: [id number] Name: Kristie Gonzales  Sex: female Age: 82 y.o. DOB: 11/30/1925  [facility] Facility[facility]: Level Of Care: SNF Provider: Merrilee SeashoreAnne [place] Segler Emergency Contacts: Extended Emergency Contact Information Primary Emergency Contact: Coralyn PearHarris,June  [place] Home Phone: [phone] Relation: Daughter  Code Status:   Allergies: Sulfonamide derivatives  Chief Complaint  Patient presents with  . Acute Visit    HPI: Patient is 82 y.o. female who is being seen acutely per nursing request because she has new blisters and more blisters , esp noted on her hands. Again, no fever or systemic sx. Does not appear to itch. Nothing makes it better or worse. Ongoing for probably a month.  Past Medical History  Diagnosis Date  . Thyroid disease   . Hypertension   . Pneumonia 04/19/12  . Hypothyroidism   . Dementia     Past Surgical History  Procedure Laterality Date  . Abdominal hysterectomy    . Appendectomy        Medication List       This list is accurate as of: 12/13/15 11:59 PM.  Always use your most recent med list.               aspirin 325 MG tablet  Take 1 tablet (325 mg total) by mouth daily.     atorvastatin 40 MG tablet  Commonly known as:  LIPITOR  Take 1 tablet (40 mg total) by mouth daily at 6 PM.     doxycycline 100 MG EC tablet  Commonly known as:  DORYX  Take 100 mg by mouth 2 (two) times daily.     escitalopram 5 MG tablet  Commonly known as:  LEXAPRO  Take 1 tablet (5 mg total) by mouth daily.     levothyroxine 112 MCG tablet  Commonly known as:  SYNTHROID, LEVOTHROID  Take 112 mcg by mouth daily before breakfast.     loratadine 10 MG tablet  Commonly known as:  CLARITIN  Take 10 mg by mouth daily.     memantine 28 MG Cp24 24 hr capsule  Commonly known as:  NAMENDA XR  Take 1 capsule (28 mg total) by mouth daily.     MILK OF MAGNESIA  400 MG/5ML suspension  Generic drug:  magnesium hydroxide  Take 30 mLs by mouth daily as needed for mild constipation or moderate constipation.     NUTRITIONAL SUPPLEMENT PO  Take 60 mLs by mouth 3 (three) times daily.     Propylene Glycol 0.6 % Soln  Place 1 drop into both eyes 3 (three) times daily. Reported on 12/26/2015        Meds ordered this encounter  Medications  . doxycycline (DORYX) 100 MG EC tablet    Sig: Take 100 mg by mouth 2 (two) times daily.  Marland Kitchen. levothyroxine (SYNTHROID, LEVOTHROID) 112 MCG tablet    Sig: Take 112 mcg by mouth daily before breakfast.    Immunization History  Administered Date(s) Administered  . Influenza-Unspecified 06/16/2014, 07/23/2015  . PPD Test 11/03/2014, 11/17/2014  . Pneumococcal-Unspecified 06/16/2014    Social History  Substance Use Topics  . Smoking status: Never Smoker   . Smokeless tobacco: Not on file  . Alcohol Use: No    Review of Systems  UTO 2/2 dementia; nursing as per HPI    Filed Vitals:   12/13/15 1459  BP: 134/62  Pulse: 72  Temp: 97.6  F (36.4 C)  Resp: 18    Physical Exam  GENERAL APPEARANCE: Alert, non conversant, No acute distress  SKIN: new blisters on dorsal hands, a few on palms, not on red base; cellulitia RUE and RLE is improved and blisters are resolving  HEENT: Unremarkable RESPIRATORY: Breathing is even, unlabored. Lung sounds are clear   CARDIOVASCULAR: Heart RRR no murmurs, rubs or gallops. No peripheral edema  GASTROINTESTINAL: Abdomen is soft, non-tender, not distended w/ normal bowel sounds.  GENITOURINARY: Bladder non tender, not distended  MUSCULOSKELETAL: No abnormal joints or musculature NEUROLOGIC: Cranial nerves 2-12 grossly intact. Moves all extremities PSYCHIATRIC: dementia, no behavioral issues  Patient Active Problem List   Diagnosis Date Noted  . Hospice care 01/01/2016  . HCAP (healthcare-associated pneumonia) 12/29/2015  . Dehydration   . Hypernatremia 12/27/2015   . Cellulitis 12/06/2015  . Rash and nonspecific skin eruption 11/13/2015  . Hyperlipidemia 07/29/2015  . Vascular dementia without behavioral disturbance 05/11/2015  . Depression due to dementia 05/11/2015  . Allergic rhinitis 11/26/2014  . TIA (transient ischemic attack) 10/31/2014  . Hypothyroidism 10/31/2014  . CKD (chronic kidney disease) stage 4, GFR 15-29 ml/min (HCC) 10/31/2014    CBC    Component Value Date/Time   WBC 11.0* 12/28/2015 0828   WBC 10.7 12/01/2015   WBC 12.3* 05/12/2014 1334   RBC 3.95 12/28/2015 0828   RBC 4.92 05/12/2014 1334   HGB 12.2 12/28/2015 0828   HGB 14.8 05/12/2014 1334   HCT 37.9 12/28/2015 0828   HCT 46.1 05/12/2014 1334   PLT 254 12/28/2015 0828   MCV 95.9 12/28/2015 0828   MCV 93.8 05/12/2014 1334   LYMPHSABS 2.1 12/27/2015 1604   MONOABS 1.1* 12/27/2015 1604   EOSABS 3.3* 12/27/2015 1604   BASOSABS 0.1 12/27/2015 1604    CMP     Component Value Date/Time   NA 151* 12/28/2015 0804   NA 153* 12/24/2015   K 3.6 12/28/2015 0804   CL 119* 12/28/2015 0804   CO2 23 12/28/2015 0804   GLUCOSE 121* 12/28/2015 0804   BUN 29* 12/28/2015 0804   BUN 43* 12/24/2015   CREATININE 1.70* 12/28/2015 0804   CREATININE 1.8* 12/24/2015   CALCIUM 8.0* 12/28/2015 0804   PROT 6.4* 12/27/2015 1604   ALBUMIN 2.9* 12/27/2015 1604   AST 21 12/27/2015 1604   ALT 16 12/27/2015 1604   ALKPHOS 117 12/27/2015 1604   BILITOT 0.5 12/27/2015 1604   GFRNONAA 26* 12/28/2015 0804   GFRAA 30* 12/28/2015 0804    Assessment and Plan  Rash and nonspecific skin eruption Rash is ongoing, cellulitis is improved with doxycycline. The order i wrote last visit for wound care has not bEEN ADDRESSED AT all. Wound care order written AGAIN and put into the hands of the DON. At wound care a biopsy can be done and get a dx that way. Trying to get to derm is impossible.   Time spent > 25 min;> 50% of time with patient was spent reviewing records, labs, tests and studies,  counseling and developing plan of care  Merrilee Seashore, MD

## 2015-12-20 NOTE — Progress Notes (Signed)
Patient ID: Kristie MannsLillie K Dobek, female   DOB: 04/09/1926, 82 y.o.   MRN: [id number]    Facility:  [facility]       Allergies  Allergen Reactions  . Sulfonamide Derivatives Other (See Comments)    Unknown allergic reaction    Chief Complaint  Patient presents with  . Medical Management of Chronic Issues    HPI:  She is a long term resident of this facility being seen for the management of her chronic illnesses. Overall there is little change in her status. She is unable to fully participate in the hpi or ros. There are no nursing concerns at this time.    Past Medical History  Diagnosis Date  . Thyroid disease   . Hypertension   . Pneumonia 04/19/12  . Hypothyroidism   . Dementia     Past Surgical History  Procedure Laterality Date  . Abdominal hysterectomy    . Appendectomy      VITAL SIGNS BP 124/63 mmHg  Pulse 72  Ht 5\' 2"  (1.575 m)  Wt 172 lb (78.019 kg)  BMI 31.45 kg/m2  SpO2 99%  Patient's Medications  New Prescriptions   No medications on file  Previous Medications   ASPIRIN 325 MG TABLET    Take 1 tablet (325 mg total) by mouth daily.   ATORVASTATIN (LIPITOR) 40 MG TABLET    Take 1 tablet (40 mg total) by mouth daily at 6 PM.   ESCITALOPRAM (LEXAPRO) 5 MG TABLET    Take 1 tablet (5 mg total) by mouth daily.   LEVOTHYROXINE (SYNTHROID, LEVOTHROID) 112 MCG TABLET    Take 112 mcg by mouth daily before breakfast.   LORATADINE (CLARITIN) 10 MG TABLET    Take 10 mg by mouth daily.   MAGNESIUM HYDROXIDE (MILK OF MAGNESIA) 400 MG/5ML SUSPENSION    Take 30 mLs by mouth daily as needed for mild constipation or moderate constipation.   MEMANTINE (NAMENDA XR) 28 MG CP24 24 HR CAPSULE    Take 1 capsule (28 mg total) by mouth daily.   NUTRITIONAL SUPPLEMENTS (NUTRITIONAL SUPPLEMENT PO)    Take 60 mLs by mouth 3 (three) times daily.   PROPYLENE GLYCOL 0.6 % SOLN    Place 1 drop into both eyes 3 (three) times daily.   Modified Medications   No medications on file    Discontinued Medications   No medications on file     SIGNIFICANT DIAGNOSTIC EXAMS   10-31-14: ct of head and cervical spine: 1. No acute intracranial pathology. 2. No acute osseous injury of the cervical spine.  10-31-14: right elbow x-ray: Small spur arising from the medial distal humeral condyle. No fracture or dislocation. No appreciable joint effusion.  11-01-14; mri/mra of head: MRI HEAD: No acute intracranial process, specifically no evidence of acute ischemia. Involutional changes. Moderate white matter changes can be seen with chronic small vessel ischemic disease. Tiny stable T2 hyperintensities in the basal ganglia suggest perivascular spaces and/or lacunar infarcts. MRA HEAD: No high-grade stenosis or large vessel occlusion. Mild luminal irregularity of the anterior and posterior circulation most consistent with atherosclerosis.  11-02-14: 2-d echo: Left ventricle: The cavity size was normal. Wall thickness was normal. Systolic function was vigorous. The estimated ejection fraction was in the range of 65% to 70%. Wall motion was normal. there were no regional wall motion abnormalities. Features are consistent with a pseudonormal left ventricular filling pattern, with concomitant abnormal relaxation and increased filling pressure (grade 2 diastolic dysfunction).  11-02-14: carotid doppler: Bilateral:  mild mixed plaque CCA and origin ICA. 1-39% ICA stenosis. Vertebral artery flow is antegrade.  03-05-15: kub: negative intestinal gas pattern   04-09-15: lumbar spine x-ray; no acute fracture; degenerative scoliosis lateral to the left     LABS REVIEWED:   10-31-14: wbc 11.7; hgb 14.9; hct 43.1; mcv 90.7 plt2 76; glucose 138; bun 27; creat 2.28; k+3.4; na++142; liver normal albumin 3.9; hgb a1c 5.9 11-01-14: wbc 9.6; hgb 14.8; hct 43.0; mcv 91.3; plt 284; glucose 103; bun 2;1 creat 1.79; k+4.1 ;na++138; ck 233; chol 207; ldl 136; trig 128; hdl 45; tsh 0.522 04-06-15: wbc 8.6; hgb 14.6; hct  46.0; mcv 90.9; plt 293; glucose 102; bun 32.2; creat 1.25; k+4.2; na++142; liver normal albumin 4.3 tsh 3.03 hgb a1c 6.0; chol 144; ldl 72; trig 101; hdl 52  16-1-09: wbc 8.8; hct 14.3; hct 46.8; mcv 94.4; plt 241; glucose 133; bun 27.4; mcv 1.54; k+ 4.8; na++142; liver normal albumin 4.0; tsh 7.83     Review of Systems Unable to perform ROS: Dementia    Physical Exam Constitutional: No distress.  Eyes: Conjunctivae are normal.  Neck: Neck supple. No JVD present. No thyromegaly present.  Cardiovascular: Normal rate, regular rhythm and intact distal pulses.   Respiratory: Effort normal and breath sounds normal. No respiratory distress. She has no wheezes.  GI: Soft. Bowel sounds are normal. She exhibits no distension. There is no tenderness.  Musculoskeletal: She exhibits no edema.  Able to move all extremities   Lymphadenopathy:    She has no cervical adenopathy.  Neurological: She is alert.  Skin: Skin is warm and dry. She is not diaphoretic.  Psychiatric: She has a normal mood and affect.     ASSESSMENT/ PLAN:  1. Dyslipidemia: will continue lipitor 40 mg daily her ldl is 72  2. Hypothyroidism: will increase her synthroid to 112 mcg daily; tsh is 7.83  3. Allergic rhinitis: will continue claritin 10 mg daily  4. TIA: is neurologically stable; will continue asa 325 mg daily   5. Vascular dementia: no significant change in her status; will continue namenda xr 28 mg daily will monitor  6. Depression: is stable will continue lexapro 5 mg daily   7. CKD stage III: is stable her creat is 1.54; will monitor     In 6 weeks will check lipids; tsh free t3 free t4    Synthia Innocent NP [facility]  Contact [phone] Monday through Friday 8am- 5pm  After hours call [phone]

## 2015-12-22 ENCOUNTER — Non-Acute Institutional Stay (SKILLED_NURSING_FACILITY): Payer: Medicare Other | Admitting: Adult Health

## 2015-12-22 DIAGNOSIS — F0150 Vascular dementia without behavioral disturbance: Secondary | ICD-10-CM | POA: Diagnosis not present

## 2015-12-22 DIAGNOSIS — Y92129 Unspecified place in nursing home as the place of occurrence of the external cause: Principal | ICD-10-CM

## 2015-12-22 DIAGNOSIS — W19XXXA Unspecified fall, initial encounter: Secondary | ICD-10-CM

## 2015-12-23 ENCOUNTER — Ambulatory Visit (HOSPITAL_BASED_OUTPATIENT_CLINIC_OR_DEPARTMENT_OTHER)
Admission: RE | Admit: 2015-12-23 | Discharge: 2015-12-23 | Disposition: A | Discharge status: Home / Self Care | Payer: Medicare Other | Source: Ambulatory Visit | Attending: Internal Medicine | Admitting: Internal Medicine

## 2015-12-23 ENCOUNTER — Encounter: Payer: Self-pay | Admitting: Adult Health

## 2015-12-23 ENCOUNTER — Encounter: Payer: Self-pay | Admitting: Internal Medicine

## 2015-12-23 ENCOUNTER — Other Ambulatory Visit: Payer: Self-pay | Admitting: Internal Medicine

## 2015-12-23 DIAGNOSIS — W19XXXA Unspecified fall, initial encounter: Secondary | ICD-10-CM

## 2015-12-23 DIAGNOSIS — S0003XA Contusion of scalp, initial encounter: Secondary | ICD-10-CM | POA: Insufficient documentation

## 2015-12-23 DIAGNOSIS — R4182 Altered mental status, unspecified: Secondary | ICD-10-CM | POA: Insufficient documentation

## 2015-12-23 DIAGNOSIS — G319 Degenerative disease of nervous system, unspecified: Secondary | ICD-10-CM | POA: Diagnosis not present

## 2015-12-23 DIAGNOSIS — S0083XA Contusion of other part of head, initial encounter: Secondary | ICD-10-CM

## 2015-12-23 NOTE — Progress Notes (Signed)
Patient ID: Kristie Gonzales, female   DOB: 04/03/1926, 82 y.o.   MRN: [id number]   Facility:  [facility]       Allergies  Allergen Reactions  . Sulfonamide Derivatives Other (See Comments)    Unknown allergic reaction    Chief Complaint  Patient presents with  . Acute Visit    Falls    HPI:  She has had 2 falls she has bilateral black eyes. She is unable to participate in the hpi or ros. She is left aware of surroundings is less engaging. She does have a left nasal deformity the staff reports that this appears to be new.    Past Medical History  Diagnosis Date  . Thyroid disease   . Hypertension   . Pneumonia 04/19/12  . Hypothyroidism   . Dementia     Past Surgical History  Procedure Laterality Date  . Abdominal hysterectomy    . Appendectomy      VITAL SIGNS There were no vitals taken for this visit.  Patient's Medications  New Prescriptions   No medications on file  Previous Medications   ASPIRIN 325 MG TABLET    Take 1 tablet (325 mg total) by mouth daily.   ATORVASTATIN (LIPITOR) 40 MG TABLET    Take 1 tablet (40 mg total) by mouth daily at 6 PM.   ESCITALOPRAM (LEXAPRO) 5 MG TABLET    Take 1 tablet (5 mg total) by mouth daily.   LEVOTHYROXINE (SYNTHROID, LEVOTHROID) 112 MCG TABLET    Take 112 mcg by mouth daily before breakfast.   LORATADINE (CLARITIN) 10 MG TABLET    Take 10 mg by mouth daily.   MAGNESIUM HYDROXIDE (MILK OF MAGNESIA) 400 MG/5ML SUSPENSION    Take 30 mLs by mouth daily as needed for mild constipation or moderate constipation.   MEMANTINE (NAMENDA XR) 28 MG CP24 24 HR CAPSULE    Take 1 capsule (28 mg total) by mouth daily.   NUTRITIONAL SUPPLEMENTS (NUTRITIONAL SUPPLEMENT PO)    Take 60 mLs by mouth 3 (three) times daily.   PROPYLENE GLYCOL 0.6 % SOLN    Place 1 drop into both eyes 3 (three) times daily.   Modified Medications   No medications on file  Discontinued Medications   No medications on file     SIGNIFICANT DIAGNOSTIC  EXAMS  12-30-14: left knee x-ray: modest osteoarthritis  12-30-14: left hip with pelvis x-ray; modest osteoarthritis left hip   12-01-15: right hand and right wrist x-ray: mild to moderate osteoarthritis.     LABS REVIEWED:   02-05-15: hgb a1c 5.6 02-19-15:urine culture: e-coli: cipro 04-12-15: wbc 8.4; hgb 11.5; hct 38.3; mcv 92.7; plt 324; glucose 104; bun 31.7; creat 0.93; k+ 5.0; na++ 141; liver normal albumin 4.0; tsh 1.52  09-23-15: urine culture: e-coli: cipro  12-01-15: wbc 10.7; hgb 14.5; hct 44.2; mcv 92.1 plt 263; sed rate 18    Review of Systems  Unable to perform ROS: other       Physical Exam Constitutional: No distress.  Thin   Neck: Neck supple. No JVD present. No thyromegaly present.  Breast exam negative  Cardiovascular: Normal rate and intact distal pulses.   Heart rate irregular   Respiratory: lungs clear; no respiratory distress  GI: Soft. She exhibits no distension.  Musculoskeletal: She exhibits no edema.  Is able to move all extremities Has bilateral resting tremors present   Neurological: She is less aware  Skin: Skin is warm and dry. She is not diaphoretic.  Bilateral bruised eyes; left nasal deformity      ASSESSMENT/ PLAN:  1. Vascular dementia 2. Status post fall X2 Will get a cbc; cmp; chest x-ray; will get a ct of head and will monitor her status.      Synthia Innocent NP [facility]  Contact [phone] Monday through Friday 8am- 5pm  After hours call [phone]

## 2015-12-24 LAB — BASIC METABOLIC PANEL
BUN: 43 mg/dL — AB (ref 4–21)
CREATININE: 1.8 mg/dL — AB (ref 0.5–1.1)
Glucose: 69 mg/dL
Potassium: 4 mmol/L (ref 3.4–5.3)
SODIUM: 153 mmol/L — AB (ref 137–147)

## 2015-12-26 ENCOUNTER — Encounter (HOSPITAL_COMMUNITY): Payer: Self-pay

## 2015-12-26 ENCOUNTER — Emergency Department (HOSPITAL_COMMUNITY)
Admission: EM | Admit: 2015-12-26 | Discharge: 2015-12-26 | Disposition: A | Discharge status: Skilled Nursing Facility | Payer: Medicare Other | Source: Home / Self Care | Attending: Emergency Medicine | Admitting: Emergency Medicine

## 2015-12-26 ENCOUNTER — Emergency Department (HOSPITAL_COMMUNITY): Payer: Medicare Other

## 2015-12-26 DIAGNOSIS — S0101XA Laceration without foreign body of scalp, initial encounter: Secondary | ICD-10-CM

## 2015-12-26 DIAGNOSIS — W19XXXA Unspecified fall, initial encounter: Secondary | ICD-10-CM

## 2015-12-26 NOTE — ED Notes (Signed)
Bp of 74/63 validated in error.  BP cuff was loose and had slid down pt's arm.  Manual BP of 120/65.

## 2015-12-26 NOTE — ED Provider Notes (Signed)
CSN: [id number]     Arrival date & time 12/26/15  1805 History   First MD Initiated Contact with Patient 12/26/15 1826     Chief Complaint  Patient presents with  . Fall    FALL FROM [facility]     HPI Patient presents after falling and suffering a scalp laceration.  No documented LOC.  Patient is a poor historian and unable to give a history.  She has recently falling and has an old abrasion to her right arm.  We removed the dressing and examine that and appears to be no signs of infection.  She has no neck tenderness or long bone or extremity tenderness. Past Medical History  Diagnosis Date  . Thyroid disease   . Hypertension   . Pneumonia 04/19/12  . Hypothyroidism   . Dementia    Past Surgical History  Procedure Laterality Date  . Abdominal hysterectomy    . Appendectomy     Family History  Problem Relation Age of Onset  . Diabetes Son   . Heart disease Son    Social History  Substance Use Topics  . Smoking status: Never Smoker   . Smokeless tobacco: None  . Alcohol Use: No   OB History    No data available     Review of Systems  All other systems reviewed and are negative.     Allergies  Sulfonamide derivatives  Home Medications   Prior to Admission medications   Medication Sig Start Date End Date Taking? Authorizing Provider  acetaminophen (TYLENOL) 325 MG tablet Take 650 mg by mouth every 4 (four) hours as needed (for pain).   Yes Historical Provider, MD  aspirin 325 MG tablet Take 1 tablet (325 mg total) by mouth daily. 11/03/14  Yes Costin Otelia Sergeant, MD  atorvastatin (LIPITOR) 40 MG tablet Take 1 tablet (40 mg total) by mouth daily at 6 PM. 11/03/14  Yes Costin Otelia Sergeant, MD  escitalopram (LEXAPRO) 5 MG tablet Take 1 tablet (5 mg total) by mouth daily. 05/11/15  Yes Sharee Holster, NP  levofloxacin (LEVAQUIN) 750 MG tablet Take 750 mg by mouth daily. Started 03/22 for 10 days   Yes Historical Provider, MD  levothyroxine (SYNTHROID, LEVOTHROID) 112 MCG  tablet Take 112 mcg by mouth daily before breakfast.   Yes Historical Provider, MD  loratadine (CLARITIN) 10 MG tablet Take 10 mg by mouth daily.   Yes Historical Provider, MD  magnesium hydroxide (MILK OF MAGNESIA) 400 MG/5ML suspension Take 30 mLs by mouth daily as needed for mild constipation or moderate constipation.   Yes Historical Provider, MD  memantine (NAMENDA XR) 28 MG CP24 24 hr capsule Take 1 capsule (28 mg total) by mouth daily. 05/11/15  Yes Sharee Holster, NP  Nutritional Supplements (NUTRITIONAL SUPPLEMENT PO) Take 60 mLs by mouth 3 (three) times daily.   Yes Historical Provider, MD  Propylene Glycol (SYSTANE BALANCE) 0.6 % SOLN Place 1 drop into both eyes 3 (three) times daily.   Yes Historical Provider, MD  saccharomyces boulardii (FLORASTOR) 250 MG capsule Take 250 mg by mouth 2 (two) times daily. Started 03/23 for 3 weeks   Yes Historical Provider, MD  triamcinolone cream (KENALOG) 0.1 % Apply 1 application topically 2 (two) times daily as needed (for rash).   Yes Historical Provider, MD   BP 137/69 mmHg  Pulse 98  Temp(Src) 98.2 F (36.8 C) (Oral)  Resp 18  Ht  (1.626 m)  Wt 166 lb (75.297 kg)  BMI  28.48 kg/m2  SpO2 98% Physical Exam  Constitutional: She appears well-developed and well-nourished. No distress.  HENT:  Head: Normocephalic. Head is with raccoon's eyes and with laceration.    Eyes: Pupils are equal, round, and reactive to light.  Neck: Normal range of motion.  Cardiovascular: Normal rate and intact distal pulses.   Pulmonary/Chest: No respiratory distress.  Abdominal: Normal appearance. She exhibits no distension.  Neurological: She is alert. No cranial nerve deficit.  Skin: Skin is warm and dry. Abrasion noted. No rash noted.     Nursing note and vitals reviewed.   ED Course  .Marland Kitchen.Laceration Repair Date/Time: 12/26/2015 9:28 PM Performed by: Nelva NayBEATON, Jorey Dollard Authorized by: Nelva NayBEATON, Doralene Glanz Consent: Verbal consent not obtained. Written consent  not obtained. Body area: head/neck Location details: scalp Laceration length: 3 cm Skin closure: staples Number of sutures: 2 Technique: simple Approximation: loose Approximation difficulty: simple Dressing: antibiotic ointment Patient tolerance: Patient tolerated the procedure well with no immediate complications   (including critical care time)  Labs Review Labs Reviewed - No data to display  Imaging Review Ct Head Wo Contrast  12/26/2015  CLINICAL DATA:  FALL FROM HER WHEELCHAIR. today PT HAS A LACERATION TO THE TOP-RIGHT SIDE OF HER HEAD. PT HAS MULTIPLE OLD BRUISING ALL OVER FROM PREVIOUS FALLS. EXAM: CT HEAD WITHOUT CONTRAST TECHNIQUE: Contiguous axial images were obtained from the base of the skull through the vertex without intravenous contrast. COMPARISON:  12/23/2015 FINDINGS: The ventricles are normal configuration. There is ventricular and sulcal enlargement reflecting moderate fused atrophy. There is no hydrocephalus. There are no parenchymal masses or mass effect. There is no evidence of a cortical infarct. White matter hypoattenuation is noted consistent with mild chronic microvascular ischemic change. There are no extra-axial masses or abnormal fluid collections. There is no intracranial hemorrhage. There are are right larger than left frontal scalp hematomas. No skull fracture. Small amount of dependent fluid left sphenoid sinus. Remaining visualized sinuses and mastoid air cells are clear. IMPRESSION: 1. No acute intracranial abnormalities.  No intracranial hemorrhage. 2. Bilateral frontal scalp hematomas, larger on the left. No skull fracture. 3. Moderate atrophy and mild chronic microvascular ischemic change. Electronically Signed   By: Amie Portlandavid  Ormond M.D.   On: 12/26/2015 19:24   I have personally reviewed and evaluated these images and lab results as part of my medical decision-making.    MDM   Final diagnoses:  Fall, initial encounter  Scalp laceration, initial  encounter        Nelva Nayobert Maevyn Riordan, MD 12/26/15 2129

## 2015-12-26 NOTE — ED Notes (Signed)
PT RECEIVED VIA EMS FOR A FALL FROM HER WHEELCHAIR. PT HAS A LACERATION TO THE TOP-RIGHT SIDE OF HER HEAD. PT HAS MULTIPLE OLD BRUISING ALL OVER FROM PREVIOUS FALLS.

## 2015-12-26 NOTE — ED Notes (Signed)
Bed: [id number] Expected date:  Expected time:  Means of arrival:  Comments: EMS- fall from wheelchair- head abrasion

## 2015-12-26 NOTE — Discharge Instructions (Signed)
Fall Prevention in Hospitals, Adult As a hospital patient, your condition and the treatments you receive can increase your risk for falls. Some additional risk factors for falls in a hospital include:  Being in an unfamiliar environment.  Being on bed rest.  Your surgery.  Taking certain medicines.  Your tubing requirements, such as intravenous (IV) therapy or catheters. It is important that you learn how to decrease fall risks while at the hospital. Below are important tips that can help prevent falls. SAFETY TIPS FOR PREVENTING FALLS Talk about your risk of falling.  Ask your health care provider why you are at risk for falling. Is it your medicine, illness, tubing placement, or something else?  Make a plan with your health care provider to keep you safe from falls.  Ask your health care provider or pharmacist about side effects of your medicines. Some medicines can make you dizzy or affect your coordination. Ask for help.  Ask for help before getting out of bed. You may need to press your call button.  Ask for assistance in getting safely to the toilet.  Ask for a walker or cane to be put at your bedside. Ask that most of the side rails on your bed be placed up before your health care provider leaves the room.  Ask family or friends to sit with you.  Ask for things that are out of your reach, such as your glasses, hearing aids, telephone, bedside table, or call button. Follow these tips to avoid falling:  Stay lying or seated, rather than standing, while waiting for help.  Wear rubber-soled slippers or shoes whenever you walk in the hospital.  Avoid quick, sudden movements.  Change positions slowly.  Sit on the side of your bed before standing.  Stand up slowly and wait before you start to walk.  Let your health care provider know if there is a spill on the floor.  Pay careful attention to the medical equipment, electrical cords, and tubes around you.  When you  need help, use your call button by your bed or in the bathroom. Wait for one of your health care providers to help you.  If you feel dizzy or unsure of your footing, return to bed and wait for assistance.  Avoid being distracted by the TV, telephone, or another person in your room.  Do not lean or support yourself on rolling objects, such as IV poles or bedside tables.   This information is not intended to replace advice given to you by your health care provider. Make sure you discuss any questions you have with your health care provider.   Document Released: 09/15/2000 Document Revised: 10/09/2014 Document Reviewed: 05/26/2012 Elsevier Interactive Patient Education 2016 [employer] Inc[employer]  Laceration Care, Adult A laceration is a cut that goes through all of the layers of the skin and into the tissue that is right under the skin. Some lacerations heal on their own. Others need to be closed with stitches (sutures), staples, skin adhesive strips, or skin glue. Proper laceration care minimizes the risk of infection and helps the laceration to heal better. HOW TO CARE FOR YOUR LACERATION If sutures or staples were used:  Keep the wound clean and dry.  If you were given a bandage (dressing), you should change it at least one time per day or as told by your health care provider. You should also change it if it becomes wet or dirty.  Keep the wound completely dry for the first 24 hours or  as told by your health care provider. After that time, you may shower or bathe. However, make sure that the wound is not soaked in water until after the sutures or staples have been removed.  Clean the wound one time each day or as told by your health care provider:  Wash the wound with soap and water.  Rinse the wound with water to remove all soap.  Pat the wound dry with a clean towel. Do not rub the wound.  After cleaning the wound, apply a thin layer of antibiotic ointmentas told by your health care  provider. This will help to prevent infection and keep the dressing from sticking to the wound.  Have the sutures or staples removed as told by your health care provider. If skin adhesive strips were used:  Keep the wound clean and dry.  If you were given a bandage (dressing), you should change it at least one time per day or as told by your health care provider. You should also change it if it becomes dirty or wet.  Do not get the skin adhesive strips wet. You may shower or bathe, but be careful to keep the wound dry.  If the wound gets wet, pat it dry with a clean towel. Do not rub the wound.  Skin adhesive strips fall off on their own. You may trim the strips as the wound heals. Do not remove skin adhesive strips that are still stuck to the wound. They will fall off in time. If skin glue was used:  Try to keep the wound dry, but you may briefly wet it in the shower or bath. Do not soak the wound in water, such as by swimming.  After you have showered or bathed, gently pat the wound dry with a clean towel. Do not rub the wound.  Do not do any activities that will make you sweat heavily until the skin glue has fallen off on its own.  Do not apply liquid, cream, or ointment medicine to the wound while the skin glue is in place. Using those may loosen the film before the wound has healed.  If you were given a bandage (dressing), you should change it at least one time per day or as told by your health care provider. You should also change it if it becomes dirty or wet.  If a dressing is placed over the wound, be careful not to apply tape directly over the skin glue. Doing that may cause the glue to be pulled off before the wound has healed.  Do not pick at the glue. The skin glue usually remains in place for 5-10 days, then it falls off of the skin. General Instructions  Take over-the-counter and prescription medicines only as told by your health care provider.  If you were prescribed  an antibiotic medicine or ointment, take or apply it as told by your doctor. Do not stop using it even if your condition improves.  To help prevent scarring, make sure to cover your wound with sunscreen whenever you are outside after stitches are removed, after adhesive strips are removed, or when glue remains in place and the wound is healed. Make sure to wear a sunscreen of at least 30 SPF.  Do not scratch or pick at the wound.  Keep all follow-up visits as told by your health care provider. This is important.  Check your wound every day for signs of infection. Watch for:  Redness, swelling, or pain.  Fluid, blood, or  pus.  Raise (elevate) the injured area above the level of your heart while you are sitting or lying down, if possible. SEEK MEDICAL CARE IF:  You received a tetanus shot and you have swelling, severe pain, redness, or bleeding at the injection site.  You have a fever.  A wound that was closed breaks open.  You notice a bad smell coming from your wound or your dressing.  You notice something coming out of the wound, such as wood or glass.  Your pain is not controlled with medicine.  You have increased redness, swelling, or pain at the site of your wound.  You have fluid, blood, or pus coming from your wound.  You notice a change in the color of your skin near your wound.  You need to change the dressing frequently due to fluid, blood, or pus draining from the wound.  You develop a new rash.  You develop numbness around the wound. SEEK IMMEDIATE MEDICAL CARE IF:  You develop severe swelling around the wound.  Your pain suddenly increases and is severe.  You develop painful lumps near the wound or on skin that is anywhere on your body.  You have a red streak going away from your wound.  The wound is on your hand or foot and you cannot properly move a finger or toe.  The wound is on your hand or foot and you notice that your fingers or toes look pale or  bluish.   This information is not intended to replace advice given to you by your health care provider. Make sure you discuss any questions you have with your health care provider.   Document Released: 09/18/2005 Document Revised: 02/02/2015 Document Reviewed: 09/14/2014 Elsevier Interactive Patient Education 2016 [employer] Inc.  Head Injury, Adult You have a head injury. Headaches and throwing up (vomiting) are common after a head injury. It should be easy to wake up from sleeping. Sometimes you must stay in the hospital. Most problems happen within the first 24 hours. Side effects may occur up to 7-10 days after the injury.  WHAT ARE THE TYPES OF HEAD INJURIES? Head injuries can be as minor as a bump. Some head injuries can be more severe. More severe head injuries include:  A jarring injury to the brain (concussion).  A bruise of the brain (contusion). This mean there is bleeding in the brain that can cause swelling.  A cracked skull (skull fracture).  Bleeding in the brain that collects, clots, and forms a bump (hematoma). WHEN SHOULD I GET HELP RIGHT AWAY?   You are confused or sleepy.  You cannot be woken up.  You feel sick to your stomach (nauseous) or keep throwing up (vomiting).  Your dizziness or unsteadiness is getting worse.  You have very bad, lasting headaches that are not helped by medicine. Take medicines only as told by your doctor.  You cannot use your arms or legs like normal.  You cannot walk.  You notice changes in the black spots in the center of the colored part of your eye (pupil).  You have clear or bloody fluid coming from your nose or ears.  You have trouble seeing. During the next 24 hours after the injury, you must stay with someone who can watch you. This person should get help right away (call 911 in the [place] if you start to shake and are not able to control it (have seizures), you pass out, or you are unable to wake up. HOW CAN I  PREVENT A  HEAD INJURY IN THE FUTURE?  Wear seat belts.  Wear a helmet while bike riding and playing sports like football.  Stay away from dangerous activities around the house. WHEN CAN I RETURN TO NORMAL ACTIVITIES AND ATHLETICS? See your doctor before doing these activities. You should not do normal activities or play contact sports until 1 week after the following symptoms have stopped:  Headache that does not go away.  Dizziness.  Poor attention.  Confusion.  Memory problems.  Sickness to your stomach or throwing up.  Tiredness.  Fussiness.  Bothered by bright lights or loud noises.  Anxiousness or depression.  Restless sleep. MAKE SURE YOU:   Understand these instructions.  Will watch your condition.  Will get help right away if you are not doing well or get worse.   This information is not intended to replace advice given to you by your health care provider. Make sure you discuss any questions you have with your health care provider.   Document Released: 08/31/2008 Document Revised: 10/09/2014 Document Reviewed: 05/26/2013 Elsevier Interactive Patient Education [employer]

## 2015-12-27 ENCOUNTER — Emergency Department (HOSPITAL_COMMUNITY): Payer: Medicare Other

## 2015-12-27 ENCOUNTER — Encounter (HOSPITAL_COMMUNITY): Payer: Self-pay | Admitting: Emergency Medicine

## 2015-12-27 ENCOUNTER — Inpatient Hospital Stay (HOSPITAL_COMMUNITY)
Admission: EM | Admit: 2015-12-27 | Discharge: 2015-12-28 | DRG: 641 | Disposition: A | Discharge status: Skilled Nursing Facility | Payer: Medicare Other | Attending: Internal Medicine | Admitting: Internal Medicine

## 2015-12-27 ENCOUNTER — Non-Acute Institutional Stay (SKILLED_NURSING_FACILITY): Payer: Medicare Other | Admitting: Internal Medicine

## 2015-12-27 DIAGNOSIS — I10 Essential (primary) hypertension: Secondary | ICD-10-CM | POA: Diagnosis present

## 2015-12-27 DIAGNOSIS — E870 Hyperosmolality and hypernatremia: Principal | ICD-10-CM

## 2015-12-27 DIAGNOSIS — F0280 Dementia in other diseases classified elsewhere without behavioral disturbance: Secondary | ICD-10-CM | POA: Diagnosis present

## 2015-12-27 DIAGNOSIS — Z882 Allergy status to sulfonamides status: Secondary | ICD-10-CM | POA: Diagnosis not present

## 2015-12-27 DIAGNOSIS — F0150 Vascular dementia without behavioral disturbance: Secondary | ICD-10-CM | POA: Diagnosis present

## 2015-12-27 DIAGNOSIS — E860 Dehydration: Secondary | ICD-10-CM

## 2015-12-27 DIAGNOSIS — Z9181 History of falling: Secondary | ICD-10-CM

## 2015-12-27 DIAGNOSIS — N183 Chronic kidney disease, stage 3 (moderate): Secondary | ICD-10-CM

## 2015-12-27 DIAGNOSIS — X58XXXA Exposure to other specified factors, initial encounter: Secondary | ICD-10-CM | POA: Diagnosis present

## 2015-12-27 DIAGNOSIS — N184 Chronic kidney disease, stage 4 (severe): Secondary | ICD-10-CM | POA: Diagnosis present

## 2015-12-27 DIAGNOSIS — Z79899 Other long term (current) drug therapy: Secondary | ICD-10-CM | POA: Diagnosis not present

## 2015-12-27 DIAGNOSIS — E039 Hypothyroidism, unspecified: Secondary | ICD-10-CM | POA: Diagnosis present

## 2015-12-27 DIAGNOSIS — Z66 Do not resuscitate: Secondary | ICD-10-CM | POA: Diagnosis present

## 2015-12-27 DIAGNOSIS — I129 Hypertensive chronic kidney disease with stage 1 through stage 4 chronic kidney disease, or unspecified chronic kidney disease: Secondary | ICD-10-CM | POA: Diagnosis present

## 2015-12-27 DIAGNOSIS — D72829 Elevated white blood cell count, unspecified: Secondary | ICD-10-CM | POA: Diagnosis present

## 2015-12-27 DIAGNOSIS — Z7982 Long term (current) use of aspirin: Secondary | ICD-10-CM

## 2015-12-27 DIAGNOSIS — R21 Rash and other nonspecific skin eruption: Secondary | ICD-10-CM | POA: Diagnosis not present

## 2015-12-27 DIAGNOSIS — Z833 Family history of diabetes mellitus: Secondary | ICD-10-CM

## 2015-12-27 DIAGNOSIS — G309 Alzheimer's disease, unspecified: Secondary | ICD-10-CM | POA: Diagnosis present

## 2015-12-27 DIAGNOSIS — I4891 Unspecified atrial fibrillation: Secondary | ICD-10-CM

## 2015-12-27 DIAGNOSIS — S0003XA Contusion of scalp, initial encounter: Secondary | ICD-10-CM | POA: Diagnosis present

## 2015-12-27 LAB — COMPREHENSIVE METABOLIC PANEL
ALT: 16 U/L (ref 14–54)
ANION GAP: 8 (ref 5–15)
AST: 21 U/L (ref 15–41)
Albumin: 2.9 g/dL — ABNORMAL LOW (ref 3.5–5.0)
Alkaline Phosphatase: 117 U/L (ref 38–126)
BILIRUBIN TOTAL: 0.5 mg/dL (ref 0.3–1.2)
BUN: 39 mg/dL — ABNORMAL HIGH (ref 6–20)
CHLORIDE: 120 mmol/L — AB (ref 101–111)
CO2: 24 mmol/L (ref 22–32)
Calcium: 8.5 mg/dL — ABNORMAL LOW (ref 8.9–10.3)
Creatinine, Ser: 1.97 mg/dL — ABNORMAL HIGH (ref 0.44–1.00)
GFR calc Af Amer: 25 mL/min — ABNORMAL LOW (ref >60)
GFR, EST NON AFRICAN AMERICAN: 22 mL/min — AB (ref >60)
Glucose, Bld: 105 mg/dL — ABNORMAL HIGH (ref 65–99)
POTASSIUM: 4.1 mmol/L (ref 3.5–5.1)
Sodium: 152 mmol/L — ABNORMAL HIGH (ref 135–145)
TOTAL PROTEIN: 6.4 g/dL — AB (ref 6.5–8.1)

## 2015-12-27 LAB — CBC WITH DIFFERENTIAL/PLATELET
BASOS ABS: 0.1 10*3/uL (ref 0.0–0.1)
BASOS PCT: 0 %
EOS PCT: 23 %
Eosinophils Absolute: 3.3 10*3/uL — ABNORMAL HIGH (ref 0.0–0.7)
HEMATOCRIT: 43.3 % (ref 36.0–46.0)
Hemoglobin: 13.9 g/dL (ref 12.0–15.0)
Lymphocytes Relative: 14 %
Lymphs Abs: 2.1 10*3/uL (ref 0.7–4.0)
MCH: 30.8 pg (ref 26.0–34.0)
MCHC: 32.1 g/dL (ref 30.0–36.0)
MCV: 95.8 fL (ref 78.0–100.0)
MONO ABS: 1.1 10*3/uL — AB (ref 0.1–1.0)
MONOS PCT: 7 %
NEUTROS ABS: 8 10*3/uL — AB (ref 1.7–7.7)
Neutrophils Relative %: 56 %
PLATELETS: 252 10*3/uL (ref 150–400)
RBC: 4.52 MIL/uL (ref 3.87–5.11)
RDW: 14.6 % (ref 11.5–15.5)
WBC: 14.5 10*3/uL — ABNORMAL HIGH (ref 4.0–10.5)

## 2015-12-27 LAB — URINALYSIS, ROUTINE W REFLEX MICROSCOPIC
BILIRUBIN URINE: NEGATIVE
GLUCOSE, UA: NEGATIVE mg/dL
HGB URINE DIPSTICK: NEGATIVE
Ketones, ur: NEGATIVE mg/dL
LEUKOCYTES UA: NEGATIVE
NITRITE: NEGATIVE
PH: 5 (ref 5.0–8.0)
Protein, ur: NEGATIVE mg/dL
SPECIFIC GRAVITY, URINE: 1.024 (ref 1.005–1.030)

## 2015-12-27 LAB — MAGNESIUM: MAGNESIUM: 2.3 mg/dL (ref 1.7–2.4)

## 2015-12-27 LAB — PHOSPHORUS: PHOSPHORUS: 2.6 mg/dL (ref 2.5–4.6)

## 2015-12-27 MED ORDER — DEXTROSE 5 % IV SOLN
INTRAVENOUS | Status: DC
  Administered 2015-12-27 – 2015-12-28 (×3): via INTRAVENOUS

## 2015-12-27 MED ORDER — SODIUM CHLORIDE 0.9 % IV BOLUS (SEPSIS)
1000.0000 mL | Freq: Once | INTRAVENOUS | Status: AC
Start: 2015-12-27 — End: 2015-12-27
  Administered 2015-12-27: 1000 mL via INTRAVENOUS

## 2015-12-27 MED ORDER — ACETAMINOPHEN 325 MG PO TABS: 650.0000 mg | ORAL_TABLET | Freq: Four times a day (QID) | ORAL | Status: DC | PRN

## 2015-12-27 MED ORDER — SODIUM CHLORIDE 0.45 % IV SOLN: INTRAVENOUS | Status: DC

## 2015-12-27 MED ORDER — ONDANSETRON HCL 4 MG PO TABS: 4.0000 mg | ORAL_TABLET | Freq: Four times a day (QID) | ORAL | Status: DC | PRN

## 2015-12-27 MED ORDER — LORATADINE 10 MG PO TABS
10.0000 mg | ORAL_TABLET | Freq: Every day | ORAL | Status: DC
  Administered 2015-12-27 – 2015-12-28 (×2): 10 mg via ORAL
  Filled 2015-12-27 (×2): qty 1

## 2015-12-27 MED ORDER — POLYVINYL ALCOHOL 1.4 % OP SOLN
1.0000 [drp] | Freq: Three times a day (TID) | OPHTHALMIC | Status: DC
  Administered 2015-12-27 – 2015-12-28 (×3): 1 [drp] via OPHTHALMIC
  Filled 2015-12-27: qty 15

## 2015-12-27 MED ORDER — MAGNESIUM HYDROXIDE 400 MG/5ML PO SUSP: 30.0000 mL | Freq: Every day | ORAL | Status: DC | PRN

## 2015-12-27 MED ORDER — MEMANTINE HCL ER 28 MG PO CP24
28.0000 mg | ORAL_CAPSULE | Freq: Every day | ORAL | Status: DC
  Administered 2015-12-28: 28 mg via ORAL
  Filled 2015-12-27: qty 1

## 2015-12-27 MED ORDER — ACETAMINOPHEN 650 MG RE SUPP: 650.0000 mg | Freq: Four times a day (QID) | RECTAL | Status: DC | PRN

## 2015-12-27 MED ORDER — ONDANSETRON HCL 4 MG/2ML IJ SOLN: 4.0000 mg | Freq: Four times a day (QID) | INTRAMUSCULAR | Status: DC | PRN

## 2015-12-27 MED ORDER — LEVOFLOXACIN 750 MG PO TABS
750.0000 mg | ORAL_TABLET | Freq: Every day | ORAL | Status: DC
  Administered 2015-12-27 – 2015-12-28 (×2): 750 mg via ORAL
  Filled 2015-12-27 (×2): qty 1

## 2015-12-27 MED ORDER — ESCITALOPRAM OXALATE 5 MG PO TABS
5.0000 mg | ORAL_TABLET | Freq: Every day | ORAL | Status: DC
  Administered 2015-12-27 – 2015-12-28 (×2): 5 mg via ORAL
  Filled 2015-12-27 (×2): qty 1

## 2015-12-27 MED ORDER — ASPIRIN 325 MG PO TABS
325.0000 mg | ORAL_TABLET | Freq: Every day | ORAL | Status: DC
Start: 2015-12-27 — End: 2015-12-28
  Administered 2015-12-27 – 2015-12-28 (×2): 325 mg via ORAL
  Filled 2015-12-27 (×2): qty 1

## 2015-12-27 MED ORDER — SACCHAROMYCES BOULARDII 250 MG PO CAPS
250.0000 mg | ORAL_CAPSULE | Freq: Two times a day (BID) | ORAL | Status: DC
  Administered 2015-12-27 – 2015-12-28 (×2): 250 mg via ORAL
  Filled 2015-12-27 (×3): qty 1

## 2015-12-27 MED ORDER — LEVOTHYROXINE SODIUM 112 MCG PO TABS
112.0000 ug | ORAL_TABLET | Freq: Every day | ORAL | Status: DC
  Administered 2015-12-28: 112 ug via ORAL
  Filled 2015-12-27 (×3): qty 1

## 2015-12-27 MED ORDER — HEPARIN SODIUM (PORCINE) 5000 UNIT/ML IJ SOLN
5000.0000 [IU] | Freq: Three times a day (TID) | INTRAMUSCULAR | Status: DC
  Administered 2015-12-27 – 2015-12-28 (×3): 5000 [IU] via SUBCUTANEOUS
  Filled 2015-12-27 (×5): qty 1

## 2015-12-27 MED ORDER — TRIAMCINOLONE ACETONIDE 0.1 % EX CREA
1.0000 | TOPICAL_CREAM | Freq: Two times a day (BID) | CUTANEOUS | Status: DC | PRN
Start: 2015-12-27 — End: 2015-12-28
  Filled 2015-12-27: qty 15

## 2015-12-27 MED ORDER — ATORVASTATIN CALCIUM 40 MG PO TABS
40.0000 mg | ORAL_TABLET | Freq: Every day | ORAL | Status: DC
  Administered 2015-12-27: 40 mg via ORAL
  Filled 2015-12-27 (×3): qty 1

## 2015-12-27 NOTE — Progress Notes (Signed)
Pt with bruising on both sides of forehead Pt stares at Cm with mumbling that is not understood by CM when reviewed medicare forms. Unable to sign at this time

## 2015-12-27 NOTE — ED Provider Notes (Addendum)
CSN: [id number]     Arrival date & time 12/27/15  1440 History   First MD Initiated Contact with Patient 12/27/15 1504     Chief Complaint  Patient presents with  . Hypernatremia      (Consider location/radiation/quality/duration/timing/severity/associated sxs/prior Treatment) HPI Comments: Patient is a 82 year old female with a history of dementia who at this point is almost completely nonverbal being sent in by her care facility for persistent hyponatremia. Patient apparently was found to be hypernatremic on 3/23 and 3/24 and on those days was given an IV fluid bolus however patient has continued to have frequent falls and was seen in the emergency room last night and cleared with negative imaging scans however facility check patient's sodium today was 155. She was given no further intervention prior to transfer to the emergency room. Patient is unable to give any history and does not speak. No one is with her.  The history is provided by the EMS personnel and the nursing home. The history is limited by the absence of a caregiver and the condition of the patient.    Past Medical History  Diagnosis Date  . Thyroid disease   . Hypertension   . Pneumonia 04/19/12  . Hypothyroidism   . Dementia    Past Surgical History  Procedure Laterality Date  . Abdominal hysterectomy    . Appendectomy     Family History  Problem Relation Age of Onset  . Diabetes Son   . Heart disease Son    Social History  Substance Use Topics  . Smoking status: Never Smoker   . Smokeless tobacco: None  . Alcohol Use: No   OB History    No data available     Review of Systems  Unable to perform ROS: Dementia      Allergies  Sulfonamide derivatives  Home Medications   Prior to Admission medications   Medication Sig Start Date End Date Taking? Authorizing Provider  acetaminophen (TYLENOL) 325 MG tablet Take 650 mg by mouth every 4 (four) hours as needed (for pain).    Historical Provider, MD    aspirin 325 MG tablet Take 1 tablet (325 mg total) by mouth daily. 11/03/14   Costin Otelia SergeantM Gherghe, MD  atorvastatin (LIPITOR) 40 MG tablet Take 1 tablet (40 mg total) by mouth daily at 6 PM. 11/03/14   Costin Otelia SergeantM Gherghe, MD  escitalopram (LEXAPRO) 5 MG tablet Take 1 tablet (5 mg total) by mouth daily. 05/11/15   Sharee Holstereborah S Green, NP  levofloxacin (LEVAQUIN) 750 MG tablet Take 750 mg by mouth daily. Started 03/22 for 10 days    Historical Provider, MD  levothyroxine (SYNTHROID, LEVOTHROID) 112 MCG tablet Take 112 mcg by mouth daily before breakfast.    Historical Provider, MD  loratadine (CLARITIN) 10 MG tablet Take 10 mg by mouth daily.    Historical Provider, MD  magnesium hydroxide (MILK OF MAGNESIA) 400 MG/5ML suspension Take 30 mLs by mouth daily as needed for mild constipation or moderate constipation.    Historical Provider, MD  memantine (NAMENDA XR) 28 MG CP24 24 hr capsule Take 1 capsule (28 mg total) by mouth daily. 05/11/15   Sharee Holstereborah S Green, NP  Nutritional Supplements (NUTRITIONAL SUPPLEMENT PO) Take 60 mLs by mouth 3 (three) times daily.    Historical Provider, MD  Propylene Glycol (SYSTANE BALANCE) 0.6 % SOLN Place 1 drop into both eyes 3 (three) times daily.    Historical Provider, MD  saccharomyces boulardii (FLORASTOR) 250 MG capsule Take 250  mg by mouth 2 (two) times daily. Started 03/23 for 3 weeks    Historical Provider, MD  triamcinolone cream (KENALOG) 0.1 % Apply 1 application topically 2 (two) times daily as needed (for rash).    Historical Provider, MD   BP 130/59 mmHg  Pulse 78  Temp(Src) 97.7 F (36.5 C) (Oral)  Resp 16  SpO2 100% Physical Exam  Constitutional: She is oriented to person, place, and time. She appears well-developed and well-nourished. No distress.  HENT:  Head: Normocephalic.    Mouth/Throat: Oropharynx is clear and moist. Mucous membranes are dry. Oral lesions present.  Multiple contusions over the face in various stages of healing. Several oral lesions  present and white exudate consistent with thrush  Eyes: Conjunctivae and EOM are normal. Pupils are equal, round, and reactive to light.  Neck: Normal range of motion. Neck supple.  Cardiovascular: Normal rate and intact distal pulses.  An irregularly irregular rhythm present.  Murmur heard. Pulmonary/Chest: Effort normal and breath sounds normal. No respiratory distress. She has no wheezes. She has no rales.  Abdominal: Soft. She exhibits no distension. There is no tenderness. There is no rebound and no guarding.  Musculoskeletal: Normal range of motion. She exhibits no edema or tenderness.  Neurological: She is alert and oriented to person, place, and time.  Skin: Skin is warm and dry. No rash noted. No erythema.  Psychiatric: She has a normal mood and affect. Her behavior is normal.  Nursing note and vitals reviewed.   ED Course  Procedures (including critical care time) Labs Review Labs Reviewed  CBC WITH DIFFERENTIAL/PLATELET  COMPREHENSIVE METABOLIC PANEL  PHOSPHORUS  MAGNESIUM  URINALYSIS, ROUTINE W REFLEX MICROSCOPIC (NOT AT [facility])    Imaging Review Ct Head Wo Contrast  12/26/2015  CLINICAL DATA:  FALL FROM HER WHEELCHAIR. today PT HAS A LACERATION TO THE TOP-RIGHT SIDE OF HER HEAD. PT HAS MULTIPLE OLD BRUISING ALL OVER FROM PREVIOUS FALLS. EXAM: CT HEAD WITHOUT CONTRAST TECHNIQUE: Contiguous axial images were obtained from the base of the skull through the vertex without intravenous contrast. COMPARISON:  12/23/2015 FINDINGS: The ventricles are normal configuration. There is ventricular and sulcal enlargement reflecting moderate fused atrophy. There is no hydrocephalus. There are no parenchymal masses or mass effect. There is no evidence of a cortical infarct. White matter hypoattenuation is noted consistent with mild chronic microvascular ischemic change. There are no extra-axial masses or abnormal fluid collections. There is no intracranial hemorrhage. There are are right larger  than left frontal scalp hematomas. No skull fracture. Small amount of dependent fluid left sphenoid sinus. Remaining visualized sinuses and mastoid air cells are clear. IMPRESSION: 1. No acute intracranial abnormalities.  No intracranial hemorrhage. 2. Bilateral frontal scalp hematomas, larger on the left. No skull fracture. 3. Moderate atrophy and mild chronic microvascular ischemic change. Electronically Signed   By: Amie Portland M.D.   On: 12/26/2015 19:24   I have personally reviewed and evaluated these images and lab results as part of my medical decision-making.   EKG Interpretation None      MDM   Final diagnoses:  Hypernatremia  Dehydration   Patient is an 82 year old female being sent in by care facility today due to persistent hypernatremia over the last 1 week. Patient was given 2 fluid boluses of half-normal saline on 3/23 and 3/24 presenting today by EMS for persistent hypernatremia. Unclear if patient is eating or drinking. She is unable to give any history but has signs of multiple falls with healing ecchymosis  over her face and forehead. Multiple skin tears in various stages of healing over her bilateral upper arms. No other evidence of injury on her torso or lower extremities. Patient can minimally follow commands but her mouth is extremely dry with evidence of possible early thrush. Feel the patient's hypernatremia may be due to poor by mouth intake. Will confirm labs given IV fluids.   4:52 PM Patient found to be hyponatremic with mild acute kidney injury. No other acute findings. This is most likely from decreased by mouth intake.   Gwyneth Sprout, MD 12/27/15 1652  Gwyneth Sprout, MD 12/27/15 1710

## 2015-12-27 NOTE — ED Notes (Addendum)
Per PTAR pt from [facility] sent for reevaluation related to recent falls and hypernatremia; pt seen yesterday for same complaint. Recent labs collected today 1205 sodium 155; pt was given 0.45% NaCl infusions for hypernatremia 3/23 and 3/24 without pt improvement and continued decline in status. Pt hx of dementia verbal disorientation x4; nonverbal at present time.

## 2015-12-27 NOTE — ED Notes (Signed)
Tech at bedside collecting labs 

## 2015-12-27 NOTE — ED Notes (Addendum)
Full arm length abrasion/skin tear noted to right arm, small abrasion/skin tear noted to left elbow, small abrasion/skin tear noted to left forearm ALL dressed with petrolatum gaze surrounded by dry guaze in disarray. New dressing reapplied. Generalized scabs; dermatology appointment scheduled for next week (per PTAR).

## 2015-12-27 NOTE — H&P (Signed)
[facility] History and Physical  Kristie MannsLillie K Lanigan ZOX:[id number] DOB: 10/20/1925 DOA: 12/27/2015   PCP: Margit HanksALEXANDER, ANNE D, MD    Chief Complaint: sent for hypernatremia  HPI: Kristie Gonzales is a 82 y.o. female with alzheimers dementia, HTN, hypothyroidism sent from SNF for hypernatremia. She is non-verbal and history is obtained from ER doctor who obtained it from EMS. She was in the ER for this yesterday. She has been given 1/2 NS boluses over the past couple days but sodium has not improved. She is being admitted for hypernatremia. I spoke with her daughter over the phone about the patient's appetite recently- she tells me that her appetite has been poor. She has been going down hill this week but she does not know much more as the patient does not live with her.  She does states that she has had a rash for 2 months and a dermatologist is supposed to come to the facility to help diagnose it.   ROS: non-verbal  Past Medical History  Diagnosis Date  . Thyroid disease   . Hypertension   . Pneumonia 04/19/12  . Hypothyroidism   . Dementia     Past Surgical History  Procedure Laterality Date  . Abdominal hysterectomy    . Appendectomy      Social History[facility]    Allergies  Allergen Reactions  . Sulfonamide Derivatives Other (See Comments)    Unknown allergic reaction    Family history:   Family History  Problem Relation Age of Onset  . Diabetes Son   . Heart disease Son       Prior to Admission medications   Medication Sig Start Date End Date Taking? Authorizing Provider  acetaminophen (TYLENOL) 325 MG tablet Take 650 mg by mouth every 4 (four) hours as needed (for pain).   Yes Historical Provider, MD  aspirin 325 MG tablet Take 1 tablet (325 mg total) by mouth daily. 11/03/14  Yes Costin Otelia SergeantM Gherghe, MD  atorvastatin (LIPITOR) 40 MG tablet Take 1 tablet (40 mg total) by mouth daily at 6 PM. 11/03/14  Yes Costin Otelia SergeantM Gherghe, MD  escitalopram  (LEXAPRO) 5 MG tablet Take 1 tablet (5 mg total) by mouth daily. 05/11/15  Yes Sharee Holstereborah S Green, NP  levofloxacin (LEVAQUIN) 750 MG tablet Take 750 mg by mouth daily. Started 03/22 for 10 days   Yes Historical Provider, MD  levothyroxine (SYNTHROID, LEVOTHROID) 112 MCG tablet Take 112 mcg by mouth daily before breakfast.   Yes Historical Provider, MD  loratadine (CLARITIN) 10 MG tablet Take 10 mg by mouth daily.   Yes Historical Provider, MD  magnesium hydroxide (MILK OF MAGNESIA) 400 MG/5ML suspension Take 30 mLs by mouth daily as needed for mild constipation or moderate constipation.   Yes Historical Provider, MD  memantine (NAMENDA XR) 28 MG CP24 24 hr capsule Take 1 capsule (28 mg total) by mouth daily. 05/11/15  Yes Sharee Holstereborah S Green, NP  Nutritional Supplements (NUTRITIONAL SUPPLEMENT PO) Take 60 mLs by mouth 3 (three) times daily.   Yes Historical Provider, MD  Propylene Glycol (SYSTANE BALANCE) 0.6 % SOLN Place 1 drop into both eyes 3 (three) times daily.   Yes Historical Provider, MD  saccharomyces boulardii (FLORASTOR) 250 MG capsule Take 250 mg by mouth 2 (two) times daily. Started 03/23 for 3 weeks   Yes Historical Provider, MD  triamcinolone cream (KENALOG) 0.1 % Apply 1 application topically 2 (two) times daily as needed (for rash).  Yes Historical Provider, MD     Physical Exam: Filed Vitals:   12/27/15 1440 12/27/15 1458 12/27/15 1654  BP:  130/59 128/52  Pulse:  78 83  Temp:  97.7 F (36.5 C)   TempSrc:  Oral   Resp:  16 16  SpO2: 98% 100% 99%     General: alert, non-verbal HEENT: bruising around the lateral corners of her eyes, Mucous membranes pink                PERRLA; EOM intact; No scleral icterus,                 Nares: Patent, Oropharynx: Clear, Fair Dentition                 Neck: FROM, no cervical lymphadenopathy, thyromegaly, carotid bruit or JVD;  Breasts: deferred CHEST WALL: No tenderness  CHEST: Normal respiration, clear to auscultation bilaterally  HEART:  Regular rate and rhythm; no murmurs rubs or gallops  BACK: No kyphosis or scoliosis; no CVA tenderness  GI: Positive Bowel Sounds, soft, non-tender; no masses, no organomegaly Rectal Exam: deferred MSK: No cyanosis, clubbing, or edema Genitalia: not examined  SKIN:  no rash or ulceration - rash noted- purplish scab like on arms and legs- denuded skin where scabs have come off CNS: Alert and Oriented x 4, Nonfocal exam, CN 2-12 intact  Labs on Admission:  Basic Metabolic Panel:  Recent Labs Lab 12/27/15 1604  NA 152*  K 4.1  CL 120*  CO2 24  GLUCOSE 105*  BUN 39*  CREATININE 1.97*  CALCIUM 8.5*  MG 2.3  PHOS 2.6   Liver Function Tests:  Recent Labs Lab 12/27/15 1604  AST 21  ALT 16  ALKPHOS 117  BILITOT 0.5  PROT 6.4*  ALBUMIN 2.9*   No results for input(s): LIPASE, AMYLASE in the last 168 hours. No results for input(s): AMMONIA in the last 168 hours. CBC:  Recent Labs Lab 12/27/15 1604  WBC 14.5*  NEUTROABS 8.0*  HGB 13.9  HCT 43.3  MCV 95.8  PLT 252   Cardiac Enzymes: No results for input(s): CKTOTAL, CKMB, CKMBINDEX, TROPONINI in the last 168 hours.  BNP (last 3 results) No results for input(s): BNP in the last 8760 hours.  ProBNP (last 3 results) No results for input(s): PROBNP in the last 8760 hours.  CBG: No results for input(s): GLUCAP in the last 168 hours.  Radiological Exams on Admission: Ct Head Wo Contrast  12/26/2015  CLINICAL DATA:  FALL FROM HER WHEELCHAIR. today PT HAS A LACERATION TO THE TOP-RIGHT SIDE OF HER HEAD. PT HAS MULTIPLE OLD BRUISING ALL OVER FROM PREVIOUS FALLS. EXAM: CT HEAD WITHOUT CONTRAST TECHNIQUE: Contiguous axial images were obtained from the base of the skull through the vertex without intravenous contrast. COMPARISON:  12/23/2015 FINDINGS: The ventricles are normal configuration. There is ventricular and sulcal enlargement reflecting moderate fused atrophy. There is no hydrocephalus. There are no parenchymal masses  or mass effect. There is no evidence of a cortical infarct. White matter hypoattenuation is noted consistent with mild chronic microvascular ischemic change. There are no extra-axial masses or abnormal fluid collections. There is no intracranial hemorrhage. There are are right larger than left frontal scalp hematomas. No skull fracture. Small amount of dependent fluid left sphenoid sinus. Remaining visualized sinuses and mastoid air cells are clear. IMPRESSION: 1. No acute intracranial abnormalities.  No intracranial hemorrhage. 2. Bilateral frontal scalp hematomas, larger on the left. No skull fracture. 3. Moderate atrophy  and mild chronic microvascular ischemic change. Electronically Signed   By: Amie Portland M.D.   On: 12/26/2015 19:24   Dg Chest Port 1 View  12/27/2015  CLINICAL DATA:  Recent falls with hypernatremia. EXAM: PORTABLE CHEST 1 VIEW COMPARISON:  05/12/2014 and 05/17/2012. FINDINGS: 1517 hours. Patient is mildly rotated, and the mandible overlies the lung apices. The aeration of the lingula has improved. The lungs are otherwise clear. There is no edema, pleural effusion or pneumothorax. The heart size and mediastinal contours are stable. The subacromial space of the right shoulder is mildly narrowed. IMPRESSION: No acute cardiopulmonary process.  Improved aeration of the lingula. Electronically Signed   By: Carey Bullocks M.D.   On: 12/27/2015 15:30      Assessment/Plan Principal Problem:   Hypernatremia - getting NS bolus by ER- apparently 1/2 NS has not helped- will start D5 W  Active Problems: Leukocytosis - no infection found- on Levaquin at facility-last note from 3/23 from facility does not mention why- will continue     CKD (chronic kidney disease) stage 3, GFR 30-59 ml/min    Rash and nonspecific skin eruption  -  Outpatient management  Vascular dementia - non-verbal, does not follow commands   Consulted:   Code Status: DNR Family Communication: daughter  DVT  Prophylaxis: Heparin  Time spent: 50 min  Casondra Gasca, MD [facility]  If 7PM-7AM, please contact night-coverage [email or web address] 12/27/2015, 6:08 PM

## 2015-12-27 NOTE — ED Notes (Signed)
Hospitalist at bedside 

## 2015-12-28 DIAGNOSIS — I10 Essential (primary) hypertension: Secondary | ICD-10-CM

## 2015-12-28 DIAGNOSIS — E860 Dehydration: Secondary | ICD-10-CM

## 2015-12-28 LAB — BASIC METABOLIC PANEL
ANION GAP: 9 (ref 5–15)
BUN: 29 mg/dL — ABNORMAL HIGH (ref 6–20)
CO2: 23 mmol/L (ref 22–32)
Calcium: 8 mg/dL — ABNORMAL LOW (ref 8.9–10.3)
Chloride: 119 mmol/L — ABNORMAL HIGH (ref 101–111)
Creatinine, Ser: 1.7 mg/dL — ABNORMAL HIGH (ref 0.44–1.00)
GFR calc Af Amer: 30 mL/min — ABNORMAL LOW (ref >60)
GFR calc non Af Amer: 26 mL/min — ABNORMAL LOW (ref >60)
GLUCOSE: 121 mg/dL — AB (ref 65–99)
POTASSIUM: 3.6 mmol/L (ref 3.5–5.1)
Sodium: 151 mmol/L — ABNORMAL HIGH (ref 135–145)

## 2015-12-28 LAB — CBC
HEMATOCRIT: 37.9 % (ref 36.0–46.0)
HEMOGLOBIN: 12.2 g/dL (ref 12.0–15.0)
MCH: 30.9 pg (ref 26.0–34.0)
MCHC: 32.2 g/dL (ref 30.0–36.0)
MCV: 95.9 fL (ref 78.0–100.0)
Platelets: 254 10*3/uL (ref 150–400)
RBC: 3.95 MIL/uL (ref 3.87–5.11)
RDW: 14.4 % (ref 11.5–15.5)
WBC: 11 10*3/uL — ABNORMAL HIGH (ref 4.0–10.5)

## 2015-12-28 LAB — MRSA PCR SCREENING: MRSA by PCR: NEGATIVE

## 2015-12-28 NOTE — Clinical Social Work Note (Signed)
Clinical Social Work Assessment  Patient Details  Name: Kristie Gonzales MRN: [id number] Date of Birth: 06/06/1926  Date of referral:  12/28/15               Reason for consult:  Facility Placement                Permission sought to share information with:  Facility Industrial/product designerContact Representative Permission granted to share information::  Yes, Verbal Permission Granted  Name::        Agency::     Relationship::     Contact Information:     Housing/Transportation Living arrangements for the past 2 months:  Skilled Nursing Facility Source of Information:  Adult Children Patient Interpreter Needed:  None Criminal Activity/Legal Involvement Pertinent to Current Situation/Hospitalization:  No - Comment as needed Significant Relationships:  Adult Children Lives with:  Facility Resident Do you feel safe going back to the place where you live?  Yes Need for family participation in patient care:  Yes (Comment)  Care giving concerns:  CSW received consult that [facility].    Social Worker assessment / plan:  CSW spoke with patient's daughter, Kristie Gonzales (home#: [phone]) to confirm that patient will [facility] when discharged. CSW confirmed with [facility]Latasha [facility] that [facility] would be able to take patient back when ready.   Employment status:  Retired Health and safety inspectornsurance information:  Medicare PT Recommendations:  Not assessed at this time Information / Referral to community resources:  Skilled Nursing Facility  Patient/Family's Response to care:  Patient's daughter states that she has been pleased with [facility] and would prefer that she return.   Patient/Family's Understanding of and Emotional Response to Diagnosis, Current Treatment, and Prognosis: Patient's daughter states that she would prefer to have her return sooner rather than later.   Emotional Assessment Appearance:  Appears stated age Attitude/Demeanor/Rapport:    Affect  (typically observed):    Orientation:    Alcohol / Substance use:    Psych involvement (Current and /or in the community):     Discharge Needs  Concerns to be addressed:    Readmission within the last 30 days:    Current discharge risk:    Barriers to Discharge:      Arlyss RepressHarrison, Margree Gimbel F, LCSW 12/28/2015, 11:28 AM

## 2015-12-28 NOTE — Discharge Summary (Addendum)
Physician Discharge Summary  Kristie MannsLillie K Fenster ZOX:[id number] DOB: 01/24/1926 DOA: 12/27/2015  PCP: Margit HanksALEXANDER, ANNE D, MD  Admit date: 12/27/2015 Discharge date: 12/28/2015  Time spent: 50 minutes  Recommendations for Outpatient Follow-up:  1. Return to SNF with hospice - do not readmit to the hospital per daughters request 2. Continue to assist with feeds as long as patient is alert  Discharge Condition: fair    Discharge Diagnoses:  Principal Problem:   Hypernatremia Active Problems:   CKD (chronic kidney disease) stage 3, GFR 30-59 ml/min   Rash and nonspecific skin eruption   History of present illness:  Kristie Gonzales is a 82 y.o. female with alzheimers dementia, HTN, hypothyroidism sent from SNF for hypernatremia. She is non-verbal and history is obtained from ER doctor who obtained it from EMS. She was in the ER for this yesterday. She has been given 1/2 NS boluses over the past couple days but sodium has not improved. She is being admitted for hypernatremia. I spoke with her daughter over the phone about the patient's appetite recently- she tells me that her appetite has been poor. She has been going down hill this week but she does not know much more as the patient does not live with her. She does states that she has had a rash for 2 months and a dermatologist is supposed to come to the facility to help diagnose it.   Hospital Course:  Principal Problem:  Hypernatremia - poor PO intake - sodium has only come down slightly with D5W-  ate 50 % of breakfast- almost 500 cc of fluids drank- have asked RNs to feed her.   Active Problems:  CKD (chronic kidney disease) stage 3, GFR 30-59 ml/min - stable   Rash and nonspecific skin eruption   Disposition: - daughter, [name] who is POA,  tells me today that she thought about it last night and rememeber's that her mother did not want anything to prolong her life- I have discussed comfort care which she tells me is what  her mother has wanted. She states that her mother's mental state is not going to get any better and she would like for nature to take its course. She does not want her re-admitted to the hospital.     Discharge Exam: There were no vitals filed for this visit. Filed Vitals:   12/28/15 0453 12/28/15 1522  BP: 107/47 116/71  Pulse: 72 74  Temp: 98.3 F (36.8 C)   Resp: 16 18    General: AAO x 3, no distress Cardiovascular: RRR, no murmurs  Respiratory: clear to auscultation bilaterally GI: soft, non-tender, non-distended, bowel sound positive  Discharge Instructions You were cared for by a hospitalist during your hospital stay. If you have any questions about your discharge medications or the care you received while you were in the hospital after you are discharged, you can call the unit and asked to speak with the hospitalist on call if the hospitalist that took care of you is not available. Once you are discharged, your primary care physician will handle any further medical issues. Please note that NO REFILLS for any discharge medications will be authorized once you are discharged, as it is imperative that you return to your primary care physician (or establish a relationship with a primary care physician if you do not have one) for your aftercare needs so that they can reassess your need for medications and monitor your lab values.     Medication List  ASK your doctor about these medications        acetaminophen 325 MG tablet  Commonly known as:  TYLENOL  Take 650 mg by mouth every 4 (four) hours as needed (for pain).     aspirin 325 MG tablet  Take 1 tablet (325 mg total) by mouth daily.     atorvastatin 40 MG tablet  Commonly known as:  LIPITOR  Take 1 tablet (40 mg total) by mouth daily at 6 PM.     escitalopram 5 MG tablet  Commonly known as:  LEXAPRO  Take 1 tablet (5 mg total) by mouth daily.     levofloxacin 750 MG tablet  Commonly known as:  LEVAQUIN  Take 750  mg by mouth daily. Started 03/22 for 10 days     levothyroxine 112 MCG tablet  Commonly known as:  SYNTHROID, LEVOTHROID  Take 112 mcg by mouth daily before breakfast.     loratadine 10 MG tablet  Commonly known as:  CLARITIN  Take 10 mg by mouth daily.     memantine 28 MG Cp24 24 hr capsule  Commonly known as:  NAMENDA XR  Take 1 capsule (28 mg total) by mouth daily.     MILK OF MAGNESIA 400 MG/5ML suspension  Generic drug:  magnesium hydroxide  Take 30 mLs by mouth daily as needed for mild constipation or moderate constipation.     NUTRITIONAL SUPPLEMENT PO  Take 60 mLs by mouth 3 (three) times daily.     saccharomyces boulardii 250 MG capsule  Commonly known as:  FLORASTOR  Take 250 mg by mouth 2 (two) times daily. Started 03/23 for 3 weeks     SYSTANE BALANCE 0.6 % Soln  Generic drug:  Propylene Glycol  Place 1 drop into both eyes 3 (three) times daily.     triamcinolone cream 0.1 %  Commonly known as:  KENALOG  Apply 1 application topically 2 (two) times daily as needed (for rash).       Allergies  Allergen Reactions  . Sulfonamide Derivatives Other (See Comments)    Unknown allergic reaction      The results of significant diagnostics from this hospitalization (including imaging, microbiology, ancillary and laboratory) are listed below for reference.    Significant Diagnostic Studies: Ct Head Wo Contrast  12/26/2015  CLINICAL DATA:  FALL FROM HER WHEELCHAIR. today PT HAS A LACERATION TO THE TOP-RIGHT SIDE OF HER HEAD. PT HAS MULTIPLE OLD BRUISING ALL OVER FROM PREVIOUS FALLS. EXAM: CT HEAD WITHOUT CONTRAST TECHNIQUE: Contiguous axial images were obtained from the base of the skull through the vertex without intravenous contrast. COMPARISON:  12/23/2015 FINDINGS: The ventricles are normal configuration. There is ventricular and sulcal enlargement reflecting moderate fused atrophy. There is no hydrocephalus. There are no parenchymal masses or mass effect. There is  no evidence of a cortical infarct. White matter hypoattenuation is noted consistent with mild chronic microvascular ischemic change. There are no extra-axial masses or abnormal fluid collections. There is no intracranial hemorrhage. There are are right larger than left frontal scalp hematomas. No skull fracture. Small amount of dependent fluid left sphenoid sinus. Remaining visualized sinuses and mastoid air cells are clear. IMPRESSION: 1. No acute intracranial abnormalities.  No intracranial hemorrhage. 2. Bilateral frontal scalp hematomas, larger on the left. No skull fracture. 3. Moderate atrophy and mild chronic microvascular ischemic change. Electronically Signed   By: Amie Portland M.D.   On: 12/26/2015 19:24   Ct Head Wo Contrast  12/23/2015  CLINICAL  DATA:  Fell yesterday, now with altered mental status, bruise on the left forehead EXAM: CT HEAD WITHOUT CONTRAST TECHNIQUE: Contiguous axial images were obtained from the base of the skull through the vertex without intravenous contrast. COMPARISON:  CT brain scan of 06/25/2015 FINDINGS: The ventricular system is prominent as are the cortical sulci, consistent with diffuse atrophy. The septum is midline in position. Moderate small vessel ischemic changes again noted throughout the periventricular white matter. No hemorrhage, mass lesion, or acute infarction is seen. On bone window images, there is a high left frontal scalp hematoma present. No calvarial fracture is seen. In addition, there is air-fluid level within the sphenoid sinus. This may represent incidental sphenoid sinus disease with no evidence of basilar skull fracture by CT. IMPRESSION: 1. No acute intracranial abnormality. 2. Atrophy and moderate small vessel ischemic change. 3. High left frontal scalp hematoma. 4. Air-fluid level in the sphenoid sinus. Probable sinusitis. No evidence of basilar skull fracture. Electronically Signed   By: Dwyane Dee M.D.   On: 12/23/2015 13:39   Dg Chest Port  1 View  12/27/2015  CLINICAL DATA:  Recent falls with hypernatremia. EXAM: PORTABLE CHEST 1 VIEW COMPARISON:  05/12/2014 and 05/17/2012. FINDINGS: 1517 hours. Patient is mildly rotated, and the mandible overlies the lung apices. The aeration of the lingula has improved. The lungs are otherwise clear. There is no edema, pleural effusion or pneumothorax. The heart size and mediastinal contours are stable. The subacromial space of the right shoulder is mildly narrowed. IMPRESSION: No acute cardiopulmonary process.  Improved aeration of the lingula. Electronically Signed   By: Carey Bullocks M.D.   On: 12/27/2015 15:30    Microbiology: Recent Results (from the past 240 hour(s))  MRSA PCR Screening     Status: None   Collection Time: 12/28/15  1:10 AM  Result Value Ref Range Status   MRSA by PCR NEGATIVE NEGATIVE Final    Comment:        The GeneXpert MRSA Assay (FDA approved for NASAL specimens only), is one component of a comprehensive MRSA colonization surveillance program. It is not intended to diagnose MRSA infection nor to guide or monitor treatment for MRSA infections.      Labs: Basic Metabolic Panel:  Recent Labs Lab 12/27/15 1604 12/28/15 0804  NA 152* 151*  K 4.1 3.6  CL 120* 119*  CO2 24 23  GLUCOSE 105* 121*  BUN 39* 29*  CREATININE 1.97* 1.70*  CALCIUM 8.5* 8.0*  MG 2.3  --   PHOS 2.6  --    Liver Function Tests:  Recent Labs Lab 12/27/15 1604  AST 21  ALT 16  ALKPHOS 117  BILITOT 0.5  PROT 6.4*  ALBUMIN 2.9*   No results for input(s): LIPASE, AMYLASE in the last 168 hours. No results for input(s): AMMONIA in the last 168 hours. CBC:  Recent Labs Lab 12/27/15 1604 12/28/15 0828  WBC 14.5* 11.0*  NEUTROABS 8.0*  --   HGB 13.9 12.2  HCT 43.3 37.9  MCV 95.8 95.9  PLT 252 254   Cardiac Enzymes: No results for input(s): CKTOTAL, CKMB, CKMBINDEX, TROPONINI in the last 168 hours. BNP: BNP (last 3 results) No results for input(s): BNP in the  last 8760 hours.  ProBNP (last 3 results) No results for input(s): PROBNP in the last 8760 hours.  CBG: No results for input(s): GLUCAP in the last 168 hours.     SignedCalvert Cantor, MD [facility] 12/28/2015, 3:49 PM

## 2015-12-28 NOTE — NC FL2 (Signed)
[place] MEDICAID FL2 LEVEL OF CARE SCREENING TOOL     IDENTIFICATION  Patient Name: Kristie Gonzales Birthdate: [birth year] Sex: female Admission Date (Current Location): 12/27/2015  [facility] and [place] Number:  Producer, television/film/video and Address:  [facility],  [address] [place] [address]      Provider Number: [id number]  Attending Physician Name and Address:  Calvert Cantor, MD  Relative Name and Phone Number:       Current Level of Care: Hospital Recommended Level of Care: Skilled Nursing Facility Prior Approval Number:    Date Approved/Denied:   PASRR Number: [id number]  Discharge Plan: SNF    Current Diagnoses: Patient Active Problem List   Diagnosis Date Noted  . Hypernatremia 12/27/2015  . Cellulitis 12/06/2015  . Rash and nonspecific skin eruption 11/13/2015  . Hyperlipidemia 07/29/2015  . Vascular dementia without behavioral disturbance 05/11/2015  . Depression due to dementia 05/11/2015  . Allergic rhinitis 11/26/2014  . TIA (transient ischemic attack) 10/31/2014  . Hypothyroidism 10/31/2014  . CKD (chronic kidney disease) stage 3, GFR 30-59 ml/min 10/31/2014    Orientation RESPIRATION BLADDER Height & Weight      (Disoriented x4)  Normal Incontinent Weight:   Height:     BEHAVIORAL SYMPTOMS/MOOD NEUROLOGICAL BOWEL NUTRITION STATUS  Other (Comment) (None)  (N/a) Incontinent (Incontinent @ times) Diet (Diet regular Room service appropriate; Fluid consistency:: Thin )  AMBULATORY STATUS COMMUNICATION OF NEEDS Skin     Non-Verbally (Nonverbal; very little response) Other (Comment) (Generalized skin tears; Location: Right and Left arm)                       Personal Care Assistance Level of Assistance  Bathing, Feeding, Dressing           Functional Limitations Info  Sight, Hearing, Speech Sight Info: Adequate Hearing Info: Adequate Speech Info: Impaired (Nonverbal; very little response)    SPECIAL CARE FACTORS  FREQUENCY                       Contractures Contractures Info: Not present    Additional Factors Info  Code Status, Allergies Code Status Info: DNR Allergies Info: Sulfonamide Derivatives           Current Medications (12/28/2015):  This is the current hospital active medication list Current Facility-Administered Medications  Medication Dose Route Frequency Provider Last Rate Last Dose  . acetaminophen (TYLENOL) tablet 650 mg  650 mg Oral Q6H PRN Calvert Cantor, MD       Or  . acetaminophen (TYLENOL) suppository 650 mg  650 mg Rectal Q6H PRN Calvert Cantor, MD      . aspirin tablet 325 mg  325 mg Oral Daily Calvert Cantor, MD   325 mg at 12/28/15 1047  . atorvastatin (LIPITOR) tablet 40 mg  40 mg Oral q1800 Calvert Cantor, MD   40 mg at 12/27/15 2357  . dextrose 5 % solution   Intravenous Continuous Calvert Cantor, MD 150 mL/hr at 12/28/15 0347    . escitalopram (LEXAPRO) tablet 5 mg  5 mg Oral Daily Calvert Cantor, MD   5 mg at 12/28/15 1046  . heparin injection 5,000 Units  5,000 Units Subcutaneous 3 times per day Calvert Cantor, MD   5,000 Units at 12/28/15 0507  . levofloxacin (LEVAQUIN) tablet 750 mg  750 mg Oral Daily Calvert Cantor, MD   750 mg at 12/28/15 1047  . levothyroxine (SYNTHROID, LEVOTHROID) tablet 112 mcg  112 mcg Oral QAC breakfast Calvert CantorSaima Rizwan, MD   112 mcg at 12/28/15 0808  . loratadine (CLARITIN) tablet 10 mg  10 mg Oral Daily Calvert CantorSaima Rizwan, MD   10 mg at 12/28/15 1047  . magnesium hydroxide (MILK OF MAGNESIA) suspension 30 mL  30 mL Oral Daily PRN Calvert CantorSaima Rizwan, MD      . memantine (NAMENDA XR) 24 hr capsule 28 mg  28 mg Oral Daily Calvert CantorSaima Rizwan, MD   28 mg at 12/28/15 1046  . ondansetron (ZOFRAN) tablet 4 mg  4 mg Oral Q6H PRN Calvert CantorSaima Rizwan, MD       Or  . ondansetron (ZOFRAN) injection 4 mg  4 mg Intravenous Q6H PRN Calvert CantorSaima Rizwan, MD      . polyvinyl alcohol (LIQUIFILM TEARS) 1.4 % ophthalmic solution 1 drop  1 drop Both Eyes TID Calvert CantorSaima Rizwan, MD   1 drop at 12/28/15 1048   . saccharomyces boulardii (FLORASTOR) capsule 250 mg  250 mg Oral BID Calvert CantorSaima Rizwan, MD   250 mg at 12/28/15 1046  . triamcinolone cream (KENALOG) 0.1 % 1 application  1 application Topical BID PRN Calvert CantorSaima Rizwan, MD         Discharge Medications: Please see discharge summary for a list of discharge medications.  Relevant Imaging Results:  Relevant Lab Results:   Additional Information SSN: [id number]  Scharlene GlossCallie Kamariah Fruchter, Student-SW [phone]

## 2015-12-28 NOTE — Progress Notes (Signed)
Pt discharged from the unit via [facility]. Discharge packet was given to transportation. Pt belonging were sent with the transporters. Pt disorientated x 4. [facility] and no one answered. Will continue to call and give report. No questions or concerns from the pt or family members at this time.  Chevie Birkhead W Yasira Engelson, RN

## 2015-12-28 NOTE — Progress Notes (Signed)
[facility] Progress Note   Kristie Gonzales  ZOX:[id number]  DOB: 08-09-1926  DOA: 12/27/2015 PCP: Margit Hanks, MD  Brief narrative: Kristie Gonzales is a 82 y.o. female with alzheimers dementia, HTN, hypothyroidism sent from SNF for hypernatremia. She is non-verbal and history is obtained from ER doctor who obtained it from EMS. She was in the ER for this yesterday. She has been given 1/2 NS boluses over the past couple days but sodium has not improved. She is being admitted for hypernatremia. I spoke with her daughter over the phone about the patient's appetite recently- she tells me that her appetite has been poor. She has been going down hill this week but she does not know much more as the patient does not live with her. She does states that she has had a rash for 2 months and a dermatologist is supposed to come to the facility to help diagnose it.    Subjective: Verbal today but does not make sense.   Assessment/Plan: Principal Problem:   Hypernatremia - poor PO intake - sodium has only come down slightly with D5W- she is beginning to eat- ate 50 % of breakfast- almost 500 cc of fluids drank- have asked RNs to feed her.   Active Problems:   CKD (chronic kidney disease) stage 3, GFR 30-59 ml/min - stable    Rash and nonspecific skin eruption    Disposition: - daughter tells me today that she thought about it last night that her mother did not want anything to prolong her life- I have discussed comfort care which she tells me is what her mother has wanted. She states that her mother's mental state is not going to get any better and she would like for nature to take its course.    Antibiotics: Anti-infectives    Start     Dose/Rate Route Frequency Ordered Stop   12/27/15 1815  levofloxacin (LEVAQUIN) tablet 750 mg     750 mg Oral Daily 12/27/15 1813       Code Status:     Code Status Orders        Start     Ordered   12/27/15 1737  Do not attempt  resuscitation (DNR)   Continuous    Question Answer Comment  In the event of cardiac or respiratory ARREST Do not call a "code blue"   In the event of cardiac or respiratory ARREST Do not perform Intubation, CPR, defibrillation or ACLS   In the event of cardiac or respiratory ARREST Use medication by any route, position, wound care, and other measures to relive pain and suffering. May use oxygen, suction and manual treatment of airway obstruction as needed for comfort.      12/27/15 1737    Code Status History    Date Active Date Inactive Code Status Order ID Comments User Context   10/31/2014  9:57 PM 11/03/2014  7:22 PM DNR [id number]  Inez Catalina, MD Inpatient   04/19/2012 10:33 PM 04/23/2012  6:00 PM Partial Code [id number]  Jamison Neighbor, RN Inpatient    Advance Directive Documentation        Most Recent Value   Type of Advance Directive  Out of facility DNR (pink MOST or yellow form)   Pre-existing out of facility DNR order (yellow form or pink MOST form)     "MOST" Form in Place?       Family Communication: daughter- Kristie Gonzales- POA Disposition Plan:  Possibly back to SNF  with hospice DVT prophylaxis:  Consultants:  Procedures:     Objective: There were no vitals filed for this visit.  Intake/Output Summary (Last 24 hours) at 12/28/15 1531 Last data filed at 12/28/15 1230  Gross per 24 hour  Intake 1092.5 ml  Output      0 ml  Net 1092.5 ml     Vitals Filed Vitals:   12/27/15 2128 12/28/15 0453 12/28/15 1500 12/28/15 1522  BP: 157/67 107/47  116/71  Pulse: 75 72  74  Temp: 98.1 F (36.7 C) 98.3 F (36.8 C)    TempSrc: Oral Oral    Resp: Height:    (1.575 m)   SpO2: 97% 98%  98%    Exam:  General:  Pt is alert, not in acute distress  HEENT: No icterus, No thrush, oral mucosa moist  Cardiovascular: regular rate and rhythm, S1/S2 No murmur  Respiratory: clear to auscultation bilaterally   Abdomen: Soft, +Bowel sounds, non tender, non  distended, no guarding  MSK: No cyanosis or clubbing- no pedal edema   Data Reviewed: Basic Metabolic Panel:  Recent Labs Lab 12/27/15 1604 12/28/15 0804  NA 152* 151*  K 4.1 3.6  CL 120* 119*  CO2 24 23  GLUCOSE 105* 121*  BUN 39* 29*  CREATININE 1.97* 1.70*  CALCIUM 8.5* 8.0*  MG 2.3  --   PHOS 2.6  --    Liver Function Tests:  Recent Labs Lab 12/27/15 1604  AST 21  ALT 16  ALKPHOS 117  BILITOT 0.5  PROT 6.4*  ALBUMIN 2.9*   No results for input(s): LIPASE, AMYLASE in the last 168 hours. No results for input(s): AMMONIA in the last 168 hours. CBC:  Recent Labs Lab 12/27/15 1604 12/28/15 0828  WBC 14.5* 11.0*  NEUTROABS 8.0*  --   HGB 13.9 12.2  HCT 43.3 37.9  MCV 95.8 95.9  PLT 252 254   Cardiac Enzymes: No results for input(s): CKTOTAL, CKMB, CKMBINDEX, TROPONINI in the last 168 hours. BNP (last 3 results) No results for input(s): BNP in the last 8760 hours.  ProBNP (last 3 results) No results for input(s): PROBNP in the last 8760 hours.  CBG: No results for input(s): GLUCAP in the last 168 hours.  Recent Results (from the past 240 hour(s))  MRSA PCR Screening     Status: None   Collection Time: 12/28/15  1:10 AM  Result Value Ref Range Status   MRSA by PCR NEGATIVE NEGATIVE Final    Comment:        The GeneXpert MRSA Assay (FDA approved for NASAL specimens only), is one component of a comprehensive MRSA colonization surveillance program. It is not intended to diagnose MRSA infection nor to guide or monitor treatment for MRSA infections.      Studies: Ct Head Wo Contrast  12/26/2015  CLINICAL DATA:  FALL FROM HER WHEELCHAIR. today PT HAS A LACERATION TO THE TOP-RIGHT SIDE OF HER HEAD. PT HAS MULTIPLE OLD BRUISING ALL OVER FROM PREVIOUS FALLS. EXAM: CT HEAD WITHOUT CONTRAST TECHNIQUE: Contiguous axial images were obtained from the base of the skull through the vertex without intravenous contrast. COMPARISON:  12/23/2015 FINDINGS: The  ventricles are normal configuration. There is ventricular and sulcal enlargement reflecting moderate fused atrophy. There is no hydrocephalus. There are no parenchymal masses or mass effect. There is no evidence of a cortical infarct. White matter hypoattenuation is noted consistent with mild chronic microvascular ischemic change. There are no extra-axial masses  or abnormal fluid collections. There is no intracranial hemorrhage. There are are right larger than left frontal scalp hematomas. No skull fracture. Small amount of dependent fluid left sphenoid sinus. Remaining visualized sinuses and mastoid air cells are clear. IMPRESSION: 1. No acute intracranial abnormalities.  No intracranial hemorrhage. 2. Bilateral frontal scalp hematomas, larger on the left. No skull fracture. 3. Moderate atrophy and mild chronic microvascular ischemic change. Electronically Signed   By: Amie Portlandavid  Ormond M.D.   On: 12/26/2015 19:24   Dg Chest Port 1 View  12/27/2015  CLINICAL DATA:  Recent falls with hypernatremia. EXAM: PORTABLE CHEST 1 VIEW COMPARISON:  05/12/2014 and 05/17/2012. FINDINGS: 1517 hours. Patient is mildly rotated, and the mandible overlies the lung apices. The aeration of the lingula has improved. The lungs are otherwise clear. There is no edema, pleural effusion or pneumothorax. The heart size and mediastinal contours are stable. The subacromial space of the right shoulder is mildly narrowed. IMPRESSION: No acute cardiopulmonary process.  Improved aeration of the lingula. Electronically Signed   By: Carey BullocksWilliam  Veazey M.D.   On: 12/27/2015 15:30    Scheduled Meds:  Scheduled Meds: . aspirin  325 mg Oral Daily  . atorvastatin  40 mg Oral q1800  . escitalopram  5 mg Oral Daily  . heparin  5,000 Units Subcutaneous 3 times per day  . levofloxacin  750 mg Oral Daily  . levothyroxine  112 mcg Oral QAC breakfast  . loratadine  10 mg Oral Daily  . memantine  28 mg Oral Daily  . polyvinyl alcohol  1 drop Both Eyes  TID  . saccharomyces boulardii  250 mg Oral BID   Continuous Infusions: . dextrose 150 mL/hr at 12/28/15 1336    Time spent on care of this patient: 35 min   Saiya Crist, MD 12/28/2015, 3:31 PM  LOS: 1 day   [facility] Office  [phone] Pager - Text Page per [email or web address] If 7PM-7AM, please contact night-coverage [email or web address]

## 2015-12-28 NOTE — Progress Notes (Signed)
[facility] is set [facility] today. Patient & daughter, Kristie Gonzales aware. Discharge packet given to RN, Abby. [facility] called for transport.     Kristie MaxinKelly Khamari Sheehan, LCSW [facility] Clinical Social Worker cell #: [phone]

## 2015-12-29 ENCOUNTER — Non-Acute Institutional Stay (SKILLED_NURSING_FACILITY): Payer: Medicare Other | Admitting: Adult Health

## 2015-12-29 ENCOUNTER — Encounter: Payer: Self-pay | Admitting: Adult Health

## 2015-12-29 DIAGNOSIS — E870 Hyperosmolality and hypernatremia: Secondary | ICD-10-CM | POA: Diagnosis not present

## 2015-12-29 DIAGNOSIS — N183 Chronic kidney disease, stage 3 unspecified: Secondary | ICD-10-CM

## 2015-12-29 DIAGNOSIS — F0280 Dementia in other diseases classified elsewhere without behavioral disturbance: Secondary | ICD-10-CM

## 2015-12-29 DIAGNOSIS — E034 Atrophy of thyroid (acquired): Secondary | ICD-10-CM

## 2015-12-29 DIAGNOSIS — J189 Pneumonia, unspecified organism: Secondary | ICD-10-CM | POA: Insufficient documentation

## 2015-12-29 DIAGNOSIS — F0393 Unspecified dementia, unspecified severity, with mood disturbance: Secondary | ICD-10-CM

## 2015-12-29 DIAGNOSIS — F329 Major depressive disorder, single episode, unspecified: Secondary | ICD-10-CM

## 2015-12-29 DIAGNOSIS — E038 Other specified hypothyroidism: Secondary | ICD-10-CM

## 2015-12-29 DIAGNOSIS — F0150 Vascular dementia without behavioral disturbance: Secondary | ICD-10-CM

## 2015-12-29 NOTE — Progress Notes (Signed)
Patient ID: Kristie Gonzales, female   DOB: 10/20/1925, 82 y.o.   MRN: [id number]   Facility:  [facility]       Allergies  Allergen Reactions  . Sulfonamide Derivatives Other (See Comments)    Unknown allergic reaction    Chief Complaint  Patient presents with  . Hospitalization Follow-up    Hospital Follow up    HPI:  She is a long term resident of this facility being seen after she was taken to the hospital due to her hypernatremia. Her daughter and the ED physician discussed her overall condition and decided the best coarse of action was to have her return back to the facility I have spoken to her daughter at great length regarding her overall status and prognosis and have decided upon a hospice consult.    Past Medical History  Diagnosis Date  . Thyroid disease   . Hypertension   . Pneumonia 04/19/12  . Hypothyroidism   . Dementia     Past Surgical History  Procedure Laterality Date  . Abdominal hysterectomy    . Appendectomy      VITAL SIGNS BP 144/64 mmHg  Pulse 88  Temp(Src) 97.8 F (36.6 C) (Oral)  Resp 20  Ht 5\' 2"  (1.575 m)  Wt 166 lb 4 oz (75.411 kg)  BMI 30.40 kg/m2  SpO2 97%  Patient's Medications  New Prescriptions   No medications on file  Previous Medications   ACETAMINOPHEN (TYLENOL) 325 MG TABLET    Take 650 mg by mouth every 4 (four) hours as needed (for pain).   ASPIRIN 325 MG TABLET    Take 1 tablet (325 mg total) by mouth daily.   ATORVASTATIN (LIPITOR) 40 MG TABLET    Take 1 tablet (40 mg total) by mouth daily at 6 PM.   ESCITALOPRAM (LEXAPRO) 5 MG TABLET    Take 1 tablet (5 mg total) by mouth daily.   LEVOFLOXACIN (LEVAQUIN) 750 MG TABLET    Take 750 mg by mouth daily. Started 03/22 for 10 days   LEVOTHYROXINE (SYNTHROID, LEVOTHROID) 112 MCG TABLET    Take 112 mcg by mouth daily before breakfast.   LORATADINE (CLARITIN) 10 MG TABLET    Take 10 mg by mouth daily.   MAGNESIUM HYDROXIDE (MILK OF MAGNESIA) 400 MG/5ML SUSPENSION    Take 30  mLs by mouth daily as needed for mild constipation or moderate constipation.   MEMANTINE (NAMENDA XR) 28 MG CP24 24 HR CAPSULE    Take 1 capsule (28 mg total) by mouth daily.   NUTRITIONAL SUPPLEMENTS (NUTRITIONAL SUPPLEMENT PO)    Take 60 mLs by mouth 3 (three) times daily.   PROPYLENE GLYCOL (SYSTANE BALANCE) 0.6 % SOLN    Place 1 drop into both eyes 3 (three) times daily.   SACCHAROMYCES BOULARDII (FLORASTOR) 250 MG CAPSULE    Take 250 mg by mouth 2 (two) times daily. Started 03/23 for 3 weeks   TRIAMCINOLONE CREAM (KENALOG) 0.1 %    Apply 1 application topically 2 (two) times daily as needed (for rash).  Modified Medications   No medications on file  Discontinued Medications   No medications on file     SIGNIFICANT DIAGNOSTIC EXAMS  12-30-14: left knee x-ray: modest osteoarthritis  12-30-14: left hip with pelvis x-ray; modest osteoarthritis left hip   12-23-15: ct of head: 1. No acute intracranial abnormality. 2. Atrophy and moderate small vessel ischemic change. 3. High left frontal scalp hematoma. 4. Air-fluid level in the sphenoid sinus. Probable sinusitis.  No evidence of basilar skull fracture.  12-26-15: ct of head: 1. No acute intracranial abnormalities.  No intracranial hemorrhage. 2. Bilateral frontal scalp hematomas, larger on the left. No skull fracture. 3. Moderate atrophy and mild chronic microvascular ischemic change.   12-27-15: chest x-ray: No acute cardiopulmonary process.  Improved aeration of the lingula.      LABS REVIEWED:   04-06-15: wbc 8.6; hgb 14.6; hct 46.0; mcv 90.9; plt 293; glucose 102; bun 32.2; creat 1.25; k+4.2; na++142; liver normal albumin 4.3 tsh 3.03 hgb a1c 6.0; chol 144; ldl 72; trig 101; hdl 52  40-9-81: wbc 8.8; hct 14.3; hct 46.8; mcv 94.4; plt 241; glucose 133; bun 27.4; mcv 1.54; k+ 4.8; na++142; liver normal albumin 4.0; tsh 7.83  12-22-15: wbc 16.0; hgb 15.6; hvt 50.3; mcv 94.8; plt 332; glucose 104; bun 44.9; creat 2.0; k+ 4.4;  na++153 12-24-15: glucose 99; bun 51.7; creat 2.33; k+ 4.7; na++ 154 12-26-15: glucose 69; bun 43; creat 1.81; k+ 4.0; na++153 12-27-15: glucose 98; bun 3.78; creat 1.97; k+ 3.9; na++155  12-27-15: (ED) wbc 14.5; hgb 13.9; hct 43.3; mcv 95.8; plt 252; glucose 105; bun 39; creat 1.97; k+ 4.1; na++152; liver normal albumin 2.9     Review of Systems  Unable to perform ROS: dementia      Physical Exam Constitutional: No distress.  Eyes: Conjunctivae are normal.  Neck: Neck supple. No JVD present. No thyromegaly present.  Cardiovascular: Normal rate, regular rhythm and intact distal pulses.   Respiratory: Effort normal and breath sounds normal. No respiratory distress. She has no wheezes.  GI: Soft. Bowel sounds are normal. She exhibits no distension. There is no tenderness.  Musculoskeletal: She exhibits no edema.  Able to move all extremities   Lymphadenopathy:    She has no cervical adenopathy.  Neurological: She is alert.  Skin: Skin is warm and dry. She is not diaphoretic.  Has diffuse red rash over body Has fading facial bruising present.  Psychiatric: She has a normal mood and affect.      ASSESSMENT/ PLAN:  1. Dyslipidemia: will continue lipitor 40 mg daily her ldl is 72  2. Hypothyroidism: will continue her synthroid to 112 mcg daily; tsh is 7.83  3. Allergic rhinitis: will continue claritin 10 mg daily  4. TIA: is neurologically stable; will continue asa 325 mg daily   5. Vascular dementia:  will continue namenda xr 28 mg daily will monitor she is failing; her po intake is reduced. She has had numerous falls over the past couple of weeks.   6. Depression: is stable will continue lexapro 5 mg daily   7. CKD stage III: is stable her creat is 1.54; will monitor   8. Pneumonia: will complete her levaquin and will monitor her status.    Will setup a hospice consult    Time spent with patient  50  minutes >50% time spent counseling; reviewing medical record; tests;  labs; and developing future plan of care     Synthia Innocent NP [facility]  Contact [phone] Monday through Friday 8am- 5pm  After hours call [phone]

## 2015-12-30 ENCOUNTER — Encounter: Payer: Self-pay | Admitting: Internal Medicine

## 2015-12-30 ENCOUNTER — Non-Acute Institutional Stay (SKILLED_NURSING_FACILITY): Payer: Medicare Other | Admitting: Internal Medicine

## 2015-12-30 DIAGNOSIS — F329 Major depressive disorder, single episode, unspecified: Secondary | ICD-10-CM | POA: Diagnosis not present

## 2015-12-30 DIAGNOSIS — F0150 Vascular dementia without behavioral disturbance: Secondary | ICD-10-CM

## 2015-12-30 DIAGNOSIS — N184 Chronic kidney disease, stage 4 (severe): Secondary | ICD-10-CM | POA: Diagnosis not present

## 2015-12-30 DIAGNOSIS — E870 Hyperosmolality and hypernatremia: Secondary | ICD-10-CM | POA: Diagnosis not present

## 2015-12-30 DIAGNOSIS — F0393 Unspecified dementia, unspecified severity, with mood disturbance: Secondary | ICD-10-CM

## 2015-12-30 DIAGNOSIS — E038 Other specified hypothyroidism: Secondary | ICD-10-CM | POA: Diagnosis not present

## 2015-12-30 DIAGNOSIS — E034 Atrophy of thyroid (acquired): Secondary | ICD-10-CM

## 2015-12-30 DIAGNOSIS — R21 Rash and other nonspecific skin eruption: Secondary | ICD-10-CM

## 2015-12-30 DIAGNOSIS — J189 Pneumonia, unspecified organism: Secondary | ICD-10-CM

## 2015-12-30 DIAGNOSIS — F0280 Dementia in other diseases classified elsewhere without behavioral disturbance: Secondary | ICD-10-CM

## 2015-12-30 DIAGNOSIS — Z515 Encounter for palliative care: Secondary | ICD-10-CM

## 2015-12-30 NOTE — Progress Notes (Signed)
MRN: [id number] Name: Kristie Gonzales  Sex: female Age: 82 y.o. DOB: 11/29/1925  [facility] #: [facility] Facility/Room: Level Of Care: SNF Provider: Merrilee Seashore D Emergency Contacts: Extended Emergency Contact Information Primary Emergency Contact: [name] [place] Home Phone: [phone] Relation: Daughter  Code Status:   Allergies: Sulfonamide derivatives  Chief Complaint  Patient presents with  . Readmit To SNF    HPI: Patient is 82 y.o. female with alzheimers dementia, HTN, hypothyroidism sent from SNF for hypernatremia. She is non-verbal and history is obtained from ER doctor who obtained it from EMS. She was in the ER for this yesterday. She has been given 1/2 NS boluses over the past couple days but sodium has not improved. She is being admitted for hypernatremia from 3/27-28. Pt's hypernatremia was not corrected but daughter realized/remembered what her mother had wanted for end f life. Therefore pt is admiited to SNF for comfort care, with no more hospitalizations, no labs, no IV's and a Hospice consult. While at SNF pt will be followed for dementia, tx with namenda, hypothyroidism, tx with synthroid and skin rash, tx with triamcinolone.  Past Medical History  Diagnosis Date  . Thyroid disease   . Hypertension   . Pneumonia 04/19/12  . Hypothyroidism   . Dementia     Past Surgical History  Procedure Laterality Date  . Abdominal hysterectomy    . Appendectomy        Medication List       This list is accurate as of: 12/30/15 11:59 PM.  Always use your most recent med list.               acetaminophen 325 MG tablet  Commonly known as:  TYLENOL  Take 650 mg by mouth every 4 (four) hours as needed (for pain).     aspirin 325 MG tablet  Take 1 tablet (325 mg total) by mouth daily.     atorvastatin 40 MG tablet  Commonly known as:  LIPITOR  Take 1 tablet (40 mg total) by mouth daily at 6 PM.     escitalopram 5 MG tablet  Commonly  known as:  LEXAPRO  Take 1 tablet (5 mg total) by mouth daily.     levofloxacin 750 MG tablet  Commonly known as:  LEVAQUIN  Take 750 mg by mouth daily. Started 03/22 for 10 days     levothyroxine 112 MCG tablet  Commonly known as:  SYNTHROID, LEVOTHROID  Take 112 mcg by mouth daily before breakfast.     loratadine 10 MG tablet  Commonly known as:  CLARITIN  Take 10 mg by mouth daily.     memantine 28 MG Cp24 24 hr capsule  Commonly known as:  NAMENDA XR  Take 1 capsule (28 mg total) by mouth daily.     MILK OF MAGNESIA 400 MG/5ML suspension  Generic drug:  magnesium hydroxide  Take 30 mLs by mouth daily as needed for mild constipation or moderate constipation.     NUTRITIONAL SUPPLEMENT PO  Take 60 mLs by mouth 3 (three) times daily.     saccharomyces boulardii 250 MG capsule  Commonly known as:  FLORASTOR  Take 250 mg by mouth 2 (two) times daily. Started 03/23 for 3 weeks     SYSTANE BALANCE 0.6 % Soln  Generic drug:  Propylene Glycol  Place 1 drop into both eyes 3 (three) times daily.     triamcinolone cream 0.1 %  Commonly known as:  KENALOG  Apply  1 application topically 2 (two) times daily as needed (for rash).        No orders of the defined types were placed in this encounter.    Immunization History  Administered Date(s) Administered  . Influenza-Unspecified 06/16/2014, 07/23/2015  . PPD Test 11/03/2014, 11/17/2014  . Pneumococcal-Unspecified 06/16/2014    Social History  Substance Use Topics  . Smoking status: Never Smoker   . Smokeless tobacco: Not on file  . Alcohol Use: No    Family history is + HD, DM2  Review of Systems  UTO 2/2 dementia; per nursing-no concerns    Filed Vitals:   12/30/15 1333  BP: 144/64  Pulse: 88  Temp: 97.8 F (36.6 C)  Resp: 20    SpO2 Readings from Last 1 Encounters:  12/30/15 97%        Physical Exam  GENERAL APPEARANCE: Alert, non conversant,  No acute distress; sitting in WC in hall SKIN:   Rash - improved HEAD: Normocephalic, atraumatic  EYES: Conjunctiva/lids clear. Pupils round, reactive. EOMs intact.  EARS: External exam WNL, canals clear. Hearing grossly normal.  NOSE: No deformity or discharge.  MOUTH/THROAT: Lips w/o lesions  RESPIRATORY: Breathing is even, unlabored. Lung sounds are clear   CARDIOVASCULAR: Heart RRR no murmurs, rubs or gallops. No peripheral edema.   GASTROINTESTINAL: Abdomen is soft, non-tender, not distended w/ normal bowel sounds. GENITOURINARY: Bladder non tender, not distended  MUSCULOSKELETAL: No abnormal joints or musculature NEUROLOGIC:  Cranial nerves 2-12 grossly intact. Moves all extremities  PSYCHIATRIC: dementia, no behavioral issues  Patient Active Problem List   Diagnosis Date Noted  . Hospice care 01/01/2016  . HCAP (healthcare-associated pneumonia) 12/29/2015  . Dehydration   . Hypernatremia 12/27/2015  . Cellulitis 12/06/2015  . Rash and nonspecific skin eruption 11/13/2015  . Hyperlipidemia 07/29/2015  . Vascular dementia without behavioral disturbance 05/11/2015  . Depression due to dementia 05/11/2015  . Allergic rhinitis 11/26/2014  . TIA (transient ischemic attack) 10/31/2014  . Hypothyroidism 10/31/2014  . CKD (chronic kidney disease) stage 4, GFR 15-29 ml/min (HCC) 10/31/2014    CBC    Component Value Date/Time   WBC 11.0* 12/28/2015 0828   WBC 10.7 12/01/2015   WBC 12.3* 05/12/2014 1334   RBC 3.95 12/28/2015 0828   RBC 4.92 05/12/2014 1334   HGB 12.2 12/28/2015 0828   HGB 14.8 05/12/2014 1334   HCT 37.9 12/28/2015 0828   HCT 46.1 05/12/2014 1334   PLT 254 12/28/2015 0828   MCV 95.9 12/28/2015 0828   MCV 93.8 05/12/2014 1334   LYMPHSABS 2.1 12/27/2015 1604   MONOABS 1.1* 12/27/2015 1604   EOSABS 3.3* 12/27/2015 1604   BASOSABS 0.1 12/27/2015 1604    CMP     Component Value Date/Time   NA 151* 12/28/2015 0804   NA 153* 12/24/2015   K 3.6 12/28/2015 0804   CL 119* 12/28/2015 0804   CO2 23  12/28/2015 0804   GLUCOSE 121* 12/28/2015 0804   BUN 29* 12/28/2015 0804   BUN 43* 12/24/2015   CREATININE 1.70* 12/28/2015 0804   CREATININE 1.8* 12/24/2015   CALCIUM 8.0* 12/28/2015 0804   PROT 6.4* 12/27/2015 1604   ALBUMIN 2.9* 12/27/2015 1604   AST 21 12/27/2015 1604   ALT 16 12/27/2015 1604   ALKPHOS 117 12/27/2015 1604   BILITOT 0.5 12/27/2015 1604   GFRNONAA 26* 12/28/2015 0804   GFRAA 30* 12/28/2015 0804    Lab Results  Component Value Date   HGBA1C 5.9* 10/31/2014  Dg Chest Port 1 View  12/27/2015  CLINICAL DATA:  Recent falls with hypernatremia. EXAM: PORTABLE CHEST 1 VIEW COMPARISON:  05/12/2014 and 05/17/2012. FINDINGS: 1517 hours. Patient is mildly rotated, and the mandible overlies the lung apices. The aeration of the lingula has improved. The lungs are otherwise clear. There is no edema, pleural effusion or pneumothorax. The heart size and mediastinal contours are stable. The subacromial space of the right shoulder is mildly narrowed. IMPRESSION: No acute cardiopulmonary process.  Improved aeration of the lingula. Electronically Signed   By: Carey BullocksWilliam  Veazey M.D.   On: 12/27/2015 15:30    Not all labs, radiology exams or other studies done during hospitalization come through on my EPIC note; however they are reviewed by me.    Assessment and Plan  Hypernatremia poor PO intake - sodium has only come down slightly with D5W- ate 50 % of breakfast- almost 500 cc of fluids drank- have asked RNs to feed her.  SNF - comfort care; po's as able  CKD (chronic kidney disease) stage 4, GFR 15-29 ml/min (HCC) SNF - 29/1.7   with CrCl 26; kidney fx declined to stage 4  Rash and nonspecific skin eruption SNF - will cont triamcinilone cream ;it has helped and is comfort issue  Vascular dementia without behavioral disturbance SNF - would continue namenda to preventr decompensation-comfort issue  Depression due to dementia SNF - cont lexapro to prevent  decompensation-comfort issue  HCAP (healthcare-associated pneumonia) SNF cont levaquin intil 3/31 then no mre antibiotics  Hypothyroidism SNF - cont synthroid 112 mcg  Hospice care SNF - has been consulted; i presume non comfort non essentia meds will be stopped   Time spent > 45 min;> 50% of time with patient was spent reviewing records, labs, tests and studies, counseling and developing plan of care  Margit HanksALEXANDER, Saoirse Legere D, MD

## 2016-01-01 ENCOUNTER — Encounter: Payer: Self-pay | Admitting: Internal Medicine

## 2016-01-01 DIAGNOSIS — Z515 Encounter for palliative care: Secondary | ICD-10-CM | POA: Insufficient documentation

## 2016-01-01 NOTE — Assessment & Plan Note (Signed)
Pt's 1/2 NS had kept her Na+ from getting worse and BUN/CR are a little better but as always the K+ will need repleting now which will require IV K+ and her free water defecit will require D5W which will require labs and a nursing staff not available at this SNF at this time. I spoke with DON and pt will go to hospital for safe reduction of Na+ . Nursing to call family.

## 2016-01-01 NOTE — Assessment & Plan Note (Addendum)
SNF - 29/1.7   with CrCl 26; kidney fx declined to stage 4

## 2016-01-01 NOTE — Assessment & Plan Note (Signed)
SNF - will cont triamcinilone cream ;it has helped and is comfort issue

## 2016-01-01 NOTE — Assessment & Plan Note (Signed)
SNF - cont lexapro to prevent decompensation-comfort issue

## 2016-01-01 NOTE — Assessment & Plan Note (Signed)
SNF - cont synthroid 112 mcg

## 2016-01-01 NOTE — Assessment & Plan Note (Signed)
SNF - would continue namenda to preventr decompensation-comfort issue

## 2016-01-01 NOTE — Assessment & Plan Note (Signed)
Rash is ongoing, cellulitis is improved with doxycycline. The order i wrote last visit for wound care has not bEEN ADDRESSED AT all. Wound care order written AGAIN and put into the hands of the DON. At wound care a biopsy can be done and get a dx that way. Trying to get to derm is impossible.

## 2016-01-01 NOTE — Assessment & Plan Note (Signed)
SNF cont levaquin intil 3/31 then no mre antibiotics

## 2016-01-01 NOTE — Progress Notes (Signed)
MRN: [id number] Name: Kristie Gonzales  Sex: female Age: 82 y.o. DOB: 02/26/1926  [facility] Facility[facility]: Level Of Care: SNF Provider: Merrilee SeashoreALEXANDER, Shuaib Corsino D Emergency Contacts: Extended Emergency Contact Information Primary Emergency Contact: Coralyn PearHarris,June  [place] Home Phone: [phone] Relation: Daughter  Code Status:   Allergies: Sulfonamide derivatives  Chief Complaint  Patient presents with  . Acute Visit    HPI: Patient is 82 y.o. female who   Past Medical History  Diagnosis Date  . Thyroid disease   . Hypertension   . Pneumonia 04/19/12  . Hypothyroidism   . Dementia     Past Surgical History  Procedure Laterality Date  . Abdominal hysterectomy    . Appendectomy        Medication List       This list is accurate as of: 12/23/15 11:59 PM.  Always use your most recent med list.               aspirin 325 MG tablet  Take 1 tablet (325 mg total) by mouth daily.     atorvastatin 40 MG tablet  Commonly known as:  LIPITOR  Take 1 tablet (40 mg total) by mouth daily at 6 PM.     escitalopram 5 MG tablet  Commonly known as:  LEXAPRO  Take 1 tablet (5 mg total) by mouth daily.     levothyroxine 112 MCG tablet  Commonly known as:  SYNTHROID, LEVOTHROID  Take 112 mcg by mouth daily before breakfast.     loratadine 10 MG tablet  Commonly known as:  CLARITIN  Take 10 mg by mouth daily.     memantine 28 MG Cp24 24 hr capsule  Commonly known as:  NAMENDA XR  Take 1 capsule (28 mg total) by mouth daily.     MILK OF MAGNESIA 400 MG/5ML suspension  Generic drug:  magnesium hydroxide  Take 30 mLs by mouth daily as needed for mild constipation or moderate constipation.     NUTRITIONAL SUPPLEMENT PO  Take 60 mLs by mouth 3 (three) times daily.        No orders of the defined types were placed in this encounter.    Immunization History  Administered Date(s) Administered  . Influenza-Unspecified 06/16/2014, 07/23/2015  . PPD  Test 11/03/2014, 11/17/2014  . Pneumococcal-Unspecified 06/16/2014    Social History  Substance Use Topics  . Smoking status: Never Smoker   . Smokeless tobacco: Not on file  . Alcohol Use: No    Review of Systems  DATA OBTAINED: from patient, nurse, medical record, family member GENERAL:  no fevers, fatigue, appetite changes SKIN: No itching, rash HEENT: No complaint RESPIRATORY: No cough, wheezing, SOB CARDIAC: No chest pain, palpitations, lower extremity edema  GI: No abdominal pain, No N/V/D or constipation, No heartburn or reflux  GU: No dysuria, frequency or urgency, or incontinence  MUSCULOSKELETAL: No unrelieved bone/joint pain NEUROLOGIC: No headache, dizziness  PSYCHIATRIC: No overt anxiety or sadness  Filed Vitals:   01/01/16 1848  BP: 144/64  Pulse: 88  Temp: 97.8 F (36.6 C)  Resp: 20    Physical Exam  GENERAL APPEARANCE: Alert, conversant, No acute distress  SKIN: No diaphoresis rash, or wounds HEENT: Unremarkable RESPIRATORY: Breathing is even, unlabored. Lung sounds are clear   CARDIOVASCULAR: Heart RRR no murmurs, rubs or gallops. No peripheral edema  GASTROINTESTINAL: Abdomen is soft, non-tender, not distended w/ normal bowel sounds.  GENITOURINARY: Bladder non tender, not distended  MUSCULOSKELETAL: No abnormal joints or  musculature NEUROLOGIC: Cranial nerves 2-12 grossly intact. Moves all extremities PSYCHIATRIC: Mood and affect appropriate to situation, no behavioral issues  Patient Active Problem List   Diagnosis Date Noted  . HCAP (healthcare-associated pneumonia) 12/29/2015  . Dehydration   . Hypernatremia 12/27/2015  . Cellulitis 12/06/2015  . Rash and nonspecific skin eruption 11/13/2015  . Hyperlipidemia 07/29/2015  . Vascular dementia without behavioral disturbance 05/11/2015  . Depression due to dementia 05/11/2015  . Allergic rhinitis 11/26/2014  . TIA (transient ischemic attack) 10/31/2014  . Hypothyroidism 10/31/2014  . CKD  (chronic kidney disease) stage 3, GFR 30-59 ml/min 10/31/2014    CBC    Component Value Date/Time   WBC 11.0* 12/28/2015 0828   WBC 10.7 12/01/2015   WBC 12.3* 05/12/2014 1334   RBC 3.95 12/28/2015 0828   RBC 4.92 05/12/2014 1334   HGB 12.2 12/28/2015 0828   HGB 14.8 05/12/2014 1334   HCT 37.9 12/28/2015 0828   HCT 46.1 05/12/2014 1334   PLT 254 12/28/2015 0828   MCV 95.9 12/28/2015 0828   MCV 93.8 05/12/2014 1334   LYMPHSABS 2.1 12/27/2015 1604   MONOABS 1.1* 12/27/2015 1604   EOSABS 3.3* 12/27/2015 1604   BASOSABS 0.1 12/27/2015 1604    CMP     Component Value Date/Time   NA 151* 12/28/2015 0804   NA 153* 12/24/2015   K 3.6 12/28/2015 0804   CL 119* 12/28/2015 0804   CO2 23 12/28/2015 0804   GLUCOSE 121* 12/28/2015 0804   BUN 29* 12/28/2015 0804   BUN 43* 12/24/2015   CREATININE 1.70* 12/28/2015 0804   CREATININE 1.8* 12/24/2015   CALCIUM 8.0* 12/28/2015 0804   PROT 6.4* 12/27/2015 1604   ALBUMIN 2.9* 12/27/2015 1604   AST 21 12/27/2015 1604   ALT 16 12/27/2015 1604   ALKPHOS 117 12/27/2015 1604   BILITOT 0.5 12/27/2015 1604   GFRNONAA 26* 12/28/2015 0804   GFRAA 30* 12/28/2015 0804    Assessment and Plan  No problem-specific assessment & plan notes found for this encounter.   Margit Hanks, MD     This encounter was created in error - please disregard.

## 2016-01-01 NOTE — Progress Notes (Signed)
MRN: [id number] Name: Kristie Gonzales  Sex: female Age: 82 y.o. DOB: Nov 28, 1925  PSC #: Ronni Rumble Facility/Room: Level Of Care: SNF Provider: Merrilee Seashore D Emergency Contacts: Extended Emergency Contact Information Primary Emergency Contact: Kristie Gonzales [place] of [place] Home Phone: [phone] Relation: Daughter  Code Status:   Allergies: Sulfonamide derivatives  Chief Complaint  Patient presents with  . Acute Visit    HPI: Patient is 82 y.o. female with HTN and dementia who is being seen today for elevated NA+.. Pt was seen initially 3/23 with Na+ 153, and D5 1/2 NS was ordered, many liters at 75cc/hr. Pt was awake and eating and drinking at that time . Today pt's Na+ is 155. Pt is much less responsive today. No fevers  Urinary sx or coughing. Skin rash actually looks better.  Past Medical History  Diagnosis Date  . Thyroid disease   . Hypertension   . Pneumonia 04/19/12  . Hypothyroidism   . Dementia     Past Surgical History  Procedure Laterality Date  . Abdominal hysterectomy    . Appendectomy        Medication List    Notice    This visit is on the same day as an admission, and a visit start time could not be determined. If the visit took place after discharge, manually review the med list with the patient.      No orders of the defined types were placed in this encounter.    Immunization History  Administered Date(s) Administered  . Influenza-Unspecified 06/16/2014, 07/23/2015  . PPD Test 11/03/2014, 11/17/2014  . Pneumococcal-Unspecified 06/16/2014    Social History  Substance Use Topics  . Smoking status: Never Smoker   . Smokeless tobacco: Not on file  . Alcohol Use: No    Review of Systems  UTO 2/2 dementia; nursing - as per HPI; also as i wa seeing pt with nurse she noted that no IV had been started on her yesterday so she could not have gotten the fluids ordered yesterday. Also pt's MS is much more depressed.    Filed  Vitals:   01/01/16 1906  BP: 144/64  Pulse: 88  Temp: 97.8 F (36.6 C)  Resp: 20    Physical Exam  GENERAL APPEARANCE: somnalent non conversant, No acute distress  SKIN: No diaphoresis rash HEENT: Unremarkable RESPIRATORY: Breathing is even, unlabored. Lung sounds are clear   CARDIOVASCULAR: Heart RRR no murmurs, rubs or gallops. No peripheral edema  GASTROINTESTINAL: Abdomen is soft, non-tender, not distended w/ normal bowel sounds.  GENITOURINARY: Bladder non tender, not distended  MUSCULOSKELETAL: No abnormal joints or musculature NEUROLOGIC: Cranial nerves 2-12 grossly intact. Moves all extremities PSYCHIATRIC: dementia, no behavioral issues  Patient Active Problem List   Diagnosis Date Noted  . HCAP (healthcare-associated pneumonia) 12/29/2015  . Dehydration   . Hypernatremia 12/27/2015  . Cellulitis 12/06/2015  . Rash and nonspecific skin eruption 11/13/2015  . Hyperlipidemia 07/29/2015  . Vascular dementia without behavioral disturbance 05/11/2015  . Depression due to dementia 05/11/2015  . Allergic rhinitis 11/26/2014  . TIA (transient ischemic attack) 10/31/2014  . Hypothyroidism 10/31/2014  . CKD (chronic kidney disease) stage 3, GFR 30-59 ml/min 10/31/2014    CBC    Component Value Date/Time   WBC 11.0* 12/28/2015 0828   WBC 10.7 12/01/2015   WBC 12.3* 05/12/2014 1334   RBC 3.95 12/28/2015 0828   RBC 4.92 05/12/2014 1334   HGB 12.2 12/28/2015 0828   HGB 14.8 05/12/2014 1334   HCT  37.9 12/28/2015 0828   HCT 46.1 05/12/2014 1334   PLT 254 12/28/2015 0828   MCV 95.9 12/28/2015 0828   MCV 93.8 05/12/2014 1334   LYMPHSABS 2.1 12/27/2015 1604   MONOABS 1.1* 12/27/2015 1604   EOSABS 3.3* 12/27/2015 1604   BASOSABS 0.1 12/27/2015 1604    CMP     Component Value Date/Time   NA 151* 12/28/2015 0804   NA 153* 12/24/2015   K 3.6 12/28/2015 0804   CL 119* 12/28/2015 0804   CO2 23 12/28/2015 0804   GLUCOSE 121* 12/28/2015 0804   BUN 29* 12/28/2015 0804    BUN 43* 12/24/2015   CREATININE 1.70* 12/28/2015 0804   CREATININE 1.8* 12/24/2015   CALCIUM 8.0* 12/28/2015 0804   PROT 6.4* 12/27/2015 1604   ALBUMIN 2.9* 12/27/2015 1604   AST 21 12/27/2015 1604   ALT 16 12/27/2015 1604   ALKPHOS 117 12/27/2015 1604   BILITOT 0.5 12/27/2015 1604   GFRNONAA 26* 12/28/2015 0804   GFRAA 30* 12/28/2015 0804  3/23  153, 4.4, 114, 27, 44.9/2.10  3/27  155, 3.9, 117, 21, 37.8/1.97  Assessment and Plan  Hypernatremia Pt's 1/2 NS had kept her Na+ from getting worse and BUN/CR are a little better but as always the K+ will need repleting now which will require IV K+ and her free water defecit will require D5W which will require labs and a nursing staff not available at this SNF at this time. I spoke with DON and pt will go to hospital for safe reduction of Na+ . Nursing to call family.   Time spent > 35 min;> 50% of time with patient was spent reviewing records, labs, tests and studies, counseling and developing plan of care  Margit HanksALEXANDER, Stefhanie Kachmar D, MD

## 2016-01-01 NOTE — Assessment & Plan Note (Signed)
SNF - has been consulted; i presume non comfort non essentia meds will be stopped

## 2016-01-01 NOTE — Assessment & Plan Note (Signed)
poor PO intake - sodium has only come down slightly with D5W- ate 50 % of breakfast- almost 500 cc of fluids drank- have asked RNs to feed her.  SNF - comfort care; po's as able

## 2016-01-11 DIAGNOSIS — Y92129 Unspecified place in nursing home as the place of occurrence of the external cause: Secondary | ICD-10-CM

## 2016-01-11 DIAGNOSIS — W19XXXA Unspecified fall, initial encounter: Secondary | ICD-10-CM | POA: Insufficient documentation

## 2016-01-31 DEATH — deceased

## 2016-12-03 IMAGING — CT CT HEAD W/O CM
2 series · 16 of 30 positions shown, 18 images · non-contrast
Comparison: CT brain scan of 06/25/2015

CLINICAL DATA: Fell yesterday, now with altered mental status,
bruise on the left forehead

EXAM:
CT HEAD WITHOUT CONTRAST
TECHNIQUE: Contiguous axial images were obtained from the base of the skull
through the vertex without intravenous contrast.

[Series 2: head wo · axial · 0.44mm/px · z∈[+1380,+1501]mm · 8 of 33 slices shown, 10 images]
[im 4/33  brain]
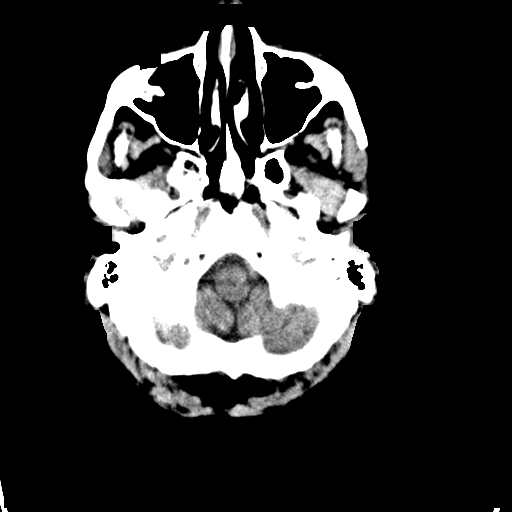
[im 4/33  bone]
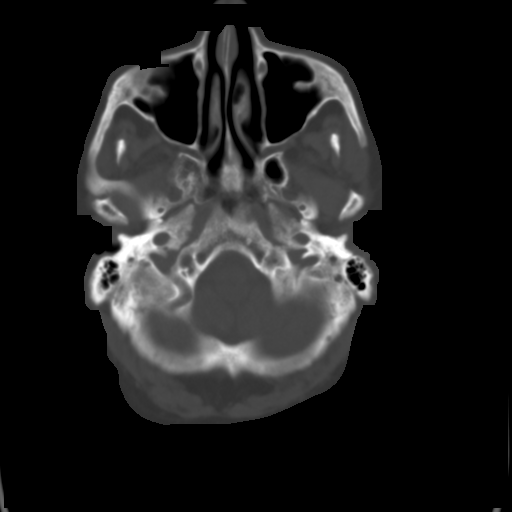
[im 8/33  brain]
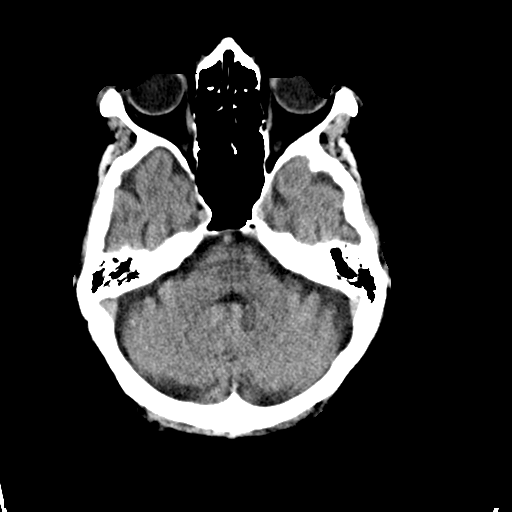
[im 11/33  brain]
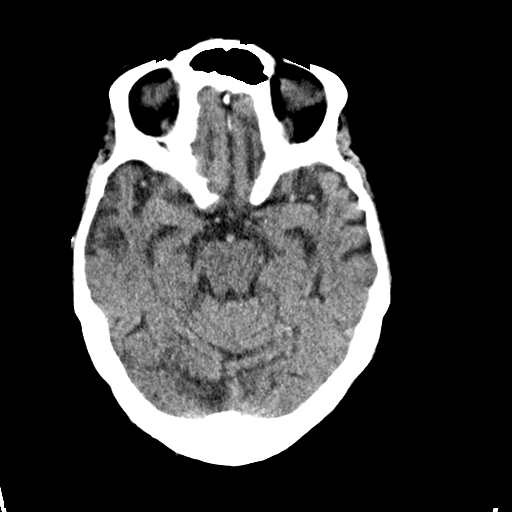
[im 15/33  brain]
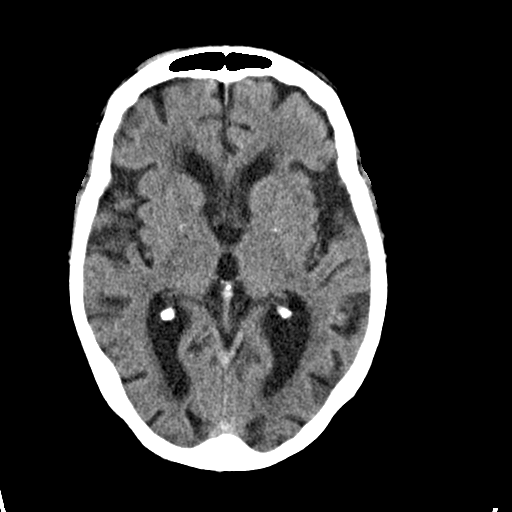
[im 18/33  brain]
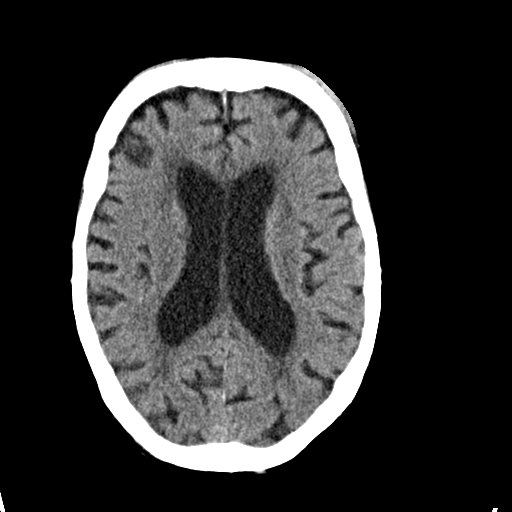
[im 18/33  bone]
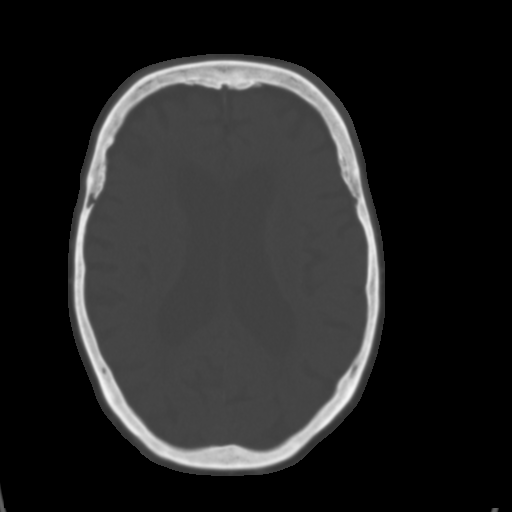
[im 22/33  brain]
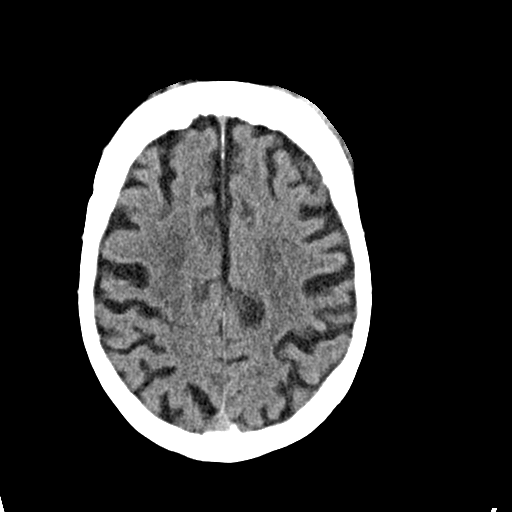
[im 25/33  brain]
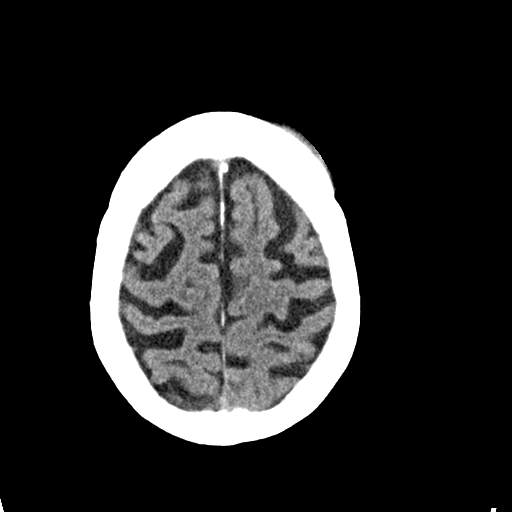
[im 29/33  brain]
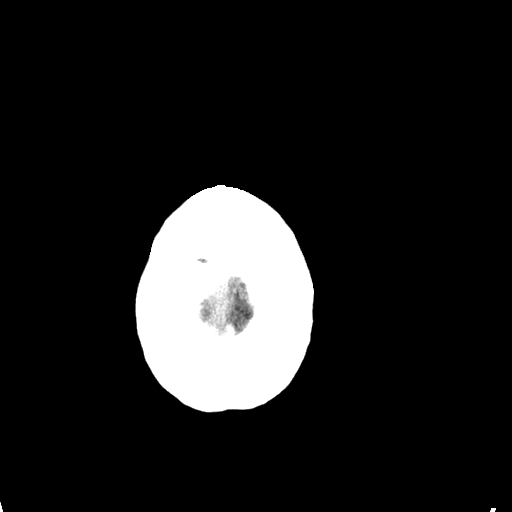

[Series 3: head bone · axial · 0.44mm/px · z∈[+1379,+1505]mm · 8 of 66 slices shown]
[im 7/66  bone]
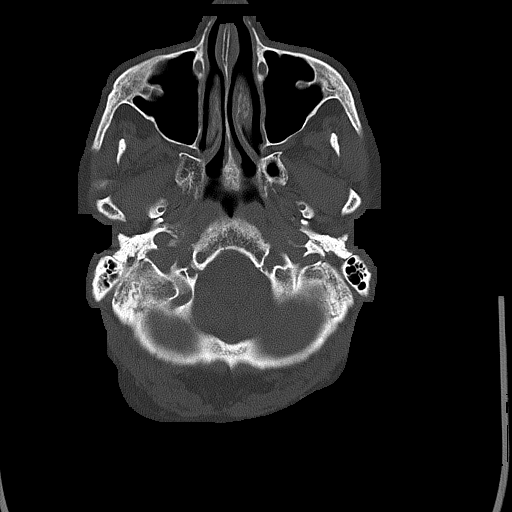
[im 14/66  bone]
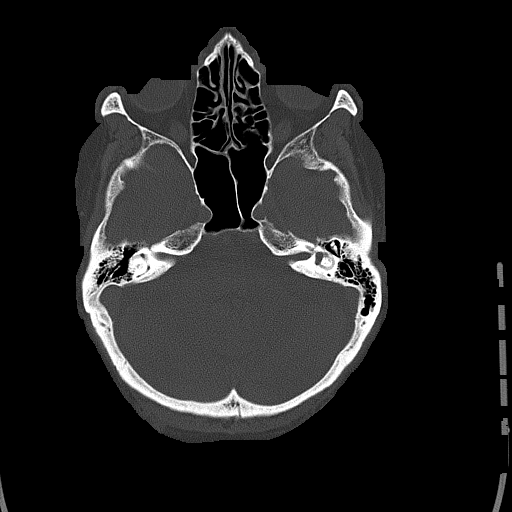
[im 21/66  bone]
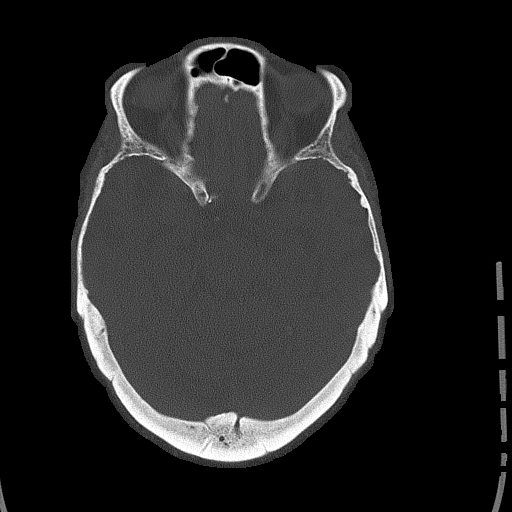
[im 28/66  bone]
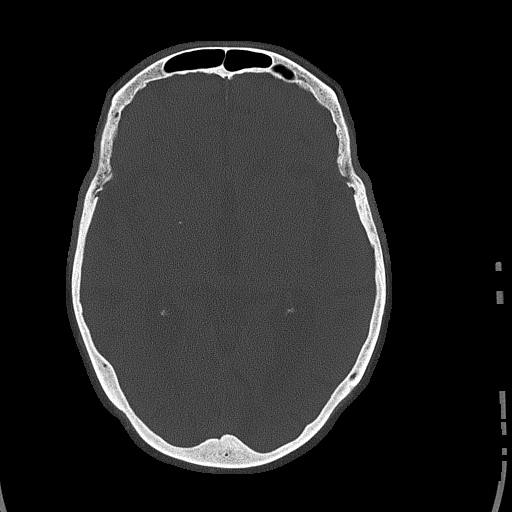
[im 38/66  bone]
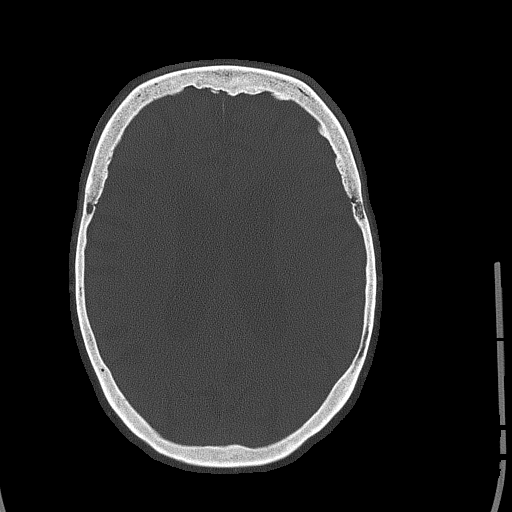
[im 45/66  bone]
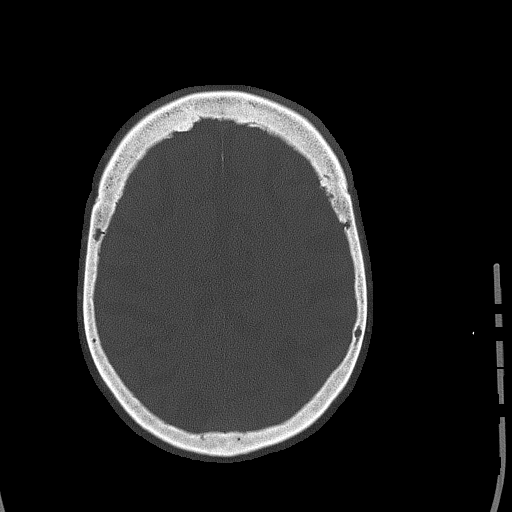
[im 52/66  bone]
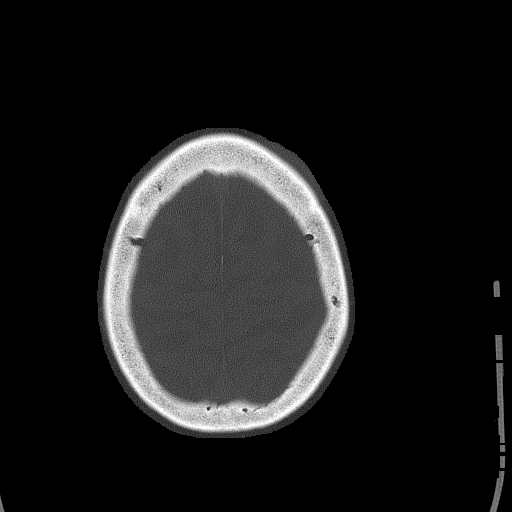
[im 59/66  bone]
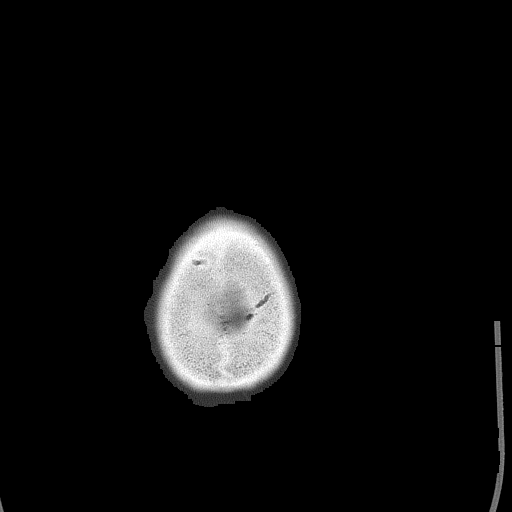

[16 of 30 positions shown; findings below may reference images not displayed]

FINDINGS: The ventricular system is prominent as are the cortical sulci,
consistent with diffuse atrophy. The septum is midline in position.
Moderate small vessel ischemic changes again noted throughout the
periventricular white matter. No hemorrhage, mass lesion, or acute
infarction is seen. On bone window images, there is a high left
frontal scalp hematoma present. No calvarial fracture is seen. In
addition, there is air-fluid level within the sphenoid sinus. This
may represent incidental sphenoid sinus disease with no evidence of
basilar skull fracture by CT.
IMPRESSION: 1. No acute intracranial abnormality.
2. Atrophy and moderate small vessel ischemic change.
3. High left frontal scalp hematoma.
4. Air-fluid level in the sphenoid sinus. Probable sinusitis. No
evidence of basilar skull fracture.

## 2016-12-06 IMAGING — CT CT HEAD W/O CM
3 of 4 series · 17 of 30 positions shown, 19 images · non-contrast
Comparison: 12/23/2015

CLINICAL DATA: FALL FROM HER WHEELCHAIR. today PT HAS A LACERATION
TO THE TOP-RIGHT SIDE OF HER HEAD. PT HAS MULTIPLE OLD BRUISING ALL
OVER FROM PREVIOUS FALLS.

EXAM:
CT HEAD WITHOUT CONTRAST
TECHNIQUE: Contiguous axial images were obtained from the base of the skull
through the vertex without intravenous contrast.

[Series 4: bone windows · axial · 0.45mm/px · z∈[-652,-628]mm · 2 of 51 slices shown]
[im 9/51  bone]
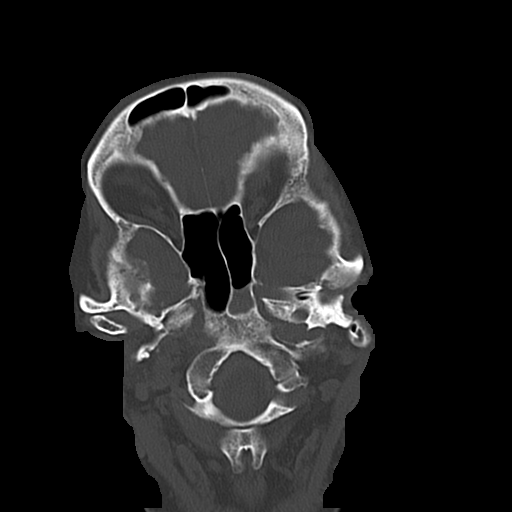
[im 17/51  bone]
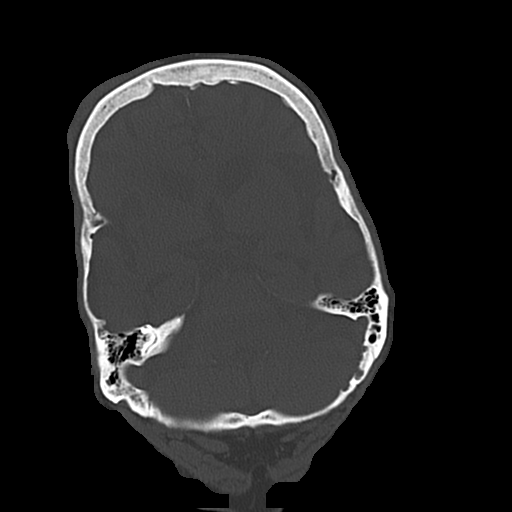

[Series 602: <mpr thick range> · axial · 0.45mm/px · z∈[-661,-539]mm · 8 of 79 slices shown, 10 images]
[im 9/79  brain]
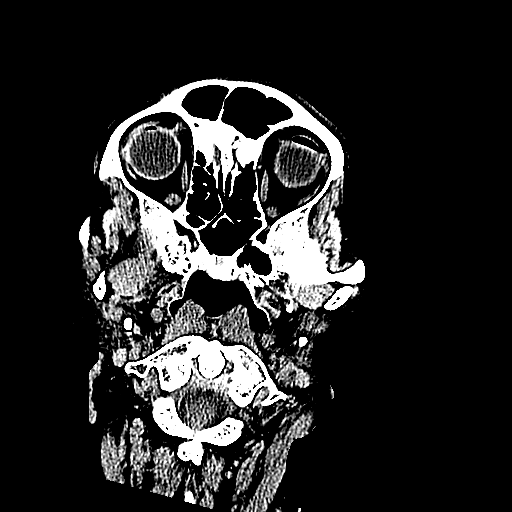
[im 9/79  bone]
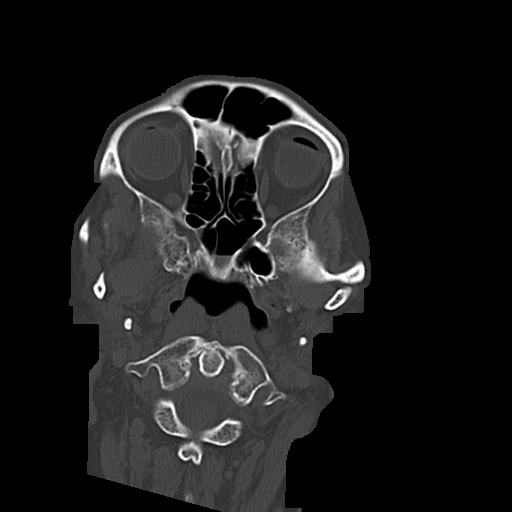
[im 18/79  brain]
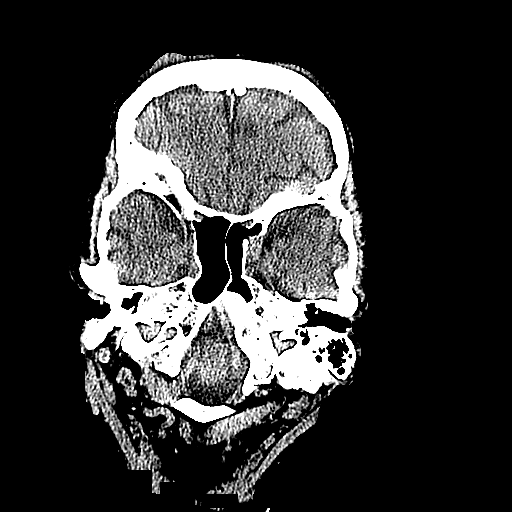
[im 27/79  brain]
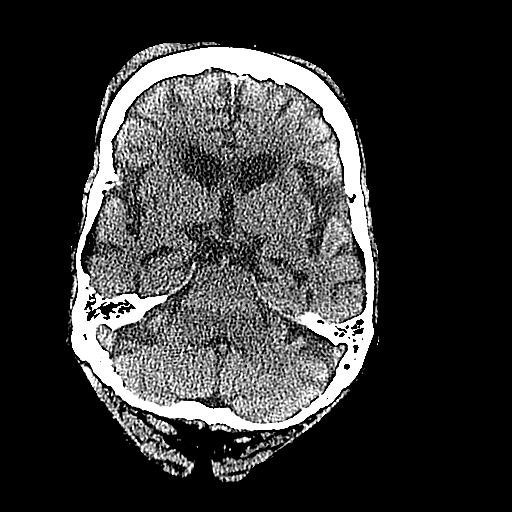
[im 35/79  brain]
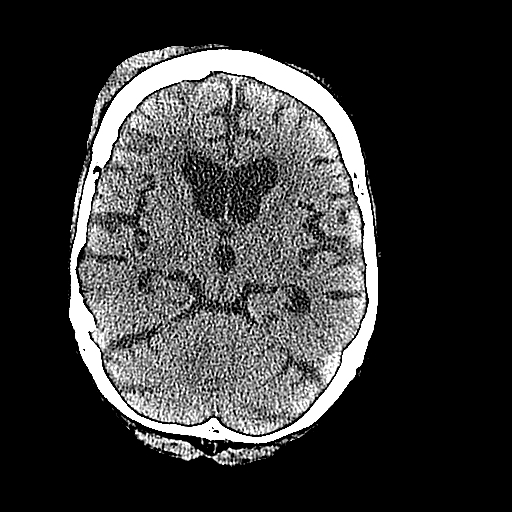
[im 44/79  brain]
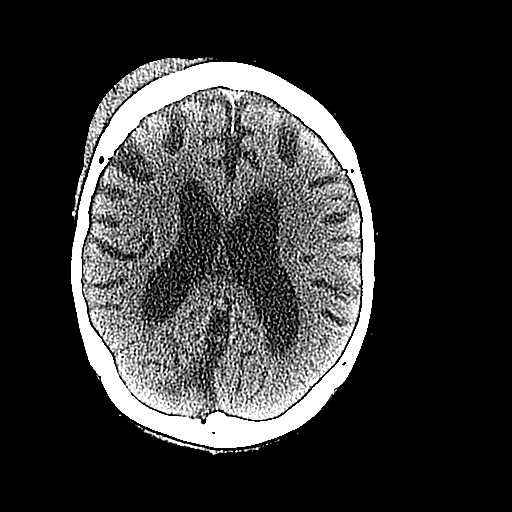
[im 44/79  bone]
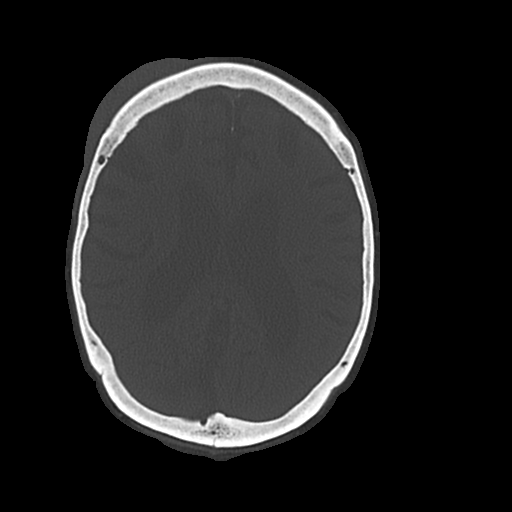
[im 53/79  brain]
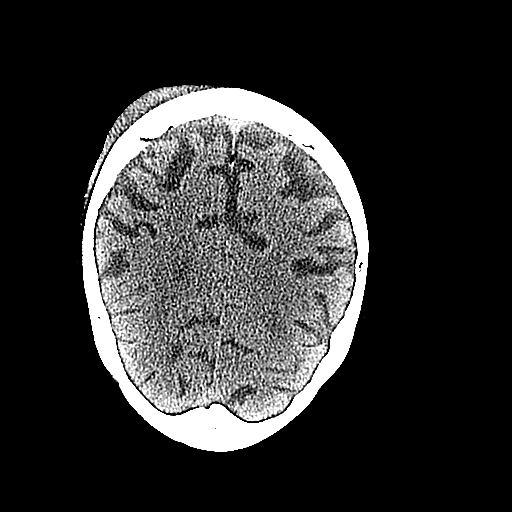
[im 61/79  brain]
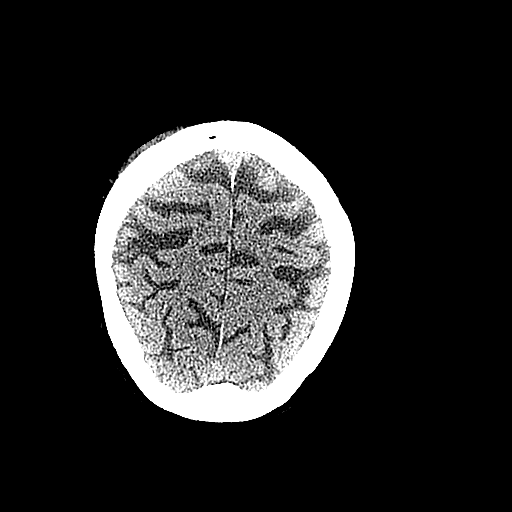
[im 70/79  brain]
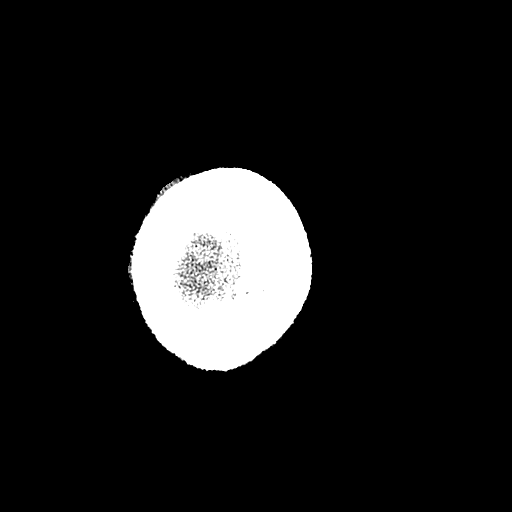

[Series 603: <mpr thick range(1)> · axial · 0.45mm/px · z∈[-674,-571]mm · 7 of 71 slices shown]
[im 9/71  brain]
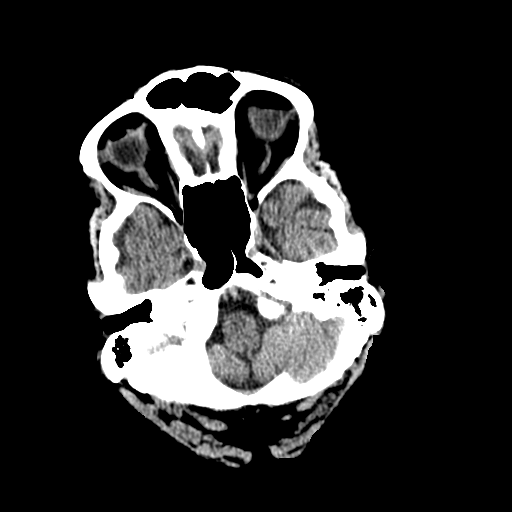
[im 18/71  brain]
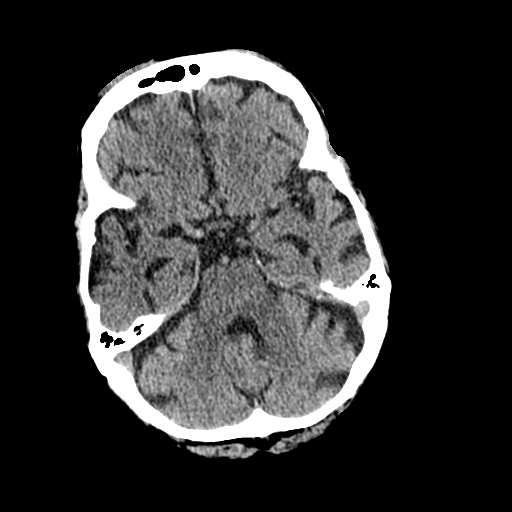
[im 27/71  brain]
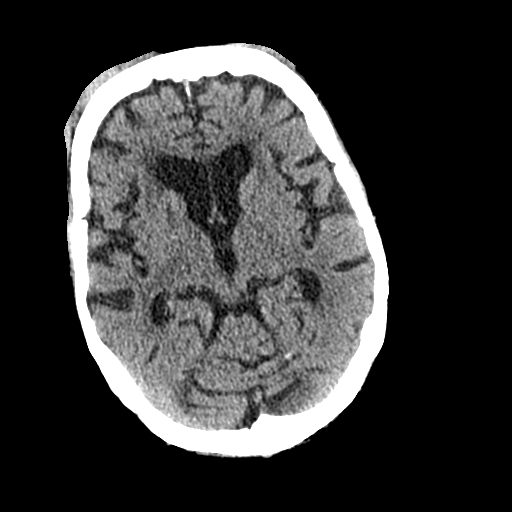
[im 36/71  brain]
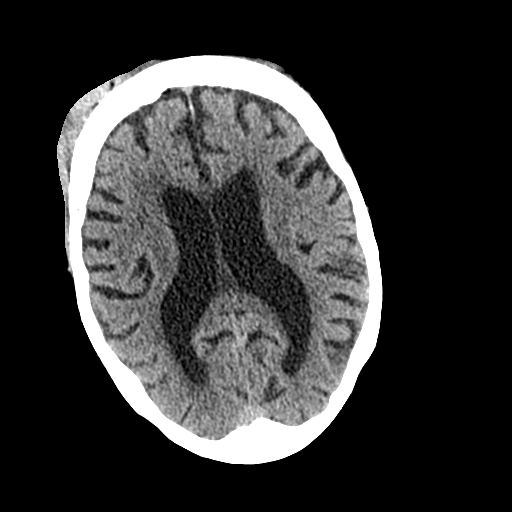
[im 44/71  brain]
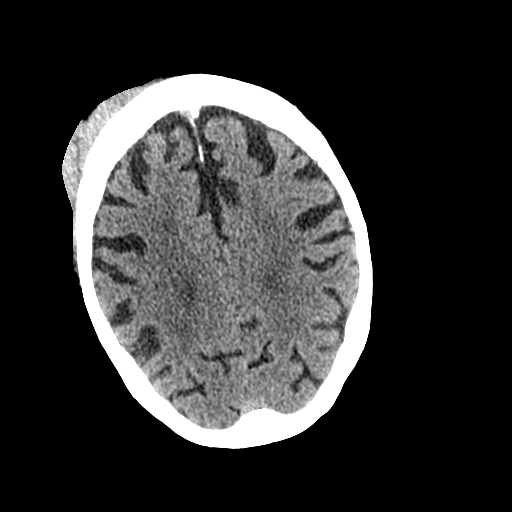
[im 53/71  brain]
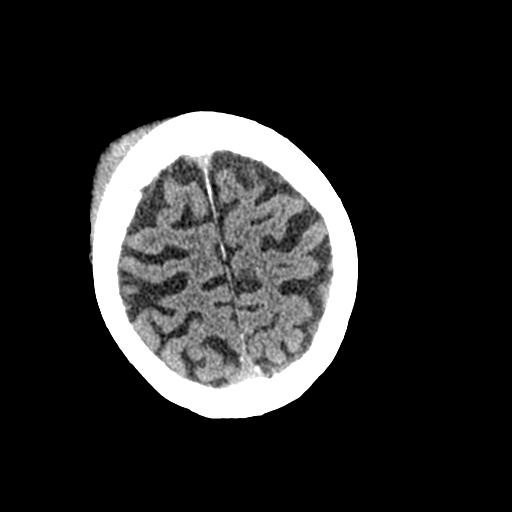
[im 62/71  brain]
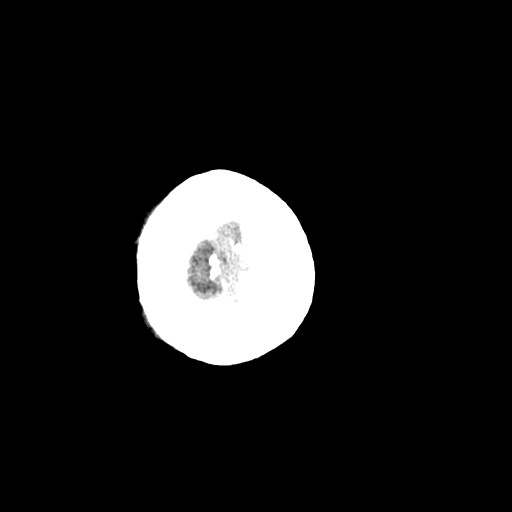

[17 of 30 positions shown; findings below may reference images not displayed]

FINDINGS: The ventricles are normal configuration. There is ventricular and
sulcal enlargement reflecting moderate fused atrophy. There is no
hydrocephalus.

There are no parenchymal masses or mass effect. There is no evidence
of a cortical infarct. White matter hypoattenuation is noted
consistent with mild chronic microvascular ischemic change.

There are no extra-axial masses or abnormal fluid collections.

There is no intracranial hemorrhage.

There are are right larger than left frontal scalp hematomas. No
skull fracture.

Small amount of dependent fluid left sphenoid sinus. Remaining
visualized sinuses and mastoid air cells are clear.
IMPRESSION: 1. No acute intracranial abnormalities.  No intracranial hemorrhage.
2. Bilateral frontal scalp hematomas, larger on the left. No skull
fracture.
3. Moderate atrophy and mild chronic microvascular ischemic change.

## 2016-12-07 IMAGING — DX DG CHEST 1V PORT
1 series · 1 of 1 positions shown · non-contrast
Comparison: 05/12/2014 and 05/17/2012.

CLINICAL DATA: Recent falls with hypernatremia.

EXAM:
PORTABLE CHEST 1 VIEW

[chest ap]
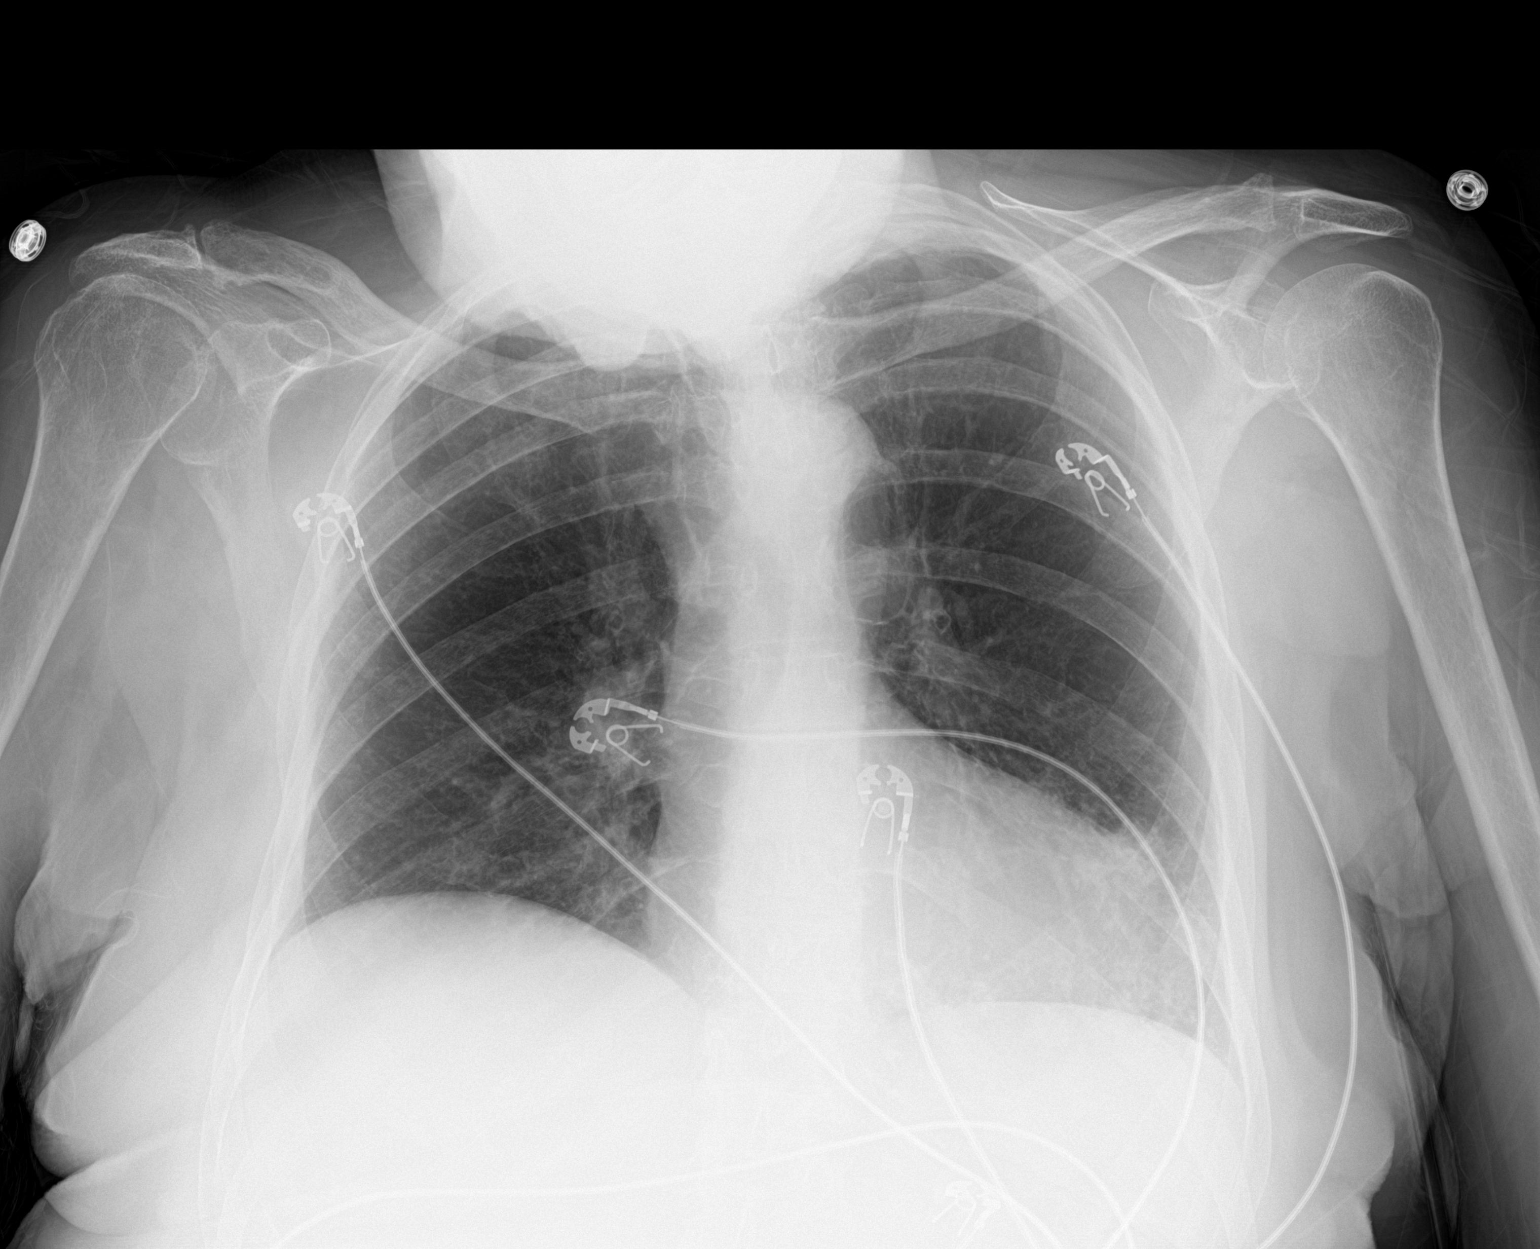

[1 of 1 positions shown; findings below may reference images not displayed]

FINDINGS: 5452 hours. Patient is mildly rotated, and the mandible overlies the
lung apices. The aeration of the lingula has improved. The lungs are
otherwise clear. There is no edema, pleural effusion or
pneumothorax. The heart size and mediastinal contours are stable.
The subacromial space of the right shoulder is mildly narrowed.
IMPRESSION: No acute cardiopulmonary process.  Improved aeration of the lingula.
